# Patient Record
Sex: Female | Born: 1950 | Race: White | Hispanic: No | Marital: Married | Smoking: Former smoker
Health system: Southern US, Community
[De-identification: ages and names within clinical notes are randomized; demographics above are authoritative.]

## PROBLEM LIST (undated history)

## (undated) DIAGNOSIS — M545 Low back pain, unspecified: Secondary | ICD-10-CM

## (undated) DIAGNOSIS — F419 Anxiety disorder, unspecified: Secondary | ICD-10-CM

## (undated) DIAGNOSIS — K219 Gastro-esophageal reflux disease without esophagitis: Secondary | ICD-10-CM

## (undated) DIAGNOSIS — J449 Chronic obstructive pulmonary disease, unspecified: Secondary | ICD-10-CM

## (undated) DIAGNOSIS — I10 Essential (primary) hypertension: Secondary | ICD-10-CM

## (undated) DIAGNOSIS — F329 Major depressive disorder, single episode, unspecified: Secondary | ICD-10-CM

## (undated) DIAGNOSIS — F32A Depression, unspecified: Secondary | ICD-10-CM

## (undated) HISTORY — DX: Low back pain, unspecified: M54.50

## (undated) HISTORY — DX: Essential (primary) hypertension: I10

## (undated) HISTORY — DX: Chronic obstructive pulmonary disease, unspecified: J44.9

## (undated) HISTORY — DX: Low back pain: M54.5

---

## 2008-11-24 ENCOUNTER — Inpatient Hospital Stay (HOSPITAL_COMMUNITY): Admission: EM | Admit: 2008-11-24 | Discharge: 2008-11-30 | Payer: Self-pay | Admitting: Emergency Medicine

## 2009-12-02 DIAGNOSIS — J4489 Other specified chronic obstructive pulmonary disease: Secondary | ICD-10-CM | POA: Insufficient documentation

## 2009-12-02 DIAGNOSIS — R0989 Other specified symptoms and signs involving the circulatory and respiratory systems: Secondary | ICD-10-CM

## 2009-12-02 DIAGNOSIS — J441 Chronic obstructive pulmonary disease with (acute) exacerbation: Secondary | ICD-10-CM

## 2009-12-02 DIAGNOSIS — J449 Chronic obstructive pulmonary disease, unspecified: Secondary | ICD-10-CM | POA: Insufficient documentation

## 2009-12-02 DIAGNOSIS — R0609 Other forms of dyspnea: Secondary | ICD-10-CM | POA: Insufficient documentation

## 2009-12-02 DIAGNOSIS — M545 Low back pain: Secondary | ICD-10-CM | POA: Insufficient documentation

## 2009-12-03 ENCOUNTER — Ambulatory Visit: Payer: Self-pay | Admitting: Internal Medicine

## 2009-12-03 ENCOUNTER — Encounter: Payer: Self-pay | Admitting: Pulmonary Disease

## 2009-12-03 DIAGNOSIS — I10 Essential (primary) hypertension: Secondary | ICD-10-CM

## 2009-12-13 ENCOUNTER — Telehealth (INDEPENDENT_AMBULATORY_CARE_PROVIDER_SITE_OTHER): Payer: Self-pay | Admitting: *Deleted

## 2009-12-17 ENCOUNTER — Encounter: Payer: Self-pay | Admitting: Adult Health

## 2009-12-17 ENCOUNTER — Ambulatory Visit: Payer: Self-pay | Admitting: Internal Medicine

## 2010-01-03 ENCOUNTER — Telehealth (INDEPENDENT_AMBULATORY_CARE_PROVIDER_SITE_OTHER): Payer: Self-pay | Admitting: *Deleted

## 2010-01-06 ENCOUNTER — Ambulatory Visit: Payer: Self-pay | Admitting: Internal Medicine

## 2010-01-20 ENCOUNTER — Ambulatory Visit: Payer: Self-pay | Admitting: Internal Medicine

## 2010-01-25 ENCOUNTER — Telehealth (INDEPENDENT_AMBULATORY_CARE_PROVIDER_SITE_OTHER): Payer: Self-pay | Admitting: *Deleted

## 2010-02-17 ENCOUNTER — Ambulatory Visit: Payer: Self-pay | Admitting: Internal Medicine

## 2010-02-17 ENCOUNTER — Encounter: Payer: Self-pay | Admitting: Pulmonary Disease

## 2010-03-16 ENCOUNTER — Telehealth (INDEPENDENT_AMBULATORY_CARE_PROVIDER_SITE_OTHER): Payer: Self-pay | Admitting: *Deleted

## 2010-03-17 ENCOUNTER — Ambulatory Visit: Payer: Self-pay | Admitting: Internal Medicine

## 2010-04-04 ENCOUNTER — Ambulatory Visit: Payer: Self-pay | Admitting: Internal Medicine

## 2010-04-04 ENCOUNTER — Encounter: Payer: Self-pay | Admitting: Adult Health

## 2010-04-07 ENCOUNTER — Telehealth (INDEPENDENT_AMBULATORY_CARE_PROVIDER_SITE_OTHER): Payer: Self-pay | Admitting: *Deleted

## 2010-04-27 ENCOUNTER — Encounter (HOSPITAL_COMMUNITY)
Admission: RE | Admit: 2010-04-27 | Discharge: 2010-07-26 | Payer: Self-pay | Source: Home / Self Care | Attending: Internal Medicine | Admitting: Internal Medicine

## 2010-05-11 ENCOUNTER — Ambulatory Visit: Payer: Self-pay | Admitting: Internal Medicine

## 2010-05-11 DIAGNOSIS — J9610 Chronic respiratory failure, unspecified whether with hypoxia or hypercapnia: Secondary | ICD-10-CM

## 2010-06-22 ENCOUNTER — Ambulatory Visit: Payer: Self-pay | Admitting: Internal Medicine

## 2010-08-23 NOTE — Assessment & Plan Note (Signed)
Summary: Pulmonary/ ext f/u ov with mdi teaching   Copy to:  Dr. Warrick Parisian Primary Provider/Referring Provider:  Dr. Warrick Parisian  CC:  60 wk followup to review PFT's.  Pt states that her breathing is the same- no better or worse.  No new complaints today.Beth Meyers  History of Present Illness: 60 yowf quit smoking 1994 sob at time quit but much worse since.   Dec 03, 2009 cc doe x much worse x 60 years on 02 since 10/2009 assoc with cough and wheeze.  Better laying down. sometimes better with nebulizer, sometimes sob at rest assoc with lots of daytime coughing and heart burn despite PPI rx.  no purulent sputum. presently sob x room to room does not use 02 daytime.   Dec 17, 2009--Presents for follow up and med reivew. She is doing better. But cough is not gone yet.  Last visit CXR w/ no acute changes . Lisinopril and Spiriva stopped. Started on Benicar.  Started on prednisone 20mg  3 weeks ago for breathing problems. Has been out of work On average drink 12-18 beers , stopped last week now drinking non alcohol beer for last week 10-18 daily. We reviewed meds and organized into a med calendar.   January 20, 2010 cc t her breathing is the same- no better or worse.  No new complaints today. has trouble walking  back from out building to house due to sob. Pt denies any significant sore throat, dysphagia, itching, sneezing,  nasal congestion or excess secretions,  fever, chills, sweats, unintended wt loss, pleuritic or exertional cp, hempoptysis, change in activity tolerance  orthopnea pnd or leg swelling. Pt also denies any obvious fluctuation in symptoms with weather or environmental change or other alleviating or aggravating factors.       Current Medications (verified): 1)  Trazodone Hcl 150 Mg Tabs (Trazodone Hcl) .Beth Meyers.. 1 At Bedtime 2)  Calcium-Vitamin D 600-200 Mg-Unit Tabs (Calcium-Vitamin D) .Beth Meyers.. 1 Two Times A Day 3)  Vesicare 5 Mg Tabs (Solifenacin Succinate) .Beth Meyers.. 1 Once Daily 4)  Omeprazole  20 Mg Cpdr (Omeprazole) .... Take One 30-60 Min Before First Meal of The Day 5)  Oxygen 2 Lpm .... At Bedtime 6)  Ipratropium-Albuterol 0.5-2.5 (3) Mg/55ml Soln (Ipratropium-Albuterol) .... Inhale 1 Vial Via [facility]hn Every 6 Hours As Needed 7)  Mucinex Dm 30-600 Mg Xr12h-Tab (Dextromethorphan-Guaifenesin) .... Take 1-2 Tablets Every 12 Hours As Needed  Allergies (verified): 1)  ! Pcn  Past History:  Past Medical History: LUMBAGO (ICD-724.2) CHRONIC OBSTRUCTIVE PULMONARY DISEASE (ICD-496)     - PFT's ? date FEV1 .55 (20%) ratio 54 and 29% improvement after B2     - PFT's January 20, 2010 FEV1 0.92 (45%) ratio 53 DLC0 41 > 82% HYPERTENSION     - ACE d/c  12/03/09 > no better January 20, 2010   Family History: Reviewed history from 12/03/2009 and no changes required. Emphysema- Brothers Asthma- Brothers Heart dz- Father and Brothers  Social History: Reviewed history from 12/03/2009 and no changes required. Patient states former smoker. Quit in 1994.  Smoked approx 30 yrs up to 3 ppd. ETOH- daily Married Location manager  Vital Signs:  Patient profile:   60 year old female Weight:      157 pounds O2 Sat:      94 % on Room air Temp:     98.1 degrees F oral Pulse rate:   67 / minute BP sitting:   134 / 82  (left arm)  Vitals Entered By:  Vernie Murders (January 20, 2010 8:44 AM)  O2 Flow:  Room air  Physical Exam  Additional Exam:   January 20, 2010 wt 150 Dec 03, 2009>>156 12/17/09 >157 January 20, 2010  amb depressed wf with classic voice fatigue and pseudowheeze improves with purse lip maneuver  HEENT mild turbinate edema.  Oropharynx no thrush or excess pnd or cobblestoning.  No JVD or cervical adenopathy. Mild accessory muscle hypertrophy. Trachea midline, nl thryroid. Chest was hyperinflated by percussion with diminished breath sounds and moderate increased exp time without wheeze. Hoover sign positive at mid inspiration. Regular rate and rhythm without murmur gallop or rub or increase P2 or  edema.  Abd: no hsm, nl excursion. Ext warm without cyanosis or clubbing.     Impression & Recommendations:  Problem # 1:  CHRONIC OBSTRUCTIVE PULMONARY DISEASE (ICD-496) GOLD III with symptoms disporportionate to objective findings    DDX of  difficult airways managment all start with A and  include Adherence, Ace Inhibitors, Acid Reflux, Active Sinus Disease, Alpha 1 Antitripsin deficiency, Anxiety masquerading as Airways dz,  ABPA,  allergy(esp in young), Aspiration (esp in elderly), Adverse effects of DPI,  Active smokers, plus one B  = Beta blocker use..    Adherence better though not using the med calendar provided.   Each maintenance medication was reviewed in detail including most importantly the difference between maintenance and as needed and under what circumstances the prns are to be used. This was done in the context of a medication calendar review which provided the patient with a user-friendly unambiguous mechanism for medication administration and reconciliation and provides an action plan for all active problems. It is critical that this be shown to every doctor  for modification during the office visit if necessary so the patient can use it as a working document.  Next step is add spiriva:  I spent extra time with the patient today explaining optimal mdi/dpi  technique.  This improved from  50-90%  Medications Added to Medication List This Visit: 1)  Spiriva Handihaler 18 Mcg Caps (Tiotropium bromide monohydrate) .... Two puffs in handihaler daily  Other Orders: Est. Patient Level IV (43329)  Patient Instructions: 1)  I think of spiriva in this setting like purchasing high octane fuel for an older car with lots of miles on it. It may help the perfomance enough to warrant the purchase, but it won't change the longevity of the car or make it any easier parking it. It should improve peak performance  2)  Spiriva 1 capsule each am on a trial basis 3)  Please schedule a  follow-up appointment in 4 weeks, sooner if needed  Prescriptions: SPIRIVA HANDIHALER 18 MCG  CAPS (TIOTROPIUM BROMIDE MONOHYDRATE) Two puffs in handihaler daily  #30 x 11   Entered and Authorized by:   Nyoka Cowden MD   Signed by:   Nyoka Cowden MD on 01/20/2010   Method used:   Electronically to        [facility] [phone]* (retail)       [address]       [place], [place]  [address]       Ph: [phone] or [phone]       Fax: [phone]   RxID:   [id number]

## 2010-08-23 NOTE — Assessment & Plan Note (Signed)
Summary: Pulmonary/ ext ov with hfa 75% and walking sats =  ok on 2lpm   Copy to:  Dr. Warrick Parisian Primary Provider/Referring Provider:  Dr. Warrick Parisian  CC:  DOE- the same.  History of Present Illness: 16 yowf quit smoking 1994 sob at time quit but much worse since.   Dec 03, 2009 cc doe x much worse x 2 years on 02 since 10/2009 assoc with cough and wheeze.  Better laying down. sometimes better with nebulizer, sometimes sob at rest assoc with lots of daytime coughing and heart burn despite PPI rx.  no purulent sputum. presently sob x room to room does not use 02 daytime.   Dec 17, 2009--Presents for follow up and med reivew. She is doing better. But cough is not gone yet.  Last visit CXR w/ no acute changes . Lisinopril and Spiriva stopped. Started on Benicar.  Started on prednisone 20mg  3 weeks ago for breathing problems. Has been out of work On average drink 12-18 beers , stopped last week now drinking non alcohol beer for last week 10-18 daily. We reviewed meds and organized into a med calendar.   January 20, 2010 cc t her breathing is the same- no better or worse.  No new complaints today. has trouble walking  back from out building to house due to sob.    FEV1 .92 >  added  spiriva    March 17, 2010 4 wk followup.  Pt states that her breathing is the same since last seen- no better or worse.   She states that her cough is worse over the past 1 wk- prod with white/clear clear sputum.  States that she wakes up sometimes in the night with coughing spells- sometimes to the point where she gags. Confused with instructions on maint vs prns>>Rx Symbicort, and stopped duoneb>changed to xopenex as needed    April 04, 2010--Presents for follow up and med review. Unfortunately only brought Med calendar. We reviewd this and updated a new list. She is feelimg better w/ decreaesd cough and wheeizng. Last visit symbicort added. She does continue to wear easily with minimal activity. Waiting  for pulmonary rehab >  started it 04/2010 but desats with ex  May 11, 2010 cc doe same, says sats are dropping with ex  and requesting amb 02 . overall ex tol improving. Pt denies any significant sore throat, dysphagia, itching, sneezing,  nasal congestion or excess secretions,  fever, chills, sweats, unintended wt loss, pleuritic or exertional cp, hempoptysis, change in activity tolerance  orthopnea pnd or leg swelling. Pt also denies any obvious fluctuation in symptoms with weather or environmental change or other alleviating or aggravating factors.     using xopenex maybe once per day, never at night with variable benefit on breathing and cough  Current Medications (verified): 1)  Citalopram Hydrobromide 40 Mg Tabs (Citalopram Hydrobromide) .... Take 1 Tablet By Mouth Once A Day 2)  Trazodone Hcl 150 Mg Tabs (Trazodone Hcl) .Marland Kitchen.. 1 At Bedtime 3)  Vitamin B-12 1000 Mcg Tabs (Cyanocobalamin) .... Take 1 Tablet By Mouth Once A Day 4)  Calcium-Vitamin D 600-200 Mg-Unit Tabs (Calcium-Vitamin D) .Marland Kitchen.. 1 Two Times A Day 5)  Vesicare 5 Mg Tabs (Solifenacin Succinate) .Marland Kitchen.. 1 Once Daily 6)  Benicar 20 Mg Tabs (Olmesartan Medoxomil) .Marland Kitchen.. 1 By Mouth Once Daily 7)  Omeprazole 20 Mg Cpdr (Omeprazole) .... Take One 30-60 Min Before First Meal of The Day 8)  Spiriva Handihaler 18 Mcg  Caps (Tiotropium Bromide  Monohydrate) .... Two Puffs in Handihaler Daily 9)  Oxygen 2 Lpm .... At Bedtime 10)  Symbicort 160-4.5 Mcg/act  Aero (Budesonide-Formoterol Fumarate) .... 2 Puffs First Thing  in Am and 2 Puffs Again in Pm About 12 Hours Later 11)  Tricor 145 Mg Tabs (Fenofibrate) .... Take 1 Tablet By Mouth Once A Day 12)  Xopenex 1.25 Mg/53ml Nebu (Levalbuterol Hcl) .Marland Kitchen.. 1 Vial in [employer] Every 4 Hours As Needed Wheezing/shortness of Breath 13)  Mucinex Dm 30-600 Mg Xr12h-Tab (Dextromethorphan-Guaifenesin) .... Take 1-2 Tablets Every 12 Hours As Needed 14)  Klonopin 0.5 Mg Tabs (Clonazepam) .... 1/2 - 1 Every 6  Hours As Needed For Nerves  Allergies (verified): 1)  ! Pcn  Past History:  Past Medical History: LUMBAGO (ICD-724.2) CHRONIC OBSTRUCTIVE PULMONARY DISEASE (ICD-496)     - PFT's ? date FEV1 .55 (20%) ratio 54 and 29% improvement after B2     - PFT's January 20, 2010 FEV1 0.92 (45%) ratio 53 DLC0 41 > 82%     -02/17/10  refer to pulm rehab     - HFA 50% March 18, 2010 > 75% May 11, 2010  HYPERTENSION     - ACE d/c  12/03/09 > no better January 20, 2010  COMPLEX MED REGIMEN -----Med  review/ pt education and computerized med calendar April 04, 2010 but did not bring meds     Vital Signs:  Patient profile:   60 year old female Weight:      157.13 pounds O2 Sat:      92 % on Room air Temp:     98.2 degrees F oral Pulse rate:   72 / minute BP sitting:   142 / 90  (left arm)  Vitals Entered By: Vernie Murders (May 11, 2010 9:20 AM)  O2 Flow:  Room air  Serial Vital Signs/Assessments:  Comments: Ambulatory Pulse Oximetry  Resting; HR__83___    02 Sat_94%RA____  Lap1 (185 feet)   HR__89___   02 Sat__87%RA___ Lap2 (185 feet)   HR__87___   02 Sat__96%2 liters___    Lap3 (185 feet)   HR__87___   02 Sat__95% 2 liters___  _x__Test Completed without Difficulty Zackery Barefoot CMA  May 11, 2010 10:11 AM  ___Test Stopped due to:   By: Zackery Barefoot CMA    Physical Exam  Additional Exam:  wt 150 Dec 03, 2009>160 02/17/10 > 165 March 18, 2010 >>162 April 04, 2010> 157 May 11, 2010  depressed appearing amb wf nad  HEENT mild turbinate edema.  Oropharynx no thrush or excess pnd or cobblestoning.  No JVD or cervical adenopathy. Mild accessory muscle hypertrophy. Trachea midline, nl thryroid. Chest was hyperinflated by percussion with diminished breath sounds and moderate increased exp time without wheeze.  Regular rate and rhythm without murmur gallop or rub or increase P2 or edema.  Abd: no hsm, nl excursion. Ext warm without cyanosis or clubbing.      Impression & Recommendations:  Problem # 1:  CHRONIC OBSTRUCTIVE PULMONARY DISEASE (ICD-496) GOLD III    DDX of  difficult airways managment all start with A and  include Adherence, Ace Inhibitors, Acid Reflux, Active Sinus Disease, Alpha 1 Antitripsin deficiency, Anxiety masquerading as Airways dz,  ABPA,  allergy(esp in young), Aspiration (esp in elderly), Adverse effects of DPI,  Active smokers, plus one B  = Beta blocker use..    Adherence biggest obstacle apparent here.  - I spent extra time with the patient today explaining optimal mdi  technique.  This improved from  50-75% -  Each maintenance medication was reviewed in detail including most importantly the difference between maintenance and as needed and under what circumstances the prns are to be used. This was done in the context of a medication calendar review which provided the patient with a user-friendly unambiguous mechanism for medication administration and reconciliation and provides an action plan for all active problems. It is critical that this be shown to every doctor  for modification during the office visit if necessary so the patient can use it as a working document.   Problem # 2:  RESPIRATORY FAILURE, CHRONIC (ICD-518.83) see 02 sats, needs 02  2lpm with planned activity > slow adls or room to room walking  Medications Added to Medication List This Visit: 1)  Oxygen 2 Lpm  .... At bedtime and with any more than room to room walking 2)  Xopenex Hfa 45 Mcg/act Aero (Levalbuterol tartrate) .... 2 puffs every 4 hours if needed  Other Orders: Est. Patient Level IV (99214) Pulse Oximetry, Ambulatory (10272) Misc. Referral (Misc. Ref)  Patient Instructions: 1)  Work on inhaler technique:  relax and blow all the way out then take a nice smooth deep breath back in, triggering the inhaler at same time you start breathing in  2)  See Tammy NP w/in 6 weeks with all your medications, even over the counter meds, separated  in two separate bags, the ones you take no matter what vs the ones you stop once you feel better and take only as needed.  She will generate for you a new user friendly medication calendar that will put [place] all on the same page re: your medication use.  3)  New order for 02:  use it with any activty greater than walking w/in the house and also wear at bedtime Prescriptions: XOPENEX HFA 45 MCG/ACT AERO (LEVALBUTEROL TARTRATE) 2 puffs every 4 hours if needed  #1 x 11   Entered and Authorized by:   Nyoka Cowden MD   Signed by:   Nyoka Cowden MD on 05/11/2010   Method used:   Print then Give to Patient   RxID:   [id number]

## 2010-08-23 NOTE — Letter (Signed)
Summary: Out of Work  [employer] [employer]  [address]   [place], [place] [address]   Phone: [phone]  Fax: [phone]    February 17, 2010   Employee:  Neka Bigbee    To Whom It May Concern:   For Medical reasons, please excuse the above named employee from work for the following dates:  Start:   02-17-2010  End:   03-17-2010  If you need additional information, please feel free to contact our office.         Sincerely,      Rubye Oaks, NP

## 2010-08-23 NOTE — Miscellaneous (Signed)
Summary: Orders Update pft charges  Clinical Lists Changes  Orders: Added new Service order of Carbon Monoxide diffusing w/capacity (94720) - Signed Added new Service order of Lung Volumes (94240) - Signed Added new Service order of Spirometry (Pre & Post) (94060) - Signed 

## 2010-08-23 NOTE — Letter (Signed)
Summary: Out of Work  [facility]  [address]   [place], [place] [address]   Phone: [phone]  Fax: [phone]    March 17, 2010   Employee:  Beth Meyers    To Whom It May Concern:   For Medical reasons, please excuse the above named employee from work for the following dates:  Start:   11/01/09  End:   03/31/10  If you need additional information, please feel free to contact our office.         Sincerely,    Sandrea Hughs, MD

## 2010-08-23 NOTE — Letter (Signed)
Summary: Out of Work  [facility]  [address]   [place], [place] [address]   Phone: [phone]  Fax: [phone]    Dec 03, 2009   Employee:  Beth Meyers    To Whom It May Concern:   For Medical reasons, please excuse the above named employee from work for the following dates:  Start:   12-03-2009  End:   12-17-2009  If you need additional information, please feel free to contact our office.         Sincerely,      Sandrea Hughs, M . D.

## 2010-08-23 NOTE — Progress Notes (Signed)
Summary: Medication Calendar/[facility]  Medication Calendar/[facility]   Imported By: Sherian Rein 06/27/2010 13:46:38  _____________________________________________________________________  External Attachment:    Type:   Image     Comment:   External Document

## 2010-08-23 NOTE — Assessment & Plan Note (Signed)
Summary: NP follow up - COPD   Copy to:  Dr. Warrick Parisian Primary Provider/Referring Provider:  Dr. Warrick Parisian  CC:  follow-up visit - increased SOB, prod cough with clear-to-green mucus, wheezing, and states no change since last OV.  History of Present Illness: 84 yowf quit smoking 1994 sob at time quit but much worse since.   Dec 03, 2009 cc doe x much worse x 2 years on 02 since 10/2009 assoc with cough and wheeze.  Better laying down. sometimes better with nebulizer, sometimes sob at rest assoc with lots of daytime coughing and heart burn despite PPI rx.  no purulent sputum. presently sob x room to room does not use 02 daytime.   Dec 17, 2009--Presents for follow up and med reivew. She is doing better. But cough is not gone yet.  Last visit CXR w/ no acute changes . Lisinopril and Spiriva stopped. Started on Benicar.  Started on prednisone 20mg  3 weeks ago for breathing problems. Has been out of work On average drink 12-18 beers , stopped last week now drinking non alcohol beer for last week 10-18 daily. We reviewed meds and organized into a med calendar.   January 20, 2010 cc t her breathing is the same- no better or worse.  No new complaints today. has trouble walking  back from out building to house due to sob.    February 17, 2010 --Presents for work in visit. Complains of  increased SOB, prod cough with clear-to-green mucus, wheezing,  Not feeling well for last couple of days. Denies chest pain,  orthopnea, hemoptysis, fever, n/v/d, edema, headache/. OTC not helping. She continues to be weak, wears out easily. Walks short distance and has to stop and rest. We discussed pulmonary rehab.   Medications Prior to Update: 1)  Trazodone Hcl 150 Mg Tabs (Trazodone Hcl) .Marland Kitchen.. 1 At Bedtime 2)  Calcium-Vitamin D 600-200 Mg-Unit Tabs (Calcium-Vitamin D) .Marland Kitchen.. 1 Two Times A Day 3)  Vesicare 5 Mg Tabs (Solifenacin Succinate) .Marland Kitchen.. 1 Once Daily 4)  Omeprazole 20 Mg Cpdr (Omeprazole) .... Take One  30-60 Min Before First Meal of The Day 5)  Oxygen 2 Lpm .... At Bedtime 6)  Spiriva Handihaler 18 Mcg  Caps (Tiotropium Bromide Monohydrate) .... Two Puffs in Handihaler Daily 7)  Ipratropium-Albuterol 0.5-2.5 (3) Mg/53ml Soln (Ipratropium-Albuterol) .... Inhale 1 Vial Via Hhn Every 6 Hours As Needed 8)  Mucinex Dm 30-600 Mg Xr12h-Tab (Dextromethorphan-Guaifenesin) .... Take 1-2 Tablets Every 12 Hours As Needed 9)  Benicar 20 Mg Tabs (Olmesartan Medoxomil) .Marland Kitchen.. 1 By Mouth Once Daily  Current Medications (verified): 1)  Trazodone Hcl 150 Mg Tabs (Trazodone Hcl) .Marland Kitchen.. 1 At Bedtime 2)  Calcium-Vitamin D 600-200 Mg-Unit Tabs (Calcium-Vitamin D) .Marland Kitchen.. 1 Two Times A Day 3)  Vesicare 5 Mg Tabs (Solifenacin Succinate) .Marland Kitchen.. 1 Once Daily 4)  Omeprazole 20 Mg Cpdr (Omeprazole) .... Take One 30-60 Min Before First Meal of The Day 5)  Oxygen 2 Lpm .... At Bedtime 6)  Spiriva Handihaler 18 Mcg  Caps (Tiotropium Bromide Monohydrate) .... Two Puffs in Handihaler Daily 7)  Ipratropium-Albuterol 0.5-2.5 (3) Mg/76ml Soln (Ipratropium-Albuterol) .... Inhale 1 Vial Via Hhn Every 6 Hours As Needed 8)  Mucinex Dm 30-600 Mg Xr12h-Tab (Dextromethorphan-Guaifenesin) .... Take 1-2 Tablets Every 12 Hours As Needed 9)  Benicar 20 Mg Tabs (Olmesartan Medoxomil) .Marland Kitchen.. 1 By Mouth Once Daily 10)  Citalopram Hydrobromide 40 Mg Tabs (Citalopram Hydrobromide) .... Take 1 Tablet By Mouth Once A Day 11)  Tricor 145  Mg Tabs (Fenofibrate) .... Take 1 Tablet By Mouth Once A Day  Allergies (verified): 1)  ! Pcn  Past History:  Family History: Last updated: 12/03/2009 Emphysema- Brothers Asthma- Brothers Heart dz- Father and Brothers  Social History: Last updated: 12/03/2009 Patient states former smoker. Quit in 1994.  Smoked approx 30 yrs up to 3 ppd. ETOH- daily Married Location manager  Risk Factors: Smoking Status: quit (12/02/2009)  Past Medical History: LUMBAGO (ICD-724.2) CHRONIC OBSTRUCTIVE PULMONARY DISEASE  (ICD-496)     - PFT's ? date FEV1 .55 (20%) ratio 54 and 29% improvement after B2     - PFT's January 20, 2010 FEV1 0.92 (45%) ratio 53 DLC0 41 > 82% 7/28 refer to pulm rehab HYPERTENSION     - ACE d/c  12/03/09 > no better January 20, 2010   Review of Systems      See HPI  Vital Signs:  Patient profile:   60 year old female Height:      62 inches Weight:      160.38 pounds BMI:     29.44 O2 Sat:      91 % on Room air Temp:     100 degrees F oral Pulse rate:   94 / minute BP sitting:   128 / 88  (left arm) Cuff size:   regular  Vitals Entered By: Boone Master CNA/MA (February 17, 2010 10:41 AM)  O2 Flow:  Room air CC: follow-up visit - increased SOB, prod cough with clear-to-green mucus, wheezing, states no change since last OV Is Patient Diabetic? No Comments Medications reviewed with patient Daytime contact number verified with patient. Boone Master CNA/MA  February 17, 2010 10:39 AM    Physical Exam  Additional Exam:   January 20, 2010 wt 150 Dec 03, 2009>>156 12/17/09 >157 January 20, 2010 >160 02/17/10    HEENT mild turbinate edema.  Oropharynx no thrush or excess pnd or cobblestoning.  No JVD or cervical adenopathy. Mild accessory muscle hypertrophy. Trachea midline, nl thryroid. Chest was hyperinflated by percussion with diminished breath sounds and moderate increased exp time without wheeze. Hoover sign positive at mid inspiration. Regular rate and rhythm without murmur gallop or rub or increase P2 or edema.  Abd: no hsm, nl excursion. Ext warm without cyanosis or clubbing.     Impression & Recommendations:  Problem # 1:  BRONCHITIS, CHRONIC, ACUTE EXACERBATION (ICD-491.21)  REC:  Doxycline 100mg  two times a day for 7 days w/ food.  Mucinex DM  two times a day for cough/congestion Continue on Spiriva  daily.  follow up Dr. Sherene Sires in 4 weeks.   Orders: Est. Patient Level III (38182)  Problem # 2:  CHRONIC OBSTRUCTIVE PULMONARY DISEASE (ICD-496) refer to pulmonary rehab    Medications Added to Medication List This Visit: 1)  Citalopram Hydrobromide 40 Mg Tabs (Citalopram hydrobromide) .... Take 1 tablet by mouth once a day 2)  Tricor 145 Mg Tabs (Fenofibrate) .... Take 1 tablet by mouth once a day 3)  Doxycycline Hyclate 100 Mg Caps (Doxycycline hyclate) .Marland Kitchen.. 1 by mouth two times a day  Complete Medication List: 1)  Trazodone Hcl 150 Mg Tabs (Trazodone hcl) .Marland Kitchen.. 1 at bedtime 2)  Calcium-vitamin D 600-200 Mg-unit Tabs (Calcium-vitamin d) .Marland Kitchen.. 1 two times a day 3)  Vesicare 5 Mg Tabs (Solifenacin succinate) .Marland Kitchen.. 1 once daily 4)  Omeprazole 20 Mg Cpdr (Omeprazole) .... Take one 30-60 min before first meal of the day 5)  Oxygen 2 Lpm  .... At bedtime  6)  Spiriva Handihaler 18 Mcg Caps (Tiotropium bromide monohydrate) .... Two puffs in handihaler daily 7)  Ipratropium-albuterol 0.5-2.5 (3) Mg/15ml Soln (Ipratropium-albuterol) .... Inhale 1 vial via hhn every 6 hours as needed 8)  Mucinex Dm 30-600 Mg Xr12h-tab (Dextromethorphan-guaifenesin) .... Take 1-2 tablets every 12 hours as needed 9)  Benicar 20 Mg Tabs (Olmesartan medoxomil) .Marland Kitchen.. 1 by mouth once daily 10)  Citalopram Hydrobromide 40 Mg Tabs (Citalopram hydrobromide) .... Take 1 tablet by mouth once a day 11)  Tricor 145 Mg Tabs (Fenofibrate) .... Take 1 tablet by mouth once a day 12)  Doxycycline Hyclate 100 Mg Caps (Doxycycline hyclate) .Marland Kitchen.. 1 by mouth two times a day  Other Orders: Pulmonary Referral (Pulmonary)  Patient Instructions: 1)  Doxycline 100mg  two times a day for 7 days w/ food.  2)  Mucinex DM  two times a day for cough/congestion 3)  Continue on Spiriva  daily.  4)  follow up Dr. Sherene Sires in 4 weeks.  Prescriptions: DOXYCYCLINE HYCLATE 100 MG CAPS (DOXYCYCLINE HYCLATE) 1 by mouth two times a day  #14 x 0   Entered and Authorized by:   Rubye Oaks NP   Signed by:   Rubye Oaks NP on 02/17/2010   Method used:   Electronically to        [facility] [phone][facility] (retail)       [address]       [place], [place]  [address]       Ph: [phone] or [phone]       Fax: [phone]   RxID:   [id number] BENICAR 20 MG TABS (OLMESARTAN MEDOXOMIL) 1 by mouth once daily  #90 x 3   Entered and Authorized by:   Rubye Oaks NP   Signed by:   Bennye Nix NP on 02/17/2010   Method used:   Faxed to ...       [facility])             , [place]         Ph:        Fax: [phone]   RxID:   [id number] SPIRIVA HANDIHALER 18 MCG  CAPS (TIOTROPIUM BROMIDE MONOHYDRATE) Two puffs in handihaler daily  #90 x 3   Entered and Authorized by:   Rubye Oaks NP   Signed by:   Nazir Hacker NP on 02/17/2010   Method used:   Faxed to ...       [facility])             , [place]         Ph:        Fax: [phone]   RxID:   [id number]    Immunization History:  Influenza Immunization History:    Influenza:  historical (05/24/2009)

## 2010-08-23 NOTE — Progress Notes (Signed)
  Phone Note Other Incoming   Request: Send information Summary of Call: Request for records received from [employer]. Request forwarded to [facility].     

## 2010-08-23 NOTE — Letter (Signed)
Summary: [employer]  [address]   [place], [place] [address]   Phone: [phone]  Fax: [phone]    Today's Date: Dec 17, 2009  Name of Patient: Beth Meyers  The above named patient had a medical visit today at:  3:00 am / pm.  Please take this into consideration when reviewing the time away from work/school.    Special Instructions:  [  ] None  [  ] To be off the remainder of today, returning to the normal work / school schedule tomorrow.  [ X ] To be off until the next scheduled appointment on ______________________.  [  ] Other ________________________________________________________________ ________________________________________________________________________   Sincerely yours,      Toryn Dewalt, N.P.

## 2010-08-23 NOTE — Letter (Signed)
Summary: Out of Work  [facility]  [address]   [place], [place] [address]   Phone: [phone]  Fax: [phone]    March 17, 2010   Employee:  Faigy Saintil    To Whom It May Concern:   For Medical reasons, please excuse the above named employee from work for the following dates:  Start:   11/01/09  End:   04/04/10  If you need additional information, please feel free to contact our office.         Sincerely,    Dorcas Carrow, MD

## 2010-08-23 NOTE — Progress Notes (Signed)
  Phone Note Other Incoming   Request: Send information Summary of Call: Request for records received from [employer]. Request forwarded to [facility].     

## 2010-08-23 NOTE — Assessment & Plan Note (Signed)
Summary: NP follow up - med calendar   Copy to:  Dr. Warrick Parisian Primary Provider/Referring Provider:  Dr. Warrick Parisian  CC:  med review .  History of Present Illness: 54 yowf quit smoking 1994 sob at time quit but much worse since.   Dec 03, 2009 cc doe x much worse x 2 years on 02 since 10/2009 assoc with cough and wheeze.  Better laying down. sometimes better with nebulizer, sometimes sob at rest assoc with lots of daytime coughing and heart burn despite PPI rx.  no purulent sputum. presently sob x room to room does not use 02 daytime.   Dec 17, 2009--Presents for follow up and med reivew. She is doing better. But cough is not gone yet.  Last visit CXR w/ no acute changes . Lisinopril and Spiriva stopped. Started on Benicar.  Started on prednisone 20mg  3 weeks ago for breathing problems. Has been out of work On average drink 12-18 beers , stopped last week now drinking non alcohol beer for last week 10-18 daily. We reviewed meds and organized into a med calendar. Denies chest pain,  orthopnea, hemoptysis, fever, n/v/d, edema, headache.   Medications Prior to Update: 1)  Citalopram Hydrobromide 40 Mg Tabs (Citalopram Hydrobromide) .Marland Kitchen.. 1 Once Daily 2)  Trazodone Hcl 150 Mg Tabs (Trazodone Hcl) .Marland Kitchen.. 1 At Bedtime 3)  Vitamin B-12 1000 Mcg Tabs (Cyanocobalamin) .Marland Kitchen.. 1 Once Daily 4)  Calcium-Vitamin D 600-200 Mg-Unit Tabs (Calcium-Vitamin D) .Marland Kitchen.. 1 Two Times A Day 5)  Vesicare 5 Mg Tabs (Solifenacin Succinate) .Marland Kitchen.. 1 Once Daily 6)  Benicar 20 Mg  Tabs (Olmesartan Medoxomil) .... One Tablet By Mouth Daily 7)  Omeprazole 20 Mg Cpdr (Omeprazole) .... Take One 30-60 Min Before First and Last Meals of The Day 8)  Prednisone 20 Mg Tabs (Prednisone) .... 1/2 Two Times A Day 9)  Oxygen 2 Lpm .... At Bedtime 10)  Clonazepam 0.5 Mg Tabs (Clonazepam) .... 1/2 To 1 Two Times A Day As Needed 11)  Tricor 145 Mg Tabs (Fenofibrate) .Marland Kitchen.. 1 Once Daily 12)  Vitamin D3 2000 Unit Caps  (Cholecalciferol) .Marland Kitchen.. 1 Once Daily  Current Medications (verified): 1)  Citalopram Hydrobromide 40 Mg Tabs (Citalopram Hydrobromide) .Marland Kitchen.. 1 Once Daily 2)  Trazodone Hcl 150 Mg Tabs (Trazodone Hcl) .Marland Kitchen.. 1 At Bedtime 3)  Vitamin B-12 1000 Mcg Tabs (Cyanocobalamin) .Marland Kitchen.. 1 Once Daily 4)  Calcium-Vitamin D 600-200 Mg-Unit Tabs (Calcium-Vitamin D) .Marland Kitchen.. 1 Two Times A Day 5)  Vesicare 5 Mg Tabs (Solifenacin Succinate) .Marland Kitchen.. 1 Once Daily 6)  Benicar 20 Mg  Tabs (Olmesartan Medoxomil) .... One Tablet By Mouth Daily 7)  Omeprazole 20 Mg Cpdr (Omeprazole) .... Take One 30-60 Min Before First Meal of The Day 8)  Prednisone 20 Mg Tabs (Prednisone) .... 1/2 Tab By Mouth Once Daily 9)  Oxygen 2 Lpm .... At Bedtime 10)  Ipratropium-Albuterol 0.5-2.5 (3) Mg/26ml Soln (Ipratropium-Albuterol) .... Inhale 1 Vial Via Hhn Every 6 Hours As Needed 11)  Mucinex Dm 30-600 Mg Xr12h-Tab (Dextromethorphan-Guaifenesin) .... Take 1-2 Tablets Every 12 Hours As Needed 12)  Clonazepam 0.5 Mg Tabs (Clonazepam) .... 1/2 Tab By Mouth Every 12 Hours As Needed  Allergies (verified): 1)  ! Pcn  Past History:  Past Medical History: Last updated: 12/03/2009 LUMBAGO (ICD-724.2) CHRONIC OBSTRUCTIVE PULMONARY DISEASE (ICD-496)     - PFT's ? date FEV1 .55 (20%) ratio 54 and 29% improvement after B2 HYPERTENSION     - ACE d/c  12/03/09   Family History: Last  updated: 12/03/2009 Emphysema- Brothers Asthma- Brothers Heart dz- Father and Brothers  Social History: Last updated: 12/03/2009 Patient states former smoker. Quit in 1994.  Smoked approx 30 yrs up to 3 ppd. 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 No Comments Medications reviewed with patient Daytime contact number verified with patient. Boone Master CNA/MA  Dec 17, 2009 2:48 PM    Physical Exam  Additional Exam:  wt 150 Dec 03, 2009>>156 12/17/09 amb depressed wf with classic voice fatigue and pseudowheeze improves with purse lip maneuver  HEENT mild turbinate edema.  Oropharynx no thrush or excess pnd or cobblestoning.  No JVD or cervical adenopathy. Mild accessory muscle hypertrophy. Trachea midline, nl thryroid. Chest was hyperinflated by percussion with diminished breath sounds and moderate increased exp time without wheeze. Hoover sign positive at mid inspiration. Regular rate and rhythm without murmur gallop or rub or increase P2 or edema.  Abd: no hsm, nl excursion. Ext warm without cyanosis or clubbing.     Impression & Recommendations:  Problem # 1:  CHRONIC OBSTRUCTIVE PULMONARY DISEASE (ICD-496) Improved on present regimen.  Meds reviewed with pt education and computerized med calendar complete REC:  Decrease Prednisone 20mg  1/2 once daily  Stop BC powders Follow med calendar closely and bring to each visit.  Out of work until seen back in office w/ Dr. Sherene Sires in 3 weeks w/  PFTs  Please contact office for sooner follow up if symptoms do not improve or worsen  .  Medications Added to Medication List This Visit: 1)  Omeprazole 20 Mg Cpdr (Omeprazole) .... Take one 30-60 min before first meal of the day 2)  Prednisone 20 Mg Tabs (Prednisone) .... 1/2 tab by mouth once daily 3)  Ipratropium-albuterol 0.5-2.5 (3) Mg/72ml Soln (Ipratropium-albuterol) .... Inhale 1 vial via hhn every 6 hours as needed 4)  Mucinex Dm 30-600 Mg Xr12h-tab (Dextromethorphan-guaifenesin) .... Take 1-2 tablets every 12 hours as needed 5)  Clonazepam 0.5 Mg Tabs (Clonazepam) .... 1/2 tab by mouth  every 12 hours as needed  Complete Medication List: 1)  Citalopram Hydrobromide 40 Mg Tabs (Citalopram hydrobromide) .Marland Kitchen.. 1 once daily 2)  Trazodone Hcl 150 Mg Tabs (Trazodone hcl) .Marland Kitchen.. 1 at bedtime 3)  Vitamin B-12 1000 Mcg Tabs (Cyanocobalamin) .Marland Kitchen.. 1 once daily 4)  Calcium-vitamin D 600-200 Mg-unit Tabs (Calcium-vitamin d) .Marland Kitchen.. 1 two times a day 5)  Vesicare 5 Mg Tabs (Solifenacin succinate) .Marland Kitchen.. 1 once daily 6)  Benicar 20 Mg Tabs (Olmesartan medoxomil) .... One tablet by mouth daily 7)  Omeprazole 20 Mg Cpdr (Omeprazole) .... Take one 30-60 min before first meal of the day 8)  Prednisone 20 Mg Tabs (Prednisone) .... 1/2 tab by mouth once daily 9)  Oxygen 2 Lpm  .... At bedtime 10)  Ipratropium-albuterol 0.5-2.5 (3) Mg/14ml Soln (Ipratropium-albuterol) .Marland KitchenMarland KitchenMarland Kitchen  Inhale 1 vial via hhn every 6 hours as needed 11)  Mucinex Dm 30-600 Mg Xr12h-tab (Dextromethorphan-guaifenesin) .... Take 1-2 tablets every 12 hours as needed 12)  Clonazepam 0.5 Mg Tabs (Clonazepam) .... 1/2 tab by mouth every 12 hours as needed  Other Orders: Est. Patient Level IV (08657)  Patient Instructions: 1)  Decrease Prednisone 20mg  1/2 once daily  2)  Stop BC powders 3)  Follow med calendar closely and bring to each visit.  4)  Out of work until seen back in office w/ Dr. Sherene Sires in 3 weeks w/  PFTs  5)  Please contact office for sooner follow up if symptoms do not improve or worsen  6)  .

## 2010-08-23 NOTE — Assessment & Plan Note (Signed)
Summary: NP follow up - med calendar   Copy to:  Dr. Warrick Parisian Primary Provider/Referring Provider:  Dr. Warrick Parisian  CC:  est med calendar - pt did not bring any meds with her today.  History of Present Illness: 78 yowf quit smoking 1994 sob at time quit but much worse since.   Dec 03, 2009 cc doe x much worse x 2 years on 02 since 10/2009 assoc with cough and wheeze.  Better laying down. sometimes better with nebulizer, sometimes sob at rest assoc with lots of daytime coughing and heart burn despite PPI rx.  no purulent sputum. presently sob x room to room does not use 02 daytime.   Dec 17, 2009--Presents for follow up and med reivew. She is doing better. But cough is not gone yet.  Last visit CXR w/ no acute changes . Lisinopril and Spiriva stopped. Started on Benicar.  Started on prednisone 20mg  3 weeks ago for breathing problems. Has been out of work On average drink 12-18 beers , stopped last week now drinking non alcohol beer for last week 10-18 daily. We reviewed meds and organized into a med calendar.   January 20, 2010 cc t her breathing is the same- no better or worse.  No new complaints today. has trouble walking  back from out building to house due to sob.    FEV1 .92 >  add spiriva  February 17, 2010 --Presents for work in visit. Complains of  increased SOB, prod cough with clear-to-green mucus, wheezing,  Not feeling well for last couple of days.  rx doxy  March 17, 2010 4 wk followup.  Pt states that her breathing is the same since last seen- no better or worse.   She states that her cough is worse over the past 1 wk- prod with white/clear clear sputum.  States that she wakes up sometimes in the night with coughing spells- sometimes to the point where she gags. Confused with instructions on maint vs prns>>Rx Symbicort, and stopped duoneb>changed to xopenex as needed     April 04, 2010--Presents for follow up and med review. Unfortunately only brought Med calendar. We  reviewd this and updated a new list. She is feelimg better w/ decreaesd cough and wheeizng. Last visit symbicort added. She does continue to wear easily with minimal activity. Waiting for pulmonary rehab referral appointment. Denies chest pain, orthopnea, hemoptysis, fever, n/v/d, edema, headache.    Medications Prior to Update: 1)  Citalopram Hydrobromide 40 Mg Tabs (Citalopram Hydrobromide) .... Take 1 Tablet By Mouth Once A Day 2)  Trazodone Hcl 150 Mg Tabs (Trazodone Hcl) .Marland Kitchen.. 1 At Bedtime 3)  Calcium-Vitamin D 600-200 Mg-Unit Tabs (Calcium-Vitamin D) .Marland Kitchen.. 1 Two Times A Day 4)  Vesicare 5 Mg Tabs (Solifenacin Succinate) .Marland Kitchen.. 1 Once Daily 5)  Benicar 20 Mg Tabs (Olmesartan Medoxomil) .Marland Kitchen.. 1 By Mouth Once Daily 6)  Omeprazole 20 Mg Cpdr (Omeprazole) .... Take One 30-60 Min Before First Meal of The Day 7)  Oxygen 2 Lpm .... At Bedtime 8)  Spiriva Handihaler 18 Mcg  Caps (Tiotropium Bromide Monohydrate) .... Two Puffs in Handihaler Daily 9)  Tricor 145 Mg Tabs (Fenofibrate) .... Take 1 Tablet By Mouth Once A Day 10)  Mucinex Dm 30-600 Mg Xr12h-Tab (Dextromethorphan-Guaifenesin) .... Take 1-2 Tablets Every 12 Hours As Needed 11)  Symbicort 160-4.5 Mcg/act  Aero (Budesonide-Formoterol Fumarate) .... 2 Puffs First Thing  in Am and 2 Puffs Again in Pm About 12 Hours Later 12)  Xopenex 1.25  Mg/20ml Nebu (Levalbuterol Hcl) .... One Every 6 Hours If Needed 13)  Klonopin 0.5 Mg Tabs (Clonazepam) .... One Every 6 Hours  Allergies (verified): 1)  ! Pcn  Past History:  Family History: Last updated: 12/03/2009 Emphysema- Brothers Asthma- Brothers Heart dz- Father and Brothers  Social History: Last updated: 12/03/2009 Patient states former smoker. Quit in 1994.  Smoked approx 30 yrs up to 3 ppd. ETOH- daily Married Location manager  Risk Factors: Smoking Status: quit (12/02/2009)  Past Medical History: LUMBAGO (ICD-724.2) CHRONIC OBSTRUCTIVE PULMONARY DISEASE (ICD-496)     - PFT's ? date  FEV1 .55 (20%) ratio 54 and 29% improvement after B2     - PFT's January 20, 2010 FEV1 0.92 (45%) ratio 53 DLC0 41 > 82%     -02/17/10  refer to pulm rehab     - HFA 50% March 18, 2010  HYPERTENSION     - ACE d/c  12/03/09 > no better January 20, 2010  COMPLEX MED REGIMEN -----Meds reviewed with pt education and computerized med calendar April 04, 2010   flu shot>> April 04, 2010   Review of Systems      See HPI  Vital Signs:  Patient profile:   60 year old female Height:      62 inches Weight:      162.25 pounds BMI:     29.78 O2 Sat:      92 % on Room air Temp:     97.1 degrees F oral Pulse rate:   69 / minute BP sitting:   132 / 84  (left arm) Cuff size:   regular  Vitals Entered By: Boone Master CNA/MA (April 04, 2010 9:59 AM)  O2 Flow:  Room air CC: est med calendar - pt did not bring any meds with her today Is Patient Diabetic? No Comments Medications reviewed with patient Daytime contact number verified with patient. Boone Master CNA/MA  April 04, 2010 9:59 AM    Physical Exam  Additional Exam:   January 20, 2010 wt 150 Dec 03, 2009>>156 12/17/09 >157 January 20, 2010 >160 02/17/10 > 165 March 18, 2010 >>162 April 04, 2010    HEENT mild turbinate edema.  Oropharynx no thrush or excess pnd or cobblestoning.  No JVD or cervical adenopathy. Mild accessory muscle hypertrophy. Trachea midline, nl thryroid. Chest was hyperinflated by percussion with diminished breath sounds and moderate increased exp time without wheeze.  Regular rate and rhythm without murmur gallop or rub or increase P2 or edema.  Abd: no hsm, nl excursion. Ext warm without cyanosis or clubbing.     Impression & Recommendations:  Problem # 1:  CHRONIC OBSTRUCTIVE PULMONARY DISEASE (ICD-496) Seems improved on present regimen. We reviewed her med list and MAR and updated her calendar.  will check into pulmonary rehab referral.  flu shot today.  follow up 4 weeks.  She continue to be unable  to work, work note until seen back in office.   Complete Medication List: 1)  Citalopram Hydrobromide 40 Mg Tabs (Citalopram hydrobromide) .... Take 1 tablet by mouth once a day 2)  Trazodone Hcl 150 Mg Tabs (Trazodone hcl) .Marland Kitchen.. 1 at bedtime 3)  Calcium-vitamin D 600-200 Mg-unit Tabs (Calcium-vitamin d) .Marland Kitchen.. 1 two times a day 4)  Vesicare 5 Mg Tabs (Solifenacin succinate) .Marland Kitchen.. 1 once daily 5)  Benicar 20 Mg Tabs (Olmesartan medoxomil) .Marland Kitchen.. 1 by mouth once daily 6)  Omeprazole 20 Mg Cpdr (Omeprazole) .... Take one 30-60 min before first meal of  the day 7)  Oxygen 2 Lpm  .... At bedtime 8)  Spiriva Handihaler 18 Mcg Caps (Tiotropium bromide monohydrate) .... Two puffs in handihaler daily 9)  Tricor 145 Mg Tabs (Fenofibrate) .... Take 1 tablet by mouth once a day 10)  Mucinex Dm 30-600 Mg Xr12h-tab (Dextromethorphan-guaifenesin) .... Take 1-2 tablets every 12 hours as needed 11)  Symbicort 160-4.5 Mcg/act Aero (Budesonide-formoterol fumarate) .... 2 puffs first thing  in am and 2 puffs again in pm about 12 hours later 12)  Xopenex 1.25 Mg/56ml Nebu (Levalbuterol hcl) .... One every 6 hours if needed 13)  Klonopin 0.5 Mg Tabs (Clonazepam) .... One every 6 hours  Other Orders: Est. Patient Level III (16109)  Patient Instructions: 1)  follow up in 4 weeks Dr. Sherene Sires  2)  Follow med calendar closely and birng to each visit.  3)  We will contact pulmonary rehab for you , if you do not hear from them in 2 weeks give [place] a call back.  4)  Please contact office for sooner follow up if symptoms do not improve or worsen  5)  Flu shot today  Prescriptions: XOPENEX 1.25 MG/3ML NEBU (LEVALBUTEROL HCL) one every 6 hours if needed  #90 x 3   Entered and Authorized by:   Rubye Oaks NP   Signed by:   Tammy Parrett NP on 04/04/2010   Method used:   Faxed to ...       [facility]* (retail)             ,          Ph: [phone]       Fax: [phone]   RxID:   [id number] SYMBICORT 160-4.5  MCG/ACT  AERO (BUDESONIDE-FORMOTEROL FUMARATE) 2 puffs first thing  in am and 2 puffs again in pm about 12 hours later  #3 x 3   Entered and Authorized by:   Rubye Oaks NP   Signed by:   Tammy Parrett NP on 04/04/2010   Method used:   Faxed to ...       [facility]* (retail)             ,          Ph: [phone]       Fax: [phone]   RxID:   [id number]   Appended Document: Flu Vax documentation Flu Vaccine Consent Questions     Do you have a history of severe allergic reactions to this vaccine? no    Any prior history of allergic reactions to egg and/or gelatin? no    Do you have a sensitivity to the preservative Thimersol? no    Do you have a past history of Guillan-Barre Syndrome? no    Do you currently have an acute febrile illness? no    Have you ever had a severe reaction to latex? no    Vaccine information given and explained to patient? yes    Are you currently pregnant? no    Lot Number:[id number]   Exp Date:01/21/2011   Site Given  Left Deltoid IM  Boone Master CNA/MA  April 04, 2010 10:53 AM    Clinical Lists Changes  Orders: Added new Service order of Admin 1st Vaccine (32951) - Signed Added new Service order of Flu Vaccine 36yrs + [phone]) - Signed Observations: Added new observation of FLU VAX VIS: 02/15/10 version (04/04/2010 10:52) Added new observation of FLU VAXLOT: [id number] (04/04/2010 10:52) Added new observation of FLU VAXMFR: [employer] (04/04/2010 10:52) Added  new observation of FLU VAX EXP: 01/21/2011 (04/04/2010 10:52) Added new observation of FLU VAX DSE: 0.71ml (04/04/2010 10:52) Added new observation of FLU VAX: Fluvax 3+ (04/04/2010 10:52)      Appended Document: med list update    Clinical Lists Changes  Medications: Added new medication of VITAMIN B-12 1000 MCG TABS (CYANOCOBALAMIN) Take 1 tablet by mouth once a day Changed medication from XOPENEX 1.25 MG/3ML NEBU (LEVALBUTEROL HCL) one every 6 hours if needed to  XOPENEX 1.25 MG/3ML NEBU (LEVALBUTEROL HCL) 1 vial in neb machine every 4 hours as needed wheezing/shortness of breath Changed medication from KLONOPIN 0.5 MG TABS (CLONAZEPAM) one every 6 hours to KLONOPIN 0.5 MG TABS (CLONAZEPAM) 1/2 - 1 every 6 hours as needed for nerves

## 2010-08-23 NOTE — Progress Notes (Signed)
Summary: pulmonary rehab?   Phone Note Outgoing Call Call back at [phone]   Call placed by: Boone Master CNA/MA,  April 07, 2010 11:53 AM Call placed to: [facility] rehab Summary of Call: ---- Converted from flag ---- ---- 04/07/2010 11:53 AM, Boone Master CNA/MA wrote: called spoke with diane @ [facility].  she states that she was away from her desk but does "remember the name."  she is to call me back at the main office visit.  will await her call and convert this flag to a phone message.  ---- 04/05/2010 9:01 AM, Boone Master CNA/MA wrote: patient lives in [place].  called [facility] for [facility] rehab.  EMR chart is unclear which rehab notes etc were sent to.  ---- 04/04/2010 10:26 AM, Boone Master CNA/MA wrote:  TP was looking in pt's chart and sees where an order was sent for [facility].  Pt states they have called her once, but hasn't heard from them since then. ------------------------------  Follow-up for Phone Call        diane returned call.  she states that patient is not in her system. EMR does not specify which campus the order was sent to.  will forward to [facility]. Boone Master CNA/MA  April 07, 2010 3:53 PM     Additional Follow-up for Phone Call Additional follow up Details #1::        pt is scheduled to go for an [email or web address] rehab per husband Additional Follow-up by: Oneita Jolly,  April 07, 2010 4:25 PM

## 2010-08-23 NOTE — Letter (Signed)
Summary: Out of Work  [employer]  [address]   [place], [place] [address]   Phone: [phone]  Fax: [phone]    January 20, 2010   Employee:  Alette Falzone    To Whom It May Concern:   For Medical reasons, please excuse the above named employee from work for the following dates:  Start:   11/01/09  End:   02/17/10  If you need additional information, please feel free to contact our office.         Sincerely,    Sandrea Hughs, MD

## 2010-08-23 NOTE — Assessment & Plan Note (Signed)
Summary: Pulmonary/ new pt eval / copd plus ace > try off   Visit Type:  Initial Consult Copy to:  Dr. Warrick Parisian Primary Provider/Referring Provider:  Dr. Warrick Parisian  CC:  Dyspnea.  History of Present Illness: 60 yowf quit smoking 1994 sob at time quit but much worse since.   Dec 03, 2009 cc doe x much worse x 2 years on 02 since 10/2009 assoc with cough and wheeze.  Better laying down. sometimes better with nebulizer, sometimes sob at rest assoc with lots of daytime coughing and heart burn despite PPI rx.  no purulent sputum. presently sob x room to room does not use 02 daytime.   Pt denies any significant sore throat, dysphagia, itching, sneezing,  nasal congestion or excess secretions,  fever, chills, sweats, unintended wt loss, pleuritic or exertional cp, hempoptysis, change in activity tolerance  orthopnea pnd or leg swelling Pt also denies any obvious fluctuation in symptoms with weather or environmental change or other alleviating or aggravating factors.  no better on spiriva     Current Medications (verified): 1)  Lisinopril 20 Mg Tabs (Lisinopril) .Marland Kitchen.. 1 Once Daily 2)  Prednisone 20 Mg Tabs (Prednisone) .... 1/2 Two Times A Day 3)  Clonazepam 0.5 Mg Tabs (Clonazepam) .... 1/2 To 1 Two Times A Day As Needed 4)  Trazodone Hcl 150 Mg Tabs (Trazodone Hcl) .Marland Kitchen.. 1 At Bedtime 5)  Tricor 145 Mg Tabs (Fenofibrate) .Marland Kitchen.. 1 Once Daily 6)  Citalopram Hydrobromide 40 Mg Tabs (Citalopram Hydrobromide) .Marland Kitchen.. 1 Once Daily 7)  Omeprazole 20 Mg Cpdr (Omeprazole) .Marland Kitchen.. 1 Once Daily 8)  Vesicare 5 Mg Tabs (Solifenacin Succinate) .Marland Kitchen.. 1 Once Daily 9)  Spiriva Handihaler 18 Mcg Caps (Tiotropium Bromide Monohydrate) .... Inhale Contents of 1 Capsule Daily 10)  Calcium-Vitamin D 600-200 Mg-Unit Tabs (Calcium-Vitamin D) .Marland Kitchen.. 1 Two Times A Day 11)  Vitamin B-12 1000 Mcg Tabs (Cyanocobalamin) .Marland Kitchen.. 1 Once Daily 12)  Vitamin D3 2000 Unit Caps (Cholecalciferol) .Marland Kitchen.. 1 Once Daily 13)  Oxygen 2 Lpm  .... At Bedtime  Allergies (verified): 1)  ! Pcn  Past History:  Past Medical History: LUMBAGO (ICD-724.2) CHRONIC OBSTRUCTIVE PULMONARY DISEASE (ICD-496)     - PFT's ? date FEV1 .55 (20%) ratio 54 and 29% improvement after B2 HYPERTENSION     - ACE d/c  12/03/09   Family History: Emphysema- Brothers Asthma- Brothers Heart dz- Father and Brothers  Social History: Patient states former smoker. Quit in 1994.  Smoked approx 30 yrs up to 3 ppd. ETOH- daily Married Location manager  Review of Systems       The patient complains of shortness of breath with activity, shortness of breath at rest, productive cough, chest pain, acid heartburn, and anxiety.  The patient denies non-productive cough, coughing up blood, irregular heartbeats, indigestion, loss of appetite, weight change, abdominal pain, difficulty swallowing, sore throat, tooth/dental problems, headaches, nasal congestion/difficulty breathing through nose, sneezing, itching, ear ache, depression, hand/feet swelling, joint stiffness or pain, rash, change in color of mucus, and fever.    Vital Signs:  Patient profile:   60 year old female Height:      62 inches Weight:      150 pounds BMI:     27.53 O2 Sat:      90 % on Room air Temp:     97.3 degrees F oral Pulse rate:   88 / minute BP sitting:   118 / 82  (left arm)  Vitals Entered By: Vernie Murders (Dec 03, 2009 2:15 PM)  O2 Flow:  Room air  Physical Exam  Additional Exam:  wt 150 Dec 03, 2009 amb depressed wf with classic voice fatigue and pseudowheeze improves with purse lip maneuver  HEENT mild turbinate edema.  Oropharynx no thrush or excess pnd or cobblestoning.  No JVD or cervical adenopathy. Mild accessory muscle hypertrophy. Trachea midline, nl thryroid. Chest was hyperinflated by percussion with diminished breath sounds and moderate increased exp time without wheeze. Hoover sign positive at mid inspiration. Regular rate and rhythm without murmur gallop or  rub or increase P2 or edema.  Abd: no hsm, nl excursion. Ext warm without cyanosis or clubbing.     CXR  Procedure date:  12/03/2009  Findings:        Comparison: 11/28/2008   Findings: Multiple healed right upper rib fractures noted.  Normal heart size and vascularity.  No focal pneumonia, edema, effusion or pneumothorax.  Midline trachea.   IMPRESSION: Healed right upper rib fractures. No acute chest finding  Impression & Recommendations:  Problem # 1:  CHRONIC OBSTRUCTIVE PULMONARY DISEASE (ICD-496) Moderate clinically but will need repeat  pft's once we have addressed poor control of her symptoms which are markedly atypical in that they don't bother her at all at night and no better on spiriva   DDX of  difficult airways managment all start with A and  include Adherence, Ace Inhibitors, Acid Reflux, Active Sinus Disease, Alpha 1 Antitripsin deficiency, Anxiety masquerading as Airways dz,  ABPA,  allergy(esp in young), Aspiration (esp in elderly), Adverse effects of DPI,  Active smokers, plus one B  = Beta blocker use..   Adherence:  does not know names of meds or inhalers or neb solutions. will need to return in 2 weeks for full med reconciliation  Ace Inhibitors see #2  Acid reflux:  double PPI plus diet  Adverse effects of DPI - try off spiriva since she doesn't think it helps and just use the neb for now  Anxiety? underlying depression already on rx    Problem # 2:  HYPERTENSION, BENIGN (ICD-401.1)  The following medications were removed from the medication list:    Lisinopril 20 Mg Tabs (Lisinopril) .Marland Kitchen... 1 once daily Her updated medication list for this problem includes:    Benicar 20 Mg Tabs (Olmesartan medoxomil) ..... One tablet by mouth daily  ACE inhibitors are problematic in  pts with airway complaints because  even experienced pulmonologists can't always distinguish ace effects from copd/asthma.  By themselves they don't actually cause a problem, much like  oxygen can't by itself start a fire, but they certainly serve as a powerful catalyst or enhancer for any "fire"  or inflammatory process in the upper airway, be it caused by an ET  tube or more commonly reflux (especially in the obese or pts with known GERD as is her case  or who are on biphoshonates).  In the era of ARB near equivalency until we have a better handle on the reversibility of the airway problem, it just makes sense to avoid ace entirely in the short run and then decide later, having established a level of airway control using a reasonable limited regimen, whether to add back ace but even then being very careful to observe the pt for worsening airway control and number of meds used/ needed to control symptoms.  I would probably avoid this class entirely given the complexity of her care.  Orders: Consultation Level V (04540)  Medications Added to Medication List  This Visit: 1)  Lisinopril 20 Mg Tabs (Lisinopril) .Marland Kitchen.. 1 once daily 2)  Prednisone 20 Mg Tabs (Prednisone) .... 1/2 two times a day 3)  Clonazepam 0.5 Mg Tabs (Clonazepam) .... 1/2 to 1 two times a day as needed 4)  Trazodone Hcl 150 Mg Tabs (Trazodone hcl) .Marland Kitchen.. 1 at bedtime 5)  Tricor 145 Mg Tabs (Fenofibrate) .Marland Kitchen.. 1 once daily 6)  Citalopram Hydrobromide 40 Mg Tabs (Citalopram hydrobromide) .Marland Kitchen.. 1 once daily 7)  Omeprazole 20 Mg Cpdr (Omeprazole) .Marland Kitchen.. 1 once daily 8)  Omeprazole 20 Mg Cpdr (Omeprazole) .... Take one 30-60 min before first and last meals of the day 9)  Vesicare 5 Mg Tabs (Solifenacin succinate) .Marland Kitchen.. 1 once daily 10)  Spiriva Handihaler 18 Mcg Caps (Tiotropium bromide monohydrate) .... Inhale contents of 1 capsule daily 11)  Calcium-vitamin D 600-200 Mg-unit Tabs (Calcium-vitamin d) .Marland Kitchen.. 1 two times a day 12)  Vitamin B-12 1000 Mcg Tabs (Cyanocobalamin) .Marland Kitchen.. 1 once daily 13)  Vitamin D3 2000 Unit Caps (Cholecalciferol) .Marland Kitchen.. 1 once daily 14)  Oxygen 2 Lpm  .... At bedtime 15)  Benicar 20 Mg Tabs (Olmesartan  medoxomil) .... One tablet by mouth daily  Other Orders: T-2 View CXR (71020TC)  Patient Instructions: 1)  stop lisinopril and spiriva  2)  Benicar 20 mg one daily  3)  Increase omeprazole Take one 30-60 min before first and last meals of the day  4)  GERD (REFLUX)  is a common cause of respiratory symptoms. It commonly presents without heartburn and can be treated with medication, but also with lifestyle changes including avoidance of late meals, excessive alcohol, smoking cessation, and avoid fatty foods, chocolate, peppermint, colas, red wine, and acidic juices such as orange juice. NO MINT OR MENTHOL PRODUCTS SO NO COUGH DROPS  5)  USE SUGARLESS CANDY INSTEAD (jolley ranchers)  6)  NO OIL BASED VITAMINS  7)  Use nebulizer up to 4 hours if needed 8)  See Tammy NP w/in 2 weeks with all your medications, even over the counter meds, separated in two separate bags, the ones you take no matter what vs the ones you stop once you feel better and take only as needed.  She will generate for you a new user friendly medication calendar that will put [place] all on the same page re: your medication use.    CXR  Procedure date:  12/03/2009  Findings:        Comparison: 11/28/2008   Findings: Multiple healed right upper rib fractures noted.  Normal heart size and vascularity.  No focal pneumonia, edema, effusion or pneumothorax.  Midline trachea.   IMPRESSION: Healed right upper rib fractures. No acute chest finding

## 2010-08-23 NOTE — Progress Notes (Signed)
  Phone Note Other Incoming   Request: Send information Summary of Call: Request for records received from [employer] along with a short form. Request forwarded to [employer].

## 2010-08-23 NOTE — Assessment & Plan Note (Signed)
Summary: Pulmonary/ ext f/u ov with hfa 50% p coaching   Copy to:  Dr. Warrick Parisian Primary Provider/Referring Provider:  Dr. Warrick Parisian  CC:  4 wk followup.  Pt states that her breathing is the same since last seen- no better or worse.   She states that her cough is worse over the past 1 wk- prod with white/clear clear sputum.  States that she wakes up sometimes in the night with coughing spells- sometimes to the point where she gags. .  History of Present Illness: 72 yowf quit smoking 1994 sob at time quit but much worse since.   Dec 03, 2009 cc doe x much worse x 2 years on 02 since 10/2009 assoc with cough and wheeze.  Better laying down. sometimes better with nebulizer, sometimes sob at rest assoc with lots of daytime coughing and heart burn despite PPI rx.  no purulent sputum. presently sob x room to room does not use 02 daytime.   Dec 17, 2009--Presents for follow up and med reivew. She is doing better. But cough is not gone yet.  Last visit CXR w/ no acute changes . Lisinopril and Spiriva stopped. Started on Benicar.  Started on prednisone 20mg  3 weeks ago for breathing problems. Has been out of work On average drink 12-18 beers , stopped last week now drinking non alcohol beer for last week 10-18 daily. We reviewed meds and organized into a med calendar.   January 20, 2010 cc t her breathing is the same- no better or worse.  No new complaints today. has trouble walking  back from out building to house due to sob.    FEV1 .92 >  add spiriva  February 17, 2010 --Presents for work in visit. Complains of  increased SOB, prod cough with clear-to-green mucus, wheezing,  Not feeling well for last couple of days.  rx doxy  March 17, 2010 4 wk followup.  Pt states that her breathing is the same since last seen- no better or worse.   She states that her cough is worse over the past 1 wk- prod with white/clear clear sputum.  States that she wakes up sometimes in the night with coughing spells-  sometimes to the point where she gags. Pt denies any significant sore throat, dysphagia, itching, sneezing,  nasal congestion or excess secretions,  fever, chills, sweats, unintended wt loss, pleuritic or exertional cp, hempoptysis, change in activity tolerance  orthopnea pnd or leg swelling. Pt also denies any obvious fluctuation in symptoms with weather or environmental change or other alleviating or aggravating factors.    Confused with instructions on maint vs prns     Current Medications (verified): 1)  Citalopram Hydrobromide 40 Mg Tabs (Citalopram Hydrobromide) .... Take 1 Tablet By Mouth Once A Day 2)  Trazodone Hcl 150 Mg Tabs (Trazodone Hcl) .Marland Kitchen.. 1 At Bedtime 3)  Calcium-Vitamin D 600-200 Mg-Unit Tabs (Calcium-Vitamin D) .Marland Kitchen.. 1 Two Times A Day 4)  Vesicare 5 Mg Tabs (Solifenacin Succinate) .Marland Kitchen.. 1 Once Daily 5)  Benicar 20 Mg Tabs (Olmesartan Medoxomil) .Marland Kitchen.. 1 By Mouth Once Daily 6)  Omeprazole 20 Mg Cpdr (Omeprazole) .... Take One 30-60 Min Before First Meal of The Day 7)  Oxygen 2 Lpm .... At Bedtime 8)  Spiriva Handihaler 18 Mcg  Caps (Tiotropium Bromide Monohydrate) .... Two Puffs in Handihaler Daily 9)  Tricor 145 Mg Tabs (Fenofibrate) .... Take 1 Tablet By Mouth Once A Day 10)  Ipratropium-Albuterol 0.5-2.5 (3) Mg/8ml Soln (Ipratropium-Albuterol) .... Inhale 1  Vial Via Hhn Every 6 Hours As Needed 11)  Mucinex Dm 30-600 Mg Xr12h-Tab (Dextromethorphan-Guaifenesin) .... Take 1-2 Tablets Every 12 Hours As Needed  Allergies (verified): 1)  ! Pcn  Past History:  Past Medical History: LUMBAGO (ICD-724.2) CHRONIC OBSTRUCTIVE PULMONARY DISEASE (ICD-496)     - PFT's ? date FEV1 .55 (20%) ratio 54 and 29% improvement after B2     - PFT's January 20, 2010 FEV1 0.92 (45%) ratio 53 DLC0 41 > 82%     -02/17/10  refer to pulm rehab     - HFA 50% March 18, 2010  HYPERTENSION     - ACE d/c  12/03/09 > no better January 20, 2010   Vital Signs:  Patient profile:   60 year old female Weight:       165 pounds O2 Sat:      91 % on Room air Temp:     98.4 degrees F oral Pulse rate:   91 / minute BP sitting:   148 / 88  (left arm)  Vitals Entered By: Vernie Murders (March 17, 2010 9:58 AM)  O2 Flow:  Room air  Physical Exam  Additional Exam:   January 20, 2010 wt 150 Dec 03, 2009>>156 12/17/09 >157 January 20, 2010 >160 02/17/10 > 165 March 18, 2010     HEENT mild turbinate edema.  Oropharynx no thrush or excess pnd or cobblestoning.  No JVD or cervical adenopathy. Mild accessory muscle hypertrophy. Trachea midline, nl thryroid. Chest was hyperinflated by percussion with diminished breath sounds and moderate increased exp time without wheeze. Hoover sign positive at mid inspiration. Regular rate and rhythm without murmur gallop or rub or increase P2 or edema.  Abd: no hsm, nl excursion. Ext warm without cyanosis or clubbing.     Impression & Recommendations:  Problem # 1:  CHRONIC OBSTRUCTIVE PULMONARY DISEASE (ICD-496)   DDX of  difficult airways managment all start with A and  include Adherence, Ace Inhibitors, Acid Reflux, Active Sinus Disease, Alpha 1 Antitripsin deficiency, Anxiety masquerading as Airways dz,  ABPA,  allergy(esp in young), Aspiration (esp in elderly), Adverse effects of DPI,  Active smokers, plus one B  = Beta blocker use..   Adherence   - Poor understanding / insight into meds   - I spent extra time with the patient today explaining optimal mdi  technique.  This improved from  25-50% but no better  ? reflux diet reviewed  ? Anxiety with wt gain > clonazepam, pulmonary rehab  Problem # 2:  HYPERTENSION, BENIGN (ICD-401.1)  Her updated medication list for this problem includes:    Benicar 20 Mg Tabs (Olmesartan medoxomil) .Marland Kitchen... 1 by mouth once daily  Ok off ace.   ACE inhibitors are problematic in  pts with airway complaints because  even experienced pulmonologists can't always distinguish ace effects from copd/asthma.  By themselves they don't actually cause  a problem, much like oxygen can't by itself start a fire, but they certainly serve as a powerful catalyst or enhancer for any "fire"  or inflammatory process in the upper airway, be it caused by an ET  tube or more commonly reflux (especially in the obese or pts with known GERD or who are on biphoshonates).  In the era of ARB near equivalency until we have a better handle on the reversibility of the airway problem, it just makes sense to avoid ace entirely in the short run and then decide later, having established a level of airway control using  a reasonable limited regimen, whether to add back ace but even then being very careful to observe the pt for worsening airway control and number of meds used/ needed to control symptoms.    Orders: Est. Patient Level IV (41324)  Medications Added to Medication List This Visit: 1)  Symbicort 160-4.5 Mcg/act Aero (Budesonide-formoterol fumarate) .... 2 puffs first thing  in am and 2 puffs again in pm about 12 hours later 2)  Xopenex 1.25 Mg/17ml Nebu (Levalbuterol hcl) .... One every 6 hours if needed 3)  Klonopin 0.5 Mg Tabs (Clonazepam) .... One every 6 hours  Patient Instructions: 1)  stop duoneb 2)  if needed for breathing use xopenex every in 6hours 3)  start symbicort 160 2 puffs first thing  in am and 2 puffs again in pm about 12 hours later  4)  Work on inhaler technique:  relax and blow all the way out then take a nice smooth deep breath back in, triggering the inhaler at same time you start breathing in  5)  See Tammy NP w/in 2 weeks with all your medications, even over the counter meds, separated in two separate bags, the ones you take no matter what vs the ones you stop once you feel better and take only as needed.  She will generate for you a new user friendly medication calendar that will put [place] all on the same page re: your medication use. Prescriptions: KLONOPIN 0.5 MG TABS (CLONAZEPAM) one every 6 hours  #90 x 0   Entered and Authorized by:    Nyoka Cowden MD   Signed by:   Nyoka Cowden MD on 03/17/2010   Method used:   Print then Give to Patient   RxID:   [id number]

## 2010-08-23 NOTE — Assessment & Plan Note (Signed)
Summary: NP follow up - med calendar   Copy to:  Dr. Warrick Parisian Primary Provider/Referring Provider:  Dr. Warrick Parisian  CC:  est med calendar - pt brought all meds with her today.  no new complaints..  History of Present Illness: 60 yowf quit smoking 1994 sob at time quit but much worse since.   Dec 03, 2009 cc doe x much worse x 2 years on 02 since 10/2009 assoc with cough and wheeze.  Better laying down. sometimes better with nebulizer, sometimes sob at rest assoc with lots of daytime coughing and heart burn despite PPI rx.  no purulent sputum. presently sob x room to room does not use 02 daytime.   Dec 17, 2009--Presents for follow up and med reivew. She is doing better. But cough is not gone yet.  Last visit CXR w/ no acute changes . Lisinopril and Spiriva stopped. Started on Benicar.  Started on prednisone 20mg  3 weeks ago for breathing problems. Has been out of work On average drink 12-18 beers , stopped last week now drinking non alcohol beer for last week 10-18 daily. We reviewed meds and organized into a med calendar.   January 20, 2010 cc t her breathing is the same- no better or worse.  No new complaints today. has trouble walking  back from out building to house due to sob.    FEV1 .92 >  added  spiriva    March 17, 2010 4 wk followup.  Pt states that her breathing is the same since last seen- no better or worse.   She states that her cough is worse over the past 1 wk- prod with white/clear clear sputum.  States that she wakes up sometimes in the night with coughing spells- sometimes to the point where she gags. Confused with instructions on maint vs prns>>Rx Symbicort, and stopped duoneb>changed to xopenex as needed    April 04, 2010--Presents for follow up and med review. Unfortunately only brought Med calendar. We reviewd this and updated a new list. She is feelimg better w/ decreaesd cough and wheeizng. Last visit symbicort added. She does continue to wear easily  with minimal activity. Waiting for pulmonary rehab >  started it 04/2010 but desats with ex  May 11, 2010 cc doe same, says sats are dropping with ex  and requesting amb 02 . overall ex tol improving. Pt denies any significant sore throat, dysphagia, itching, sneezing,  nasal congestion or excess secretions,  fever, chills, sweats, unintended wt loss, pleuritic or exertional cp, hempoptysis, change in activity tolerance  orthopnea pnd or leg swelling. Pt also denies any obvious fluctuation in symptoms with weather or environmental change or other alleviating or aggravating factors.    June 22, 2010 --Presents for follow up and med review. We reviewed her meds along with her husband. She does not appear to know her meds very well. Not sure if she is taking benicar or not. Still using BC powder despite recommendations to stop in past. Husband says recently changed from prilosec to zantac however also taking pepcid. We organized her meds and updated her med calendar. She signed this and verbalized understanding to bring to each visit. She feels her breathing is doing well, enjoying pulm. rehab. Going to pulmonary rehab two times a day. Denies chest pain, orthopnea, hemoptysis, fever, n/v/d, edema, headache.    Medications Prior to Update: 1)  Citalopram Hydrobromide 40 Mg Tabs (Citalopram Hydrobromide) .... Take 1 Tablet By Mouth Once A Day 2)  Trazodone  Hcl 150 Mg Tabs (Trazodone Hcl) .Marland Kitchen.. 1 At Bedtime 3)  Vitamin B-12 1000 Mcg Tabs (Cyanocobalamin) .... Take 1 Tablet By Mouth Once A Day 4)  Calcium-Vitamin D 600-200 Mg-Unit Tabs (Calcium-Vitamin D) .Marland Kitchen.. 1 Two Times A Day 5)  Vesicare 5 Mg Tabs (Solifenacin Succinate) .Marland Kitchen.. 1 Once Daily 6)  Benicar 20 Mg Tabs (Olmesartan Medoxomil) .Marland Kitchen.. 1 By Mouth Once Daily 7)  Omeprazole 20 Mg Cpdr (Omeprazole) .... Take One 30-60 Min Before First Meal of The Day 8)  Spiriva Handihaler 18 Mcg  Caps (Tiotropium Bromide Monohydrate) .... Two Puffs in Handihaler  Daily 9)  Oxygen 2 Lpm .... At Bedtime and With Any More Than Room To Room Walking 10)  Symbicort 160-4.5 Mcg/act  Aero (Budesonide-Formoterol Fumarate) .... 2 Puffs First Thing  in Am and 2 Puffs Again in Pm About 12 Hours Later 11)  Tricor 145 Mg Tabs (Fenofibrate) .... Take 1 Tablet By Mouth Once A Day 12)  Xopenex 1.25 Mg/11ml Nebu (Levalbuterol Hcl) .Marland Kitchen.. 1 Vial in [employer] Every 4 Hours As Needed Wheezing/shortness of Breath 13)  Mucinex Dm 30-600 Mg Xr12h-Tab (Dextromethorphan-Guaifenesin) .... Take 1-2 Tablets Every 12 Hours As Needed 14)  Klonopin 0.5 Mg Tabs (Clonazepam) .... 1/2 - 1 Every 6 Hours As Needed For Nerves 15)  Xopenex Hfa 45 Mcg/act Aero (Levalbuterol Tartrate) .... 2 Puffs Every 4 Hours If Needed  Allergies (verified): 1)  ! Pcn  Past History:  Family History: Last updated: 12/03/2009 Emphysema- Brothers Asthma- Brothers Heart dz- Father and Brothers  Social History: Last updated: 12/03/2009 Patient states former smoker. Quit in 1994.  Smoked approx 30 yrs up to 3 ppd. ETOH- daily Married Location manager  Risk Factors: Smoking Status: quit (12/02/2009)  Past Medical History: LUMBAGO (ICD-724.2) CHRONIC OBSTRUCTIVE PULMONARY DISEASE (ICD-496)     - PFT's ? date FEV1 .55 (20%) ratio 54 and 29% improvement after B2     - PFT's January 20, 2010 FEV1 0.92 (45%) ratio 53 DLC0 41 > 82%     -02/17/10  refer to pulm rehab     - HFA 50% March 18, 2010 > 75% May 11, 2010  HYPERTENSION     - ACE d/c  12/03/09 > no better January 20, 2010  COMPLEX MED REGIMEN -----Med  review/ pt education and computerized med calendar April 04, 2010 but did not bring meds, June 22, 2010 , signed copy in [place],     Review of Systems      See HPI  Vital Signs:  Patient profile:   60 year old female Height:      62 inches Weight:      160 pounds BMI:     29.37 O2 Sat:      91 % on Room air Temp:     97.2 degrees F oral Pulse rate:   79 / minute BP sitting:   138 /  90  (left arm) Cuff size:   regular  Vitals Entered By: Boone Master CNA/MA (June 22, 2010 8:58 AM)  O2 Flow:  Room air CC: est med calendar - pt brought all meds with her today.  no new complaints. Is Patient Diabetic? No Comments Medications reviewed with patient Daytime contact number verified with patient. Boone Master CNA/MA  June 22, 2010 9:00 AM    Physical Exam  Additional Exam:  wt 150 Dec 03, 2009>160 02/17/10 > 165 March 18, 2010 >>162 April 04, 2010> 157 May 11, 2010 >>160 June 22, 2010 depressed appearing amb  wf nad  HEENT mild turbinate edema.  Oropharynx no thrush or excess pnd or cobblestoning.  No JVD or cervical adenopathy. Mild accessory muscle hypertrophy. Trachea midline, nl thryroid. Chest was hyperinflated by percussion with diminished breath sounds and moderate increased exp time without wheeze.  Regular rate and rhythm without murmur gallop or rub or increase P2 or edema.  Abd: no hsm, nl excursion. Ext warm without cyanosis or clubbing.      Impression & Recommendations:  Problem # 1:  CHRONIC OBSTRUCTIVE PULMONARY DISEASE (ICD-496) Compensated on present regimen. cont on same meds.  cont pulm rehab.  Meds reviewed with pt education and computerized med calendar adjusted.   Other Orders: Est. Patient Level III (57846)  Patient Instructions: 1)  Please stop using BC powder.  2)  Stop Pepcid (Acid reducer complete).  3)  Look for Benicar when you get home, call back if you can not find it.  4)  Follow med calendar closely and bring to each visit.  5)  follow up  Dr. Sherene Sires in 3 months     Appended Document: med calendar update meds updated in EMR to match med calendar.  signed copy of med calendar by patient and TP sent to be scanned in EMR. Boone Master CNA/MA  June 22, 2010 9:58 AM    Clinical Lists Changes  Medications: Added new medication of RANITIDINE HCL 150 MG CAPS (RANITIDINE HCL) 1 capsule by mouth  every  morning and at bedtime Changed medication from * OXYGEN 2 LPM at bedtime and with any more than room to room walking to * OXYGEN 2 LPM wear at bedtime Removed medication of VITAMIN B-12 1000 MCG TABS (CYANOCOBALAMIN) Take 1 tablet by mouth once a day Removed medication of OMEPRAZOLE 20 MG CPDR (OMEPRAZOLE) Take one 30-60 min before first meal of the day Removed medication of KLONOPIN 0.5 MG TABS (CLONAZEPAM) 1/2 - 1 every 6 hours as needed for nerves Added new medication of ADVIL 200 MG TABS (IBUPROFEN) per bottle Added new medication of * OXYGEN 2L/MIN wear as needed for shortness of breath

## 2010-08-23 NOTE — Letter (Signed)
Summary: [employer]  [address]   [place], [place] [address]   Phone: [phone]  Fax: [phone]    Today's Date: April 04, 2010  Name of Patient: Beth Meyers  The above named patient had a medical visit today at:  9:45 am.  Please take this into consideration when reviewing the time away from work.    Special Instructions:  [  ] None  [  ] To be off the remainder of today, returning to the normal work / school schedule tomorrow.  [ X ] To be off until the next scheduled appointment on ______________________.  [  ] Other ________________________________________________________________ ________________________________________________________________________   Sincerely yours,       Tammy Parrett, N.P.

## 2010-08-23 NOTE — Letter (Signed)
Summary: Out of Work  [employer]  [address]   [place], [place] [address]   Phone: [phone]  Fax: [phone]    May 11, 2010   Employee:  Memori Fredericks    To Whom It May Concern:   For Medical reasons, please excuse the above named employee from work for the following dates:  Start:   October 19th, 2011  End:   April 11th, 2012  If you need additional information, please feel free to contact our office.         Sincerely,      Sandrea Hughs, MD

## 2010-08-26 ENCOUNTER — Ambulatory Visit: Admit: 2010-08-26 | Payer: Self-pay | Admitting: Internal Medicine

## 2010-08-26 ENCOUNTER — Encounter: Payer: Self-pay | Admitting: Internal Medicine

## 2010-08-26 ENCOUNTER — Ambulatory Visit (INDEPENDENT_AMBULATORY_CARE_PROVIDER_SITE_OTHER): Payer: BC Managed Care – PPO | Admitting: Internal Medicine

## 2010-08-26 DIAGNOSIS — J449 Chronic obstructive pulmonary disease, unspecified: Secondary | ICD-10-CM
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
## 2010-09-06 ENCOUNTER — Telehealth (INDEPENDENT_AMBULATORY_CARE_PROVIDER_SITE_OTHER): Payer: Self-pay | Admitting: *Deleted

## 2010-09-07 ENCOUNTER — Encounter: Payer: Self-pay | Admitting: Internal Medicine

## 2010-09-20 NOTE — Medication Information (Signed)
Summary: Micardis/[facility]  Micardis/[facility]   Imported By: Sherian Rein 09/12/2010 08:48:58  _____________________________________________________________________  External Attachment:    Type:   Image     Comment:   External Document

## 2010-09-20 NOTE — Progress Notes (Signed)
Summary: Change benicar to micardis 80 due to ins<<<done  Phone Note From Pharmacy   Caller: fax from [facility] Call For: MW  Summary of Call: received fax from [facility] stating that benicar is not covered by pt's insurance.  covered alternatives include losartan tabs and micardis tabs.  fax placed in MW's look at for recs. Initial call taken by: Boone Master CNA/MA,  September 06, 2010 5:06 PM  Follow-up for Phone Call        Coralie Common with [facility] phoned regarding a fax that she sent in on 2/10 regarding the patients Benecor 20 mg. this needs plan review there are three other meds that do not need plan review. She needs to determine if the doctor will agree that a preferred will be appropiate her call back is [phone] reference [phone] if they dont receive a response by tommorw at 5 they will cancel the benecor prescription.Vedia Coffer  September 07, 2010 9:50 AM  Additional Follow-up for Phone Call Additional follow up Details #1::        MW pls advise on alternative.Michel Bickers [facility]  September 07, 2010 10:02 AM completed form for micardis 80 mg one daily Alanda Slim with [facility] phoned stated she still had not received the fax response regarding Ms. Southerns Benicar and covered alternatives.Vedia Coffer  September 08, 2010 2:48 PM Additional Follow-up by: Nyoka Cowden MD,  September 07, 2010 10:16 AM    Additional Follow-up for Phone Call Additional follow up Details #2::    Benicar will change to Micardis 80mg  per Dr. Sherene Sires.  Will send electronic RX to [facility] to ensure that they did receive.it.  Pt is aware of the change.  Michel Bickers CMA  September 13, 2010 8:53 AM  New/Updated Medications: MICARDIS 80 MG  TABS (TELMISARTAN) One tablet daily Prescriptions: MICARDIS 80 MG  TABS (TELMISARTAN) One tablet daily  #90 x 3   Entered by:   Michel Bickers CMA   Authorized by:   Nyoka Cowden MD   Signed by:   Michel Bickers CMA on 09/13/2010   Method used:   Faxed to ...       [facility]  [place] ([place])             , [place]         Ph: [phone]       Fax: [phone]   RxID:   [id number]

## 2010-10-18 ENCOUNTER — Telehealth: Payer: Self-pay | Admitting: Internal Medicine

## 2010-10-18 MED ORDER — TELMISARTAN 80 MG PO TABS: 80.0000 mg | ORAL_TABLET | Freq: Every day | ORAL | Status: DC

## 2010-10-18 NOTE — Telephone Encounter (Signed)
Spoke with pt's daughter, Toniann Fail.  In Feb, benicar was changed to micardis by Dr. Sherene Sires d/t insurance reasons.  Per Toniann Fail, pt "got confused" about her medications. She was taking a "nerve pill" and thought this was the new BP medication she was supposed to be taking.  Toniann Fail caught this today.  Requesting the name of the med benicar was changed to so they can call [facility] and have it shipped.  Also, requesting a mo supply be sent to [facility] in [place] bc it will take a little while to get medication from [facility].  Advised benicar was changed to micardis qd and rx will be sent.  Toniann Fail verbalized understanding.

## 2010-10-18 NOTE — Telephone Encounter (Signed)
[id number]

## 2010-10-18 NOTE — Telephone Encounter (Signed)
Pt's daughter returning call.

## 2010-11-01 LAB — CBC
HCT: 28.1 % — ABNORMAL LOW (ref 36.0–46.0)
HCT: 30.1 % — ABNORMAL LOW (ref 36.0–46.0)
HCT: 30.8 % — ABNORMAL LOW (ref 36.0–46.0)
HCT: 31.7 % — ABNORMAL LOW (ref 36.0–46.0)
HCT: 32.7 % — ABNORMAL LOW (ref 36.0–46.0)
HCT: 32.8 % — ABNORMAL LOW (ref 36.0–46.0)
HCT: 33.1 % — ABNORMAL LOW (ref 36.0–46.0)
HCT: 34.3 % — ABNORMAL LOW (ref 36.0–46.0)
Hemoglobin: 10.2 g/dL — ABNORMAL LOW (ref 12.0–15.0)
Hemoglobin: 10.8 g/dL — ABNORMAL LOW (ref 12.0–15.0)
Hemoglobin: 11.1 g/dL — ABNORMAL LOW (ref 12.0–15.0)
Hemoglobin: 11.2 g/dL — ABNORMAL LOW (ref 12.0–15.0)
Hemoglobin: 11.3 g/dL — ABNORMAL LOW (ref 12.0–15.0)
Hemoglobin: 11.4 g/dL — ABNORMAL LOW (ref 12.0–15.0)
Hemoglobin: 9.2 g/dL — ABNORMAL LOW (ref 12.0–15.0)
Hemoglobin: 9.5 g/dL — ABNORMAL LOW (ref 12.0–15.0)
Hemoglobin: 9.7 g/dL — ABNORMAL LOW (ref 12.0–15.0)
MCHC: 34 g/dL (ref 30.0–36.0)
MCHC: 34.1 g/dL (ref 30.0–36.0)
MCHC: 34.3 g/dL (ref 30.0–36.0)
MCHC: 34.3 g/dL (ref 30.0–36.0)
MCHC: 34.4 g/dL (ref 30.0–36.0)
MCHC: 34.6 g/dL (ref 30.0–36.0)
MCV: 92.3 fL (ref 78.0–100.0)
MCV: 92.5 fL (ref 78.0–100.0)
MCV: 93.1 fL (ref 78.0–100.0)
MCV: 93.1 fL (ref 78.0–100.0)
MCV: 93.5 fL (ref 78.0–100.0)
MCV: 94.2 fL (ref 78.0–100.0)
Platelets: 247 10*3/uL (ref 150–400)
Platelets: 250 10*3/uL (ref 150–400)
Platelets: 268 10*3/uL (ref 150–400)
Platelets: 291 10*3/uL (ref 150–400)
Platelets: 299 10*3/uL (ref 150–400)
Platelets: 303 10*3/uL (ref 150–400)
RBC: 2.86 MIL/uL — ABNORMAL LOW (ref 3.87–5.11)
RBC: 2.97 MIL/uL — ABNORMAL LOW (ref 3.87–5.11)
RBC: 3.37 MIL/uL — ABNORMAL LOW (ref 3.87–5.11)
RBC: 3.51 MIL/uL — ABNORMAL LOW (ref 3.87–5.11)
RDW: 14.4 % (ref 11.5–15.5)
RDW: 14.4 % (ref 11.5–15.5)
RDW: 14.6 % (ref 11.5–15.5)
RDW: 14.8 % (ref 11.5–15.5)
RDW: 14.9 % (ref 11.5–15.5)
RDW: 14.9 % (ref 11.5–15.5)
RDW: 14.9 % (ref 11.5–15.5)
RDW: 15.1 % (ref 11.5–15.5)
RDW: 15.2 % (ref 11.5–15.5)
WBC: 4.8 10*3/uL (ref 4.0–10.5)
WBC: 5.6 10*3/uL (ref 4.0–10.5)
WBC: 5.7 10*3/uL (ref 4.0–10.5)
WBC: 6.2 10*3/uL (ref 4.0–10.5)
WBC: 6.2 10*3/uL (ref 4.0–10.5)

## 2010-11-01 LAB — POCT I-STAT, CHEM 8
Calcium, Ion: 0.98 mmol/L — ABNORMAL LOW (ref 1.12–1.32)
Creatinine, Ser: 0.8 mg/dL (ref 0.4–1.2)
Glucose, Bld: 97 mg/dL (ref 70–99)
Hemoglobin: 12.9 g/dL (ref 12.0–15.0)
Sodium: 112 mEq/L — CL (ref 135–145)
TCO2: 18 mmol/L (ref 0–100)

## 2010-11-01 LAB — URINALYSIS, ROUTINE W REFLEX MICROSCOPIC
Bilirubin Urine: NEGATIVE
Glucose, UA: NEGATIVE mg/dL
Hgb urine dipstick: NEGATIVE
Ketones, ur: NEGATIVE mg/dL
Nitrite: NEGATIVE
Protein, ur: NEGATIVE mg/dL
Specific Gravity, Urine: 1.011 (ref 1.005–1.030)
Urobilinogen, UA: 0.2 mg/dL (ref 0.0–1.0)
pH: 5.5 (ref 5.0–8.0)

## 2010-11-01 LAB — COMPREHENSIVE METABOLIC PANEL
ALT: 33 U/L (ref 0–35)
Alkaline Phosphatase: 55 U/L (ref 39–117)
CO2: 21 mEq/L (ref 19–32)
Glucose, Bld: 91 mg/dL (ref 70–99)
Potassium: 3.6 mEq/L (ref 3.5–5.1)
Sodium: 114 mEq/L — CL (ref 135–145)
Total Protein: 5.5 g/dL — ABNORMAL LOW (ref 6.0–8.3)

## 2010-11-01 LAB — ETHANOL: Alcohol, Ethyl (B): 57 mg/dL — ABNORMAL HIGH (ref 0–10)

## 2010-11-01 LAB — BASIC METABOLIC PANEL
BUN: 3 mg/dL — ABNORMAL LOW (ref 6–23)
BUN: 3 mg/dL — ABNORMAL LOW (ref 6–23)
BUN: 4 mg/dL — ABNORMAL LOW (ref 6–23)
BUN: 5 mg/dL — ABNORMAL LOW (ref 6–23)
CO2: 23 mEq/L (ref 19–32)
CO2: 24 mEq/L (ref 19–32)
CO2: 25 mEq/L (ref 19–32)
CO2: 31 mEq/L (ref 19–32)
Calcium: 9 mg/dL (ref 8.4–10.5)
Calcium: 9.5 mg/dL (ref 8.4–10.5)
Chloride: 87 mEq/L — ABNORMAL LOW (ref 96–112)
Chloride: 95 mEq/L — ABNORMAL LOW (ref 96–112)
Chloride: 97 mEq/L (ref 96–112)
Creatinine, Ser: 0.54 mg/dL (ref 0.4–1.2)
Creatinine, Ser: 0.55 mg/dL (ref 0.4–1.2)
GFR calc Af Amer: >60 mL/min (ref >60)
GFR calc non Af Amer: >60 mL/min (ref >60)
GFR calc non Af Amer: >60 mL/min (ref >60)
GFR calc non Af Amer: >60 mL/min (ref >60)
GFR calc non Af Amer: >60 mL/min (ref >60)
Glucose, Bld: 104 mg/dL — ABNORMAL HIGH (ref 70–99)
Glucose, Bld: 121 mg/dL — ABNORMAL HIGH (ref 70–99)
Glucose, Bld: 139 mg/dL — ABNORMAL HIGH (ref 70–99)
Glucose, Bld: 141 mg/dL — ABNORMAL HIGH (ref 70–99)
Potassium: 4 mEq/L (ref 3.5–5.1)
Potassium: 4.1 mEq/L (ref 3.5–5.1)
Potassium: 4.3 mEq/L (ref 3.5–5.1)
Potassium: 4.4 mEq/L (ref 3.5–5.1)
Sodium: 115 mEq/L — CL (ref 135–145)
Sodium: 126 mEq/L — ABNORMAL LOW (ref 135–145)
Sodium: 130 mEq/L — ABNORMAL LOW (ref 135–145)
Sodium: 131 mEq/L — ABNORMAL LOW (ref 135–145)
Sodium: 136 mEq/L (ref 135–145)

## 2010-11-01 LAB — PROTIME-INR: Prothrombin Time: 13.1 seconds (ref 11.6–15.2)

## 2010-11-01 LAB — URIC ACID: Uric Acid, Serum: 2.1 mg/dL — ABNORMAL LOW (ref 2.4–7.0)

## 2010-11-01 LAB — DIFFERENTIAL
Basophils Relative: 0 % (ref 0–1)
Eosinophils Absolute: 0.1 10*3/uL (ref 0.0–0.7)
Monocytes Relative: 10 % (ref 3–12)
Neutrophils Relative %: 79 % — ABNORMAL HIGH (ref 43–77)
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
## 2010-12-07 ENCOUNTER — Encounter: Payer: Self-pay | Admitting: Internal Medicine

## 2010-12-13 ENCOUNTER — Encounter: Payer: Self-pay | Admitting: Internal Medicine

## 2010-12-13 ENCOUNTER — Ambulatory Visit (INDEPENDENT_AMBULATORY_CARE_PROVIDER_SITE_OTHER): Payer: BC Managed Care – PPO | Admitting: Internal Medicine

## 2010-12-13 DIAGNOSIS — J9610 Chronic respiratory failure, unspecified whether with hypoxia or hypercapnia: Secondary | ICD-10-CM

## 2010-12-13 DIAGNOSIS — J4489 Other specified chronic obstructive pulmonary disease: Secondary | ICD-10-CM

## 2010-12-13 DIAGNOSIS — J449 Chronic obstructive pulmonary disease, unspecified: Secondary | ICD-10-CM

## 2010-12-13 NOTE — Patient Instructions (Signed)
1) You do not need to wear 02 with activities inside the house but definitely wear it when you go out @ 2lpm   2) Continue to wear the 02 at bedtime automatically @ 2lpm - if you feel you no longer need it then we need to repeat your 02 level on room air overnight to be sure about this before stopping the night time 02 - call us and we can arrange it.  3) Weight control is simply a matter of calorie balance which needs to be tilted in your favor by eating less and exercising more. To get the most out of exercise, you need to be continuously aware that you are short of breath, but never out of breath, for 30 minutes daily. As you improve, it will actually be easier for you to do the same amount in 30 minutes so always push to the level where you are short of breath. If this does not result in gradual weight reduction, I recommend a nutritionist for a food diary   4) See calendar for specific medication instructions and bring it back for each and every office visit for every healthcare provider you see. Without it, you may not receive the best quality medical care that we feel you deserve.   5) Return one month to see See Tammy NP w/in 3 months with all your medications, even over the counter meds, separated in two separate bags, the ones you take no matter what vs the ones you stop once you feel better and take only as needed when you feel you need them.   Tammy  will generate for you a new user friendly medication calendar that will put [place] all on the same page re: your medication use.     Without this process, it simply isn't possible to assure that we are providing  your outpatient care  with  the attention to detail we feel you deserve.   If we cannot assure that you're getting that kind of care,  then we cannot manage your problem effectively from this clinic.  Once you have seen Tammy and we are sure that we're all on the same page with your medication use she will arrange follow up with me in 3  months

## 2010-12-13 NOTE — Assessment & Plan Note (Signed)
DDX of  difficult airways managment all start with A and  include Adherence, Ace Inhibitors, Acid Reflux, Active Sinus Disease, Alpha 1 Antitripsin deficiency, Anxiety masquerading as Airways dz,  ABPA,  allergy(esp in young), Aspiration (esp in elderly), Adverse effects of DPI,  Active smokers, plus two Bs  = Bronchiectasis and Beta blocker use..and one C= CHF   Adherence is always the initial "prime suspect" and is a multilayered concern that requires a "trust but verify" approach in every patient - starting with knowing how to use medications, especially inhalers, correctly, keeping up with refills and understanding the fundamental difference between maintenance and prns vs those medications only taken for a very short course and then stopped and not refilled.   This was done in the context of a medication calendar review which provided the patient with a user-friendly unambiguous mechanism for medication administration and reconciliation and provides an action plan for all active problems. It is critical that this be shown to every doctor  for modification during the office visit if necessary so the patient can use it as a working document.  Pt still not really buying into this concept and taking advantage of the prn list of meds.  I emphasized to the patient that just like Dorothy in [employer],  She is already wearing "ruby slippers" she can click anytime she wants:  the answer to all her recurrent symptoms  is already  literally at her fingertips:   all she has to do is refer to the medicine calendar we provided her  and her problems  are each addressed in a user-friendly format, reading from left to right,  for each symptoms she's likely to develop between office visits.

## 2010-12-13 NOTE — Assessment & Plan Note (Signed)
Ok on present rx 

## 2010-12-13 NOTE — Progress Notes (Signed)
Subjective:     Patient ID: Beth Meyers, female   DOB: Jan 03, 1951, 60 y.o.   MRN: [id number]  HPI   History of Present Illness:  18 yowf quit smoking 1994 sob at time quit but much worse since.   Dec 03, 2009 cc doe x much worse x 2 years on 02 since 10/2009 assoc with cough and wheeze. Better laying down. sometimes better with nebulizer, sometimes sob at rest assoc with lots of daytime coughing and heart burn despite PPI rx. no purulent sputum. presently sob x room to room does not use 02 daytime.   Dec 17, 2009--Presents for follow up and med reivew. She is doing better. But cough is not gone yet.  Last visit CXR w/ no acute changes . Lisinopril and Spiriva stopped. Started on Benicar.  Started on prednisone 20mg  3 weeks ago for breathing problems. Has been out of work  On average drink 12-18 beers , stopped last week now drinking non alcohol beer for last week 10-18 daily. We reviewed meds and organized into a med calendar.   January 20, 2010 cc t her breathing is the same- no better or worse. No new complaints today. has trouble walking back from out building to house due to sob. FEV1 .92 > added spiriva   March 17, 2010 4 wk followup. Pt states that her breathing is the same since last seen- no better or worse. She states that her cough is worse over the past 1 wk- prod with white/clear clear sputum. States that she wakes up sometimes in the night with coughing spells- sometimes to the point where she gags. Confused with instructions on maint vs prns>>Rx Symbicort, and stopped duoneb>changed to xopenex as needed  April 04, 2010--Presents for follow up and med review. Unfortunately only brought Med calendar. We reviewd this and updated a new list. She is feelimg better w/ decreaesd cough and wheeizng. Last visit symbicort added. She does continue to wear easily with minimal activity. Waiting for pulmonary rehab > started it 04/2010 but desats with ex   May 11, 2010 cc doe same, says  sats are dropping with ex and requesting amb 02 . overall ex tol improving   Meyers 30, 2011 --Presents for follow up and med review. We reviewed her meds along with her husband. She does not appear to know her meds very well. Not sure if she is taking benicar or not. Still using BC powder despite recommendations to stop in past. Husband says recently changed from prilosec to zantac however also taking pepcid. We organized her meds and updated her med calendar. She signed this and verbalized understanding to bring to each visit. She feels her breathing is doing well, enjoying pulm. rehab > finished.   August 26, 2010 ov cc doe x 150 ft but not using 02 as instructed, husband concerned becoming too sedentary and wants a handicap sticker. no rest or noct symptoms or cough. rec follow the calendar reviewed line by line.  12/13/2010 ov/Raye Wiens cc no change doe, not using qhs 02 (don't feel I need it anymore)  sometimes cough but not following the contingencies for prn xopenex/ mucinex dm ("you told me to stop those didn't you? )  When showed the calendar she signed she could not read the contingencies back to me nor name any of the meds she takes prn in answer to specific questions suggesting she either can't read or see the instructions clearly - to sort these out suggested she bring in her glasses  so we could read it together and her daughter, who had not been present in any of the above efforts to do med reconciliation, became irrate "know one talks to my mother like that"   Pt  " pay no attention to her/ she doesn't deal with my medications, my husband does....  Husband brought back and again attempted med reconciliation with head administrator present expressing our concern we get get her meds right.  Pt denies any significant sore throat, dysphagia, itching, sneezing,  nasal congestion or excess/ purulent secretions,  fever, chills, sweats, unintended wt loss, pleuritic or exertional cp, hempoptysis,  orthopnea pnd or leg swelling.    Also denies any obvious fluctuation of symptoms with weather or environmental changes or other aggravating or alleviating factors.     Allergies 1) ! Pcn   Past Medical History:  LUMBAGO (ICD-724.2)  CHRONIC OBSTRUCTIVE PULMONARY DISEASE (ICD-496)  - PFT's January 20, 2010 FEV1 0.92 (45%) ratio 53 DLC0 41 > 82%  -02/17/10 refer to pulm rehab  - HFA 50% March 18, 2010 > 75% May 11, 2010  - Walking sats 88 RA p one lap August 26, 2010 > 2lpm ok x 2 laps 12/13/2010  HYPERTENSION  - ACE d/c 12/03/09 > no better January 20, 2010  COMPLEX MED REGIMEN  -----Med review/ pt education and computerized med calendar April 04, 2010 but did not bring meds, Meyers 30, 2011 , signed copy in [place],     Review of Systems     Objective:   Physical Exam wt 150 Dec 03, 2009>160 02/17/10 > 160 Meyers 30, 2011> 161 August 26, 2010 > 173 12/13/2010  depressed appearing amb wf nad  HEENT mild turbinate edema. Oropharynx no thrush or excess pnd or cobblestoning. No JVD or cervical adenopathy. Mild accessory muscle hypertrophy. Trachea midline, nl thryroid. Chest was hyperinflated by percussion with diminished breath sounds and moderate increased exp time without wheeze. Regular rate and rhythm without murmur gallop or rub or increase P2 or edema. Abd: no hsm, nl excursion. Ext warm without cyanosis or clubbing    Assessment:         Plan:

## 2011-02-05 ENCOUNTER — Other Ambulatory Visit: Payer: Self-pay | Admitting: Adult Health

## 2011-05-05 ENCOUNTER — Other Ambulatory Visit: Payer: Self-pay | Admitting: Adult Health

## 2012-01-07 ENCOUNTER — Other Ambulatory Visit: Payer: Self-pay | Admitting: Adult Health

## 2012-01-16 ENCOUNTER — Other Ambulatory Visit: Payer: Self-pay | Admitting: Adult Health

## 2012-01-16 MED ORDER — BUDESONIDE-FORMOTEROL FUMARATE 160-4.5 MCG/ACT IN AERO: 2.0000 | INHALATION_SPRAY | Freq: Two times a day (BID) | RESPIRATORY_TRACT | Status: DC

## 2012-01-16 NOTE — Telephone Encounter (Signed)
[facility] requesting 90 day supply of  symbicort  160/4.5 <> inhale 2 puffs  Am and 2 puffs 12 hours later.  #3

## 2012-08-19 ENCOUNTER — Other Ambulatory Visit: Payer: Self-pay | Admitting: Adult Health

## 2012-08-19 NOTE — Telephone Encounter (Signed)
Received electronic refill request for symbicort 160-4.35mcg.  Pt last seen by MW 11/2010, was rec'd to follow up in 2 months.  Has yet to be seen.

## 2015-03-28 ENCOUNTER — Inpatient Hospital Stay (HOSPITAL_COMMUNITY)
Admission: EM | Admit: 2015-03-28 | Discharge: 2015-04-09 | DRG: 896 | Disposition: A | Discharge status: Skilled Nursing Facility | Payer: Medicare Other | Attending: Internal Medicine | Admitting: Internal Medicine

## 2015-03-28 ENCOUNTER — Encounter (HOSPITAL_COMMUNITY): Payer: Self-pay | Admitting: Emergency Medicine

## 2015-03-28 ENCOUNTER — Emergency Department (HOSPITAL_COMMUNITY): Payer: Medicare Other

## 2015-03-28 DIAGNOSIS — Z79899 Other long term (current) drug therapy: Secondary | ICD-10-CM

## 2015-03-28 DIAGNOSIS — K7290 Hepatic failure, unspecified without coma: Secondary | ICD-10-CM | POA: Diagnosis present

## 2015-03-28 DIAGNOSIS — J189 Pneumonia, unspecified organism: Secondary | ICD-10-CM

## 2015-03-28 DIAGNOSIS — T380X5A Adverse effect of glucocorticoids and synthetic analogues, initial encounter: Secondary | ICD-10-CM | POA: Diagnosis present

## 2015-03-28 DIAGNOSIS — F419 Anxiety disorder, unspecified: Secondary | ICD-10-CM | POA: Diagnosis present

## 2015-03-28 DIAGNOSIS — I10 Essential (primary) hypertension: Secondary | ICD-10-CM | POA: Diagnosis present

## 2015-03-28 DIAGNOSIS — K219 Gastro-esophageal reflux disease without esophagitis: Secondary | ICD-10-CM | POA: Diagnosis present

## 2015-03-28 DIAGNOSIS — R6521 Severe sepsis with septic shock: Secondary | ICD-10-CM | POA: Diagnosis not present

## 2015-03-28 DIAGNOSIS — R0602 Shortness of breath: Secondary | ICD-10-CM | POA: Diagnosis present

## 2015-03-28 DIAGNOSIS — J441 Chronic obstructive pulmonary disease with (acute) exacerbation: Secondary | ICD-10-CM | POA: Diagnosis present

## 2015-03-28 DIAGNOSIS — N390 Urinary tract infection, site not specified: Secondary | ICD-10-CM | POA: Diagnosis present

## 2015-03-28 DIAGNOSIS — Z791 Long term (current) use of non-steroidal anti-inflammatories (NSAID): Secondary | ICD-10-CM

## 2015-03-28 DIAGNOSIS — G4700 Insomnia, unspecified: Secondary | ICD-10-CM | POA: Diagnosis present

## 2015-03-28 DIAGNOSIS — Z8249 Family history of ischemic heart disease and other diseases of the circulatory system: Secondary | ICD-10-CM | POA: Diagnosis not present

## 2015-03-28 DIAGNOSIS — F10231 Alcohol dependence with withdrawal delirium: Principal | ICD-10-CM | POA: Diagnosis present

## 2015-03-28 DIAGNOSIS — F10230 Alcohol dependence with withdrawal, uncomplicated: Secondary | ICD-10-CM | POA: Diagnosis not present

## 2015-03-28 DIAGNOSIS — E872 Acidosis: Secondary | ICD-10-CM | POA: Diagnosis not present

## 2015-03-28 DIAGNOSIS — E512 Wernicke's encephalopathy: Secondary | ICD-10-CM | POA: Diagnosis present

## 2015-03-28 DIAGNOSIS — G9340 Encephalopathy, unspecified: Secondary | ICD-10-CM | POA: Diagnosis not present

## 2015-03-28 DIAGNOSIS — J690 Pneumonitis due to inhalation of food and vomit: Secondary | ICD-10-CM | POA: Diagnosis not present

## 2015-03-28 DIAGNOSIS — B9620 Unspecified Escherichia coli [E. coli] as the cause of diseases classified elsewhere: Secondary | ICD-10-CM | POA: Diagnosis present

## 2015-03-28 DIAGNOSIS — Z9981 Dependence on supplemental oxygen: Secondary | ICD-10-CM

## 2015-03-28 DIAGNOSIS — Z880 Allergy status to penicillin: Secondary | ICD-10-CM

## 2015-03-28 DIAGNOSIS — J9601 Acute respiratory failure with hypoxia: Secondary | ICD-10-CM | POA: Diagnosis not present

## 2015-03-28 DIAGNOSIS — E876 Hypokalemia: Secondary | ICD-10-CM | POA: Diagnosis present

## 2015-03-28 DIAGNOSIS — E46 Unspecified protein-calorie malnutrition: Secondary | ICD-10-CM | POA: Diagnosis present

## 2015-03-28 DIAGNOSIS — Z7982 Long term (current) use of aspirin: Secondary | ICD-10-CM | POA: Diagnosis not present

## 2015-03-28 DIAGNOSIS — R400 Somnolence: Secondary | ICD-10-CM | POA: Diagnosis not present

## 2015-03-28 DIAGNOSIS — T17908A Unspecified foreign body in respiratory tract, part unspecified causing other injury, initial encounter: Secondary | ICD-10-CM

## 2015-03-28 DIAGNOSIS — D6489 Other specified anemias: Secondary | ICD-10-CM | POA: Diagnosis present

## 2015-03-28 DIAGNOSIS — D649 Anemia, unspecified: Secondary | ICD-10-CM | POA: Diagnosis present

## 2015-03-28 DIAGNOSIS — M545 Low back pain: Secondary | ICD-10-CM | POA: Diagnosis present

## 2015-03-28 DIAGNOSIS — F329 Major depressive disorder, single episode, unspecified: Secondary | ICD-10-CM | POA: Diagnosis present

## 2015-03-28 DIAGNOSIS — Z825 Family history of asthma and other chronic lower respiratory diseases: Secondary | ICD-10-CM | POA: Diagnosis not present

## 2015-03-28 DIAGNOSIS — Z6835 Body mass index (BMI) 35.0-35.9, adult: Secondary | ICD-10-CM

## 2015-03-28 DIAGNOSIS — F10239 Alcohol dependence with withdrawal, unspecified: Secondary | ICD-10-CM | POA: Diagnosis present

## 2015-03-28 DIAGNOSIS — R4182 Altered mental status, unspecified: Secondary | ICD-10-CM

## 2015-03-28 DIAGNOSIS — Z87891 Personal history of nicotine dependence: Secondary | ICD-10-CM

## 2015-03-28 DIAGNOSIS — Z9119 Patient's noncompliance with other medical treatment and regimen: Secondary | ICD-10-CM | POA: Diagnosis present

## 2015-03-28 DIAGNOSIS — T501X5A Adverse effect of loop [high-ceiling] diuretics, initial encounter: Secondary | ICD-10-CM | POA: Diagnosis present

## 2015-03-28 DIAGNOSIS — K56609 Unspecified intestinal obstruction, unspecified as to partial versus complete obstruction: Secondary | ICD-10-CM

## 2015-03-28 DIAGNOSIS — J9690 Respiratory failure, unspecified, unspecified whether with hypoxia or hypercapnia: Secondary | ICD-10-CM

## 2015-03-28 DIAGNOSIS — A419 Sepsis, unspecified organism: Secondary | ICD-10-CM | POA: Diagnosis not present

## 2015-03-28 DIAGNOSIS — R7989 Other specified abnormal findings of blood chemistry: Secondary | ICD-10-CM

## 2015-03-28 DIAGNOSIS — R410 Disorientation, unspecified: Secondary | ICD-10-CM | POA: Diagnosis not present

## 2015-03-28 DIAGNOSIS — Z978 Presence of other specified devices: Secondary | ICD-10-CM

## 2015-03-28 DIAGNOSIS — R739 Hyperglycemia, unspecified: Secondary | ICD-10-CM | POA: Diagnosis present

## 2015-03-28 DIAGNOSIS — K567 Ileus, unspecified: Secondary | ICD-10-CM | POA: Diagnosis present

## 2015-03-28 DIAGNOSIS — R062 Wheezing: Secondary | ICD-10-CM

## 2015-03-28 DIAGNOSIS — E785 Hyperlipidemia, unspecified: Secondary | ICD-10-CM | POA: Diagnosis present

## 2015-03-28 DIAGNOSIS — R0603 Acute respiratory distress: Secondary | ICD-10-CM

## 2015-03-28 DIAGNOSIS — Z4659 Encounter for fitting and adjustment of other gastrointestinal appliance and device: Secondary | ICD-10-CM

## 2015-03-28 DIAGNOSIS — F10939 Alcohol use, unspecified with withdrawal, unspecified: Secondary | ICD-10-CM | POA: Diagnosis present

## 2015-03-28 HISTORY — DX: Depression, unspecified: F32.A

## 2015-03-28 HISTORY — DX: Major depressive disorder, single episode, unspecified: F32.9

## 2015-03-28 HISTORY — DX: Gastro-esophageal reflux disease without esophagitis: K21.9

## 2015-03-28 HISTORY — DX: Anxiety disorder, unspecified: F41.9

## 2015-03-28 LAB — CBC WITH DIFFERENTIAL/PLATELET
BASOS ABS: 0 10*3/uL (ref 0.0–0.1)
BASOS PCT: 0 % (ref 0–1)
Basophils Absolute: 0 10*3/uL (ref 0.0–0.1)
Basophils Relative: 0 % (ref 0–1)
EOS ABS: 0 10*3/uL (ref 0.0–0.7)
EOS ABS: 0 10*3/uL (ref 0.0–0.7)
EOS PCT: 0 % (ref 0–5)
Eosinophils Relative: 0 % (ref 0–5)
HCT: 36.9 % (ref 36.0–46.0)
HCT: 31.8 % — ABNORMAL LOW (ref 36.0–46.0)
HEMOGLOBIN: 11.7 g/dL — AB (ref 12.0–15.0)
Hemoglobin: 10.4 g/dL — ABNORMAL LOW (ref 12.0–15.0)
LYMPHS ABS: 0.4 10*3/uL — AB (ref 0.7–4.0)
LYMPHS PCT: 7 % — AB (ref 12–46)
Lymphocytes Relative: 10 % — ABNORMAL LOW (ref 12–46)
Lymphs Abs: 0.7 10*3/uL (ref 0.7–4.0)
MCH: 28.7 pg (ref 26.0–34.0)
MCH: 29.4 pg (ref 26.0–34.0)
MCHC: 31.7 g/dL (ref 30.0–36.0)
MCHC: 32.7 g/dL (ref 30.0–36.0)
MCV: 90.4 fL (ref 78.0–100.0)
MCV: 89.8 fL (ref 78.0–100.0)
MONO ABS: 0 10*3/uL — AB (ref 0.1–1.0)
MONOS PCT: 4 % (ref 3–12)
Monocytes Absolute: 0.2 10*3/uL (ref 0.1–1.0)
Monocytes Relative: 0 % — ABNORMAL LOW (ref 3–12)
NEUTROS PCT: 86 % — AB (ref 43–77)
Neutro Abs: 5.8 10*3/uL (ref 1.7–7.7)
Neutro Abs: 5.5 10*3/uL (ref 1.7–7.7)
Neutrophils Relative %: 93 % — ABNORMAL HIGH (ref 43–77)
PLATELETS: 280 10*3/uL (ref 150–400)
Platelets: 357 10*3/uL (ref 150–400)
RBC: 4.08 MIL/uL (ref 3.87–5.11)
RBC: 3.54 MIL/uL — AB (ref 3.87–5.11)
RDW: 14.6 % (ref 11.5–15.5)
RDW: 14.8 % (ref 11.5–15.5)
WBC: 6.8 10*3/uL (ref 4.0–10.5)
WBC: 5.9 10*3/uL (ref 4.0–10.5)

## 2015-03-28 LAB — COMPREHENSIVE METABOLIC PANEL
ALBUMIN: 3.4 g/dL — AB (ref 3.5–5.0)
ALK PHOS: 70 U/L (ref 38–126)
ALT: 16 U/L (ref 14–54)
ALT: 18 U/L (ref 14–54)
ANION GAP: 18 — AB (ref 5–15)
ANION GAP: 14 (ref 5–15)
AST: 36 U/L (ref 15–41)
AST: 44 U/L — AB (ref 15–41)
Albumin: 2.9 g/dL — ABNORMAL LOW (ref 3.5–5.0)
Alkaline Phosphatase: 60 U/L (ref 38–126)
BILIRUBIN TOTAL: 0.8 mg/dL (ref 0.3–1.2)
BUN: 13 mg/dL (ref 6–20)
BUN: 15 mg/dL (ref 6–20)
CHLORIDE: 97 mmol/L — AB (ref 101–111)
CO2: 24 mmol/L (ref 22–32)
CO2: 25 mmol/L (ref 22–32)
Calcium: 8.3 mg/dL — ABNORMAL LOW (ref 8.9–10.3)
Calcium: 7.7 mg/dL — ABNORMAL LOW (ref 8.9–10.3)
Chloride: 93 mmol/L — ABNORMAL LOW (ref 101–111)
Creatinine, Ser: 0.83 mg/dL (ref 0.44–1.00)
Creatinine, Ser: 0.84 mg/dL (ref 0.44–1.00)
GFR calc Af Amer: >60 mL/min (ref >60)
GFR calc non Af Amer: >60 mL/min (ref >60)
GLUCOSE: 144 mg/dL — AB (ref 65–99)
Glucose, Bld: 153 mg/dL — ABNORMAL HIGH (ref 65–99)
POTASSIUM: 3.3 mmol/L — AB (ref 3.5–5.1)
POTASSIUM: 2.9 mmol/L — AB (ref 3.5–5.1)
SODIUM: 135 mmol/L (ref 135–145)
Sodium: 136 mmol/L (ref 135–145)
TOTAL PROTEIN: 6.1 g/dL — AB (ref 6.5–8.1)
Total Bilirubin: 0.5 mg/dL (ref 0.3–1.2)
Total Protein: 7.1 g/dL (ref 6.5–8.1)

## 2015-03-28 LAB — BLOOD GAS, ARTERIAL
ACID-BASE DEFICIT: 1.9 mmol/L (ref 0.0–2.0)
Acid-base deficit: 1.9 mmol/L (ref 0.0–2.0)
BICARBONATE: 20.3 meq/L (ref 20.0–24.0)
BICARBONATE: 21.3 meq/L (ref 20.0–24.0)
DRAWN BY: 331471
Drawn by: 308601
O2 CONTENT: 3 L/min
O2 CONTENT: 3 L/min
O2 SAT: 96.8 %
O2 Saturation: 95.7 %
PATIENT TEMPERATURE: 98.6
PCO2 ART: 32.5 mmHg — AB (ref 35.0–45.0)
PH ART: 7.476 — AB (ref 7.350–7.450)
PO2 ART: 103 mmHg — AB (ref 80.0–100.0)
Patient temperature: 98.6
TCO2: 18.2 mmol/L (ref 0–100)
TCO2: 19.5 mmol/L (ref 0–100)
pCO2 arterial: 27.8 mmHg — ABNORMAL LOW (ref 35.0–45.0)
pH, Arterial: 7.432 (ref 7.350–7.450)
pO2, Arterial: 87.9 mmHg (ref 80.0–100.0)

## 2015-03-28 LAB — URINALYSIS, ROUTINE W REFLEX MICROSCOPIC
Bilirubin Urine: NEGATIVE
GLUCOSE, UA: NEGATIVE mg/dL
HGB URINE DIPSTICK: NEGATIVE
Ketones, ur: 40 mg/dL — AB
Leukocytes, UA: NEGATIVE
Nitrite: NEGATIVE
Protein, ur: NEGATIVE mg/dL
SPECIFIC GRAVITY, URINE: 1.012 (ref 1.005–1.030)
Urobilinogen, UA: 0.2 mg/dL (ref 0.0–1.0)
pH: 5 (ref 5.0–8.0)

## 2015-03-28 LAB — MAGNESIUM: Magnesium: 2.1 mg/dL (ref 1.7–2.4)

## 2015-03-28 LAB — RAPID URINE DRUG SCREEN, HOSP PERFORMED
AMPHETAMINES: NOT DETECTED
BARBITURATES: NOT DETECTED
BENZODIAZEPINES: NOT DETECTED
Cocaine: NOT DETECTED
Opiates: NOT DETECTED
TETRAHYDROCANNABINOL: NOT DETECTED

## 2015-03-28 LAB — AMMONIA: AMMONIA: 12 umol/L (ref 9–35)

## 2015-03-28 LAB — I-STAT CG4 LACTIC ACID, ED: Lactic Acid, Venous: 2.12 mmol/L (ref 0.5–2.0)

## 2015-03-28 LAB — I-STAT TROPONIN, ED: TROPONIN I, POC: 0 ng/mL (ref 0.00–0.08)

## 2015-03-28 LAB — TSH: TSH: 0.335 u[IU]/mL — AB (ref 0.350–4.500)

## 2015-03-28 LAB — MRSA PCR SCREENING: MRSA BY PCR: NEGATIVE

## 2015-03-28 LAB — BRAIN NATRIURETIC PEPTIDE: B Natriuretic Peptide: 70.7 pg/mL (ref 0.0–100.0)

## 2015-03-28 LAB — ETHANOL

## 2015-03-28 LAB — PHOSPHORUS: PHOSPHORUS: 3.5 mg/dL (ref 2.5–4.6)

## 2015-03-28 MED ORDER — IPRATROPIUM BROMIDE 0.02 % IN SOLN
0.5000 mg | Freq: Once | RESPIRATORY_TRACT | Status: AC
  Administered 2015-03-28: 0.5 mg via RESPIRATORY_TRACT
  Filled 2015-03-28: qty 2.5

## 2015-03-28 MED ORDER — FENOFIBRATE 160 MG PO TABS
160.0000 mg | ORAL_TABLET | Freq: Every day | ORAL | Status: DC
  Filled 2015-03-28 (×2): qty 1

## 2015-03-28 MED ORDER — IPRATROPIUM-ALBUTEROL 0.5-2.5 (3) MG/3ML IN SOLN
3.0000 mL | Freq: Four times a day (QID) | RESPIRATORY_TRACT | Status: DC
  Administered 2015-03-28 – 2015-03-29 (×3): 3 mL via RESPIRATORY_TRACT
  Filled 2015-03-28 (×4): qty 3

## 2015-03-28 MED ORDER — SODIUM CHLORIDE 0.9 % IJ SOLN
3.0000 mL | Freq: Two times a day (BID) | INTRAMUSCULAR | Status: DC
  Administered 2015-03-28 – 2015-04-06 (×17): 3 mL via INTRAVENOUS

## 2015-03-28 MED ORDER — LORAZEPAM 2 MG/ML IJ SOLN
0.5000 mg | Freq: Once | INTRAMUSCULAR | Status: AC
  Administered 2015-03-28: 0.5 mg via INTRAVENOUS
  Filled 2015-03-28: qty 1

## 2015-03-28 MED ORDER — ONDANSETRON HCL 4 MG/2ML IJ SOLN
4.0000 mg | Freq: Once | INTRAMUSCULAR | Status: AC
  Administered 2015-03-28: 4 mg via INTRAVENOUS
  Filled 2015-03-28: qty 2

## 2015-03-28 MED ORDER — IPRATROPIUM-ALBUTEROL 0.5-2.5 (3) MG/3ML IN SOLN
3.0000 mL | RESPIRATORY_TRACT | Status: DC | PRN
  Administered 2015-03-28 – 2015-04-03 (×4): 3 mL via RESPIRATORY_TRACT
  Filled 2015-03-28 (×3): qty 3

## 2015-03-28 MED ORDER — CITALOPRAM HYDROBROMIDE 20 MG PO TABS: 40.0000 mg | ORAL_TABLET | Freq: Every day | ORAL | Status: DC

## 2015-03-28 MED ORDER — ACETAMINOPHEN 325 MG PO TABS: 650.0000 mg | ORAL_TABLET | Freq: Four times a day (QID) | ORAL | Status: DC | PRN

## 2015-03-28 MED ORDER — HALOPERIDOL LACTATE 5 MG/ML IJ SOLN
2.5000 mg | Freq: Once | INTRAMUSCULAR | Status: AC
  Administered 2015-03-28: 2.5 mg via INTRAVENOUS
  Filled 2015-03-28: qty 1

## 2015-03-28 MED ORDER — HYDRALAZINE HCL 20 MG/ML IJ SOLN: 10.0000 mg | Freq: Four times a day (QID) | INTRAMUSCULAR | Status: DC | PRN

## 2015-03-28 MED ORDER — ONDANSETRON HCL 4 MG/2ML IJ SOLN
4.0000 mg | Freq: Four times a day (QID) | INTRAMUSCULAR | Status: DC | PRN
  Administered 2015-04-03 – 2015-04-06 (×2): 4 mg via INTRAVENOUS
  Filled 2015-03-28 (×3): qty 2

## 2015-03-28 MED ORDER — SODIUM CHLORIDE 0.9 % IV SOLN
INTRAVENOUS | Status: DC
  Administered 2015-03-28 – 2015-04-02 (×3): via INTRAVENOUS

## 2015-03-28 MED ORDER — METHYLPREDNISOLONE SODIUM SUCC 125 MG IJ SOLR
60.0000 mg | Freq: Two times a day (BID) | INTRAMUSCULAR | Status: DC
  Administered 2015-03-28 – 2015-03-29 (×2): 60 mg via INTRAVENOUS
  Filled 2015-03-28 (×2): qty 2

## 2015-03-28 MED ORDER — FUROSEMIDE 40 MG PO TABS
40.0000 mg | ORAL_TABLET | Freq: Two times a day (BID) | ORAL | Status: DC
  Administered 2015-03-29: 40 mg via ORAL
  Filled 2015-03-28: qty 1

## 2015-03-28 MED ORDER — SODIUM CHLORIDE 0.9 % IV SOLN
Freq: Once | INTRAVENOUS | Status: AC
  Administered 2015-03-28: 13:00:00 via INTRAVENOUS

## 2015-03-28 MED ORDER — LORAZEPAM 1 MG PO TABS: 1.0000 mg | ORAL_TABLET | Freq: Four times a day (QID) | ORAL | Status: DC | PRN

## 2015-03-28 MED ORDER — ONDANSETRON HCL 4 MG PO TABS: 4.0000 mg | ORAL_TABLET | Freq: Four times a day (QID) | ORAL | Status: DC | PRN

## 2015-03-28 MED ORDER — THIAMINE HCL 100 MG/ML IJ SOLN
100.0000 mg | Freq: Every day | INTRAMUSCULAR | Status: DC
  Administered 2015-03-29 – 2015-03-30 (×2): 100 mg via INTRAVENOUS
  Filled 2015-03-28 (×2): qty 2

## 2015-03-28 MED ORDER — LORAZEPAM 2 MG/ML IJ SOLN
1.0000 mg | Freq: Four times a day (QID) | INTRAMUSCULAR | Status: DC | PRN
  Administered 2015-03-28: 1 mg via INTRAVENOUS
  Filled 2015-03-28: qty 1

## 2015-03-28 MED ORDER — ACETAMINOPHEN 650 MG RE SUPP
650.0000 mg | Freq: Four times a day (QID) | RECTAL | Status: DC | PRN
  Administered 2015-04-03: 650 mg via RECTAL
  Filled 2015-03-28 (×2): qty 1

## 2015-03-28 MED ORDER — POTASSIUM CHLORIDE 10 MEQ/100ML IV SOLN
10.0000 meq | INTRAVENOUS | Status: AC
  Administered 2015-03-28 – 2015-03-29 (×2): 10 meq via INTRAVENOUS
  Filled 2015-03-28 (×2): qty 100

## 2015-03-28 MED ORDER — LEVALBUTEROL HCL 0.63 MG/3ML IN NEBU
0.6300 mg | INHALATION_SOLUTION | Freq: Once | RESPIRATORY_TRACT | Status: AC
  Administered 2015-03-28: 0.63 mg via RESPIRATORY_TRACT
  Filled 2015-03-28: qty 3

## 2015-03-28 MED ORDER — IOHEXOL 350 MG/ML SOLN
100.0000 mL | Freq: Once | INTRAVENOUS | Status: AC | PRN
  Administered 2015-03-28: 100 mL via INTRAVENOUS

## 2015-03-28 MED ORDER — LOSARTAN POTASSIUM 50 MG PO TABS
100.0000 mg | ORAL_TABLET | Freq: Every day | ORAL | Status: DC
  Filled 2015-03-28: qty 2

## 2015-03-28 MED ORDER — LORAZEPAM 2 MG/ML IJ SOLN
2.0000 mg | INTRAMUSCULAR | Status: DC | PRN
  Administered 2015-03-28: 3 mg via INTRAVENOUS
  Administered 2015-03-29 – 2015-03-30 (×5): 2 mg via INTRAVENOUS
  Filled 2015-03-28 (×2): qty 1
  Filled 2015-03-28: qty 2
  Filled 2015-03-28 (×4): qty 1

## 2015-03-28 MED ORDER — ADULT MULTIVITAMIN W/MINERALS CH
1.0000 | ORAL_TABLET | Freq: Every day | ORAL | Status: DC
  Filled 2015-03-28: qty 1

## 2015-03-28 MED ORDER — PANTOPRAZOLE SODIUM 40 MG PO TBEC: 40.0000 mg | DELAYED_RELEASE_TABLET | Freq: Every day | ORAL | Status: DC

## 2015-03-28 MED ORDER — POTASSIUM CHLORIDE CRYS ER 20 MEQ PO TBCR: 40.0000 meq | EXTENDED_RELEASE_TABLET | Freq: Once | ORAL | Status: DC

## 2015-03-28 MED ORDER — POTASSIUM CHLORIDE CRYS ER 20 MEQ PO TBCR
40.0000 meq | EXTENDED_RELEASE_TABLET | Freq: Once | ORAL | Status: DC
Start: 2015-03-29 — End: 2015-03-28

## 2015-03-28 MED ORDER — FOLIC ACID 1 MG PO TABS: 1.0000 mg | ORAL_TABLET | Freq: Every day | ORAL | Status: DC

## 2015-03-28 MED ORDER — VITAMIN B-1 100 MG PO TABS: 100.0000 mg | ORAL_TABLET | Freq: Every day | ORAL | Status: DC

## 2015-03-28 NOTE — ED Notes (Signed)
Dr.Wentz at bedside. Pt continues to be agitated. Pt will not allow staff to  Monitors VSS and heart rhythm.  Family at bedside.

## 2015-03-28 NOTE — H&P (Signed)
[facility] History and Physical  Beth Meyers ZOX:[id number] DOB: [birth year] DOA: 03/28/2015  Referring physician: ER physician: Dr. Christ Kick PCP: Lynne Logan, MD  Chief Complaint: agitation, respiratory distress   HPI:  64 year old female with past medical history of COPD, anxiety and depression, hypertension who presented to [facility] ED with worsening shortness of breath over past few days prior to this admission. She tried home inhaler and nebulizer treatment but without significant symptomatic relief. She had associated anxiety but no chest pain, no palpitations, No cough or fevers, No abdominal pain, nausea or vomiting. She was given solumedrol and nebulizer treatment by EMS and this seemed to help out a little.  In ED< her BP was 91/48 but has improved to 133/66 with IV fluids. Oxygen saturation was 93% on [place] oxygen support. Blood work was notable for hemoglobin of 11.7, potassium 3.3 otherwise unremarkable. UDS and alcohol level were WNL. CXR showed bronchitic changes. CT angio chest ruled out pulmonary embolism. She did have remote rib fractures identified on CT scan. She continued to feel restless and at moments agitated in ED. She was placed on CIWA protocol for possible alcohol withdrawal.   Assessment & Plan    Principal Problem:   Acute encephalopathy / Alcohol withdrawal - Unclear etiology, suspect alcohol withdrawal - Placed on CIWA protocol - UDS and alcohol were WNL - Monitor in SDU - Continue supportive care with IV fluids  Active Problems:   COPD exacerbation - Placed on duoneb every 4 hours as needed for shortness of breath or wheezing and every 6 hours scheduled - Placed on solumedrol 60 mg IV Q 12 hours; she was given 125 mg solumedrol in ED - Continue oxygen support via [place] to keep O2 saturation above 90%    Essential hypertension - Resumed losartan and lasix    Hypokalemia - Due to lasix - Supplemented - Follow up BMP tomorrow am      Dyslipidemia - Continue fenofibrate     Anxiety and depression - Ativan given as part of CIWA protocol - Continue celexa    DVT prophylaxis:  - SCD's bilaterally    Radiological Exams on Admission: Dg Chest 2 View 03/28/2015 1. Bronchitic changes.  Emphysematous changes. 2.  No focal acute pulmonary abnormality. 3. Osteoporotic type fractures of the thoracic spine. Consider evaluation and treatment for osteoporosis as appropriate.   Electronically Signed   By: Nolon Nations M.D.   On: 03/28/2015 10:50   Ct Angio Chest Pe W/cm &/or Wo Cm 03/28/2015  1. Negative for evidence of acute central pulmonary embolus, pneumonia or other acute cardiopulmonary process. 2. Moderate image degradation secondary to significant respiratory and patient related motion artifact. 3. Moderately advanced centrilobular emphysema. 4. Multiple remote right-sided rib fractures noted incidentally.   Electronically Signed   By: Jacqulynn Cadet M.D.   On: 03/28/2015 13:50    EKG: I have personally reviewed EKG. EKG shows sinus tachycardia.  Code Status: Full Family Communication: Family not at the bedside at this time  Disposition Plan: Admit for further evaluation  Leisa Lenz, MD  [facility] Pager [phone]  Time spent in minutes: 75 minutes  Review of Systems:  Constitutional: Negative for fever, chills and malaise/fatigue. Negative for diaphoresis.  HENT: Negative for hearing loss, ear pain, nosebleeds, congestion, sore throat, neck pain, tinnitus and ear discharge.   Eyes: Negative for blurred vision, double vision, photophobia, pain, discharge and redness.  Respiratory: Negative for cough, hemoptysis, sputum production, shortness of breath, wheezing and stridor.  Cardiovascular: Negative for chest pain, palpitations, orthopnea, claudication and leg swelling.  Gastrointestinal: Negative for nausea, vomiting and abdominal pain. Negative for heartburn, constipation, blood in stool and melena.   Genitourinary: Negative for dysuria, urgency, frequency, hematuria and flank pain.  Musculoskeletal: Negative for myalgias, back pain, joint pain and falls.  Skin: Negative for itching and rash.  Neurological: per HPI Endo/Heme/Allergies: Negative for environmental allergies and polydipsia. Does not bruise/bleed easily.  Psychiatric/Behavioral: Negative for suicidal ideas. The patient is not nervous/anxious.      Past Medical History  Diagnosis Date  . COPD (chronic obstructive pulmonary disease)   . Hypertension   . Lumbago   . Depression   . Anxiety   . GERD (gastroesophageal reflux disease)    History reviewed. No pertinent past surgical history. Social History:  reports that she quit smoking about 22 years ago. Her smoking use included Cigarettes. She has a 270 pack-year smoking history. She does not have any smokeless tobacco history on file. She reports that she drinks alcohol. She reports that she does not use illicit drugs.  Allergies  Allergen Reactions  . Penicillins     REACTION: hives    Family History:  Family History  Problem Relation Age of Onset  . Emphysema Brother   . Asthma Brother   . Heart disease Father   . Heart disease Brother      Prior to Admission medications   Medication Sig Start Date End Date Taking? Authorizing Provider  albuterol (PROVENTIL HFA;VENTOLIN HFA) 108 (90 BASE) MCG/ACT inhaler Inhale 2 puffs into the lungs every 6 (six) hours as needed for wheezing or shortness of breath.   Yes Historical Provider, MD  Aspirin-Salicylamide-Caffeine (BC HEADACHE POWDER PO) Take 1 Package by mouth 2 (two) times daily as needed (headache).   Yes Historical Provider, MD  Calcium Carbonate-Vitamin D (CALCIUM-VITAMIN D) 600-200 MG-UNIT CAPS Take 1 tablet by mouth daily.    Yes Historical Provider, MD  citalopram (CELEXA) 40 MG tablet Take 40 mg by mouth daily.     Yes Historical Provider, MD  famotidine-calcium carbonate-magnesium hydroxide (PEPCID  COMPLETE) 10-800-165 MG CHEW chewable tablet Chew 1 tablet by mouth daily as needed (heartburn).   Yes Historical Provider, MD  furosemide (LASIX) 40 MG tablet Take 40 mg by mouth 2 (two) times daily. 03/16/15  Yes Historical Provider, MD  ibuprofen (ADVIL) 200 MG tablet Take by mouth. Take as directed per bottle   Yes Historical Provider, MD  levalbuterol (XOPENEX) 1.25 MG/3ML nebulizer solution Take 1 ampule by nebulization every 4 (four) hours as needed for wheezing or shortness of breath.    Yes Historical Provider, MD  losartan (COZAAR) 100 MG tablet Take 100 mg by mouth daily.     Yes Historical Provider, MD  omeprazole (PRILOSEC) 20 MG capsule Take 20 mg by mouth daily.    Yes Historical Provider, MD  ondansetron (ZOFRAN-ODT) 8 MG disintegrating tablet Take 4-8 mg by mouth every 8 (eight) hours as needed for nausea.  03/11/15  Yes Historical Provider, MD  SYMBICORT 160-4.5 MCG/ACT inhaler INHALE 2 PUFFS INTO THE LUNGS TWICE A DAY 08/19/12  Yes Tammy S Parrett, NP  traZODone (DESYREL) 150 MG tablet Take 150 mg by mouth at bedtime.     Yes Historical Provider, MD  fenofibrate (TRICOR) 145 MG tablet Take 145 mg by mouth daily.      Historical Provider, MD  SPIRIVA HANDIHALER 18 MCG inhalation capsule INHALE THE CONTENTS OF 1 CAPSULE USING 2 INHALATIONS ONCE DAILY Patient  not taking: Reported on 03/28/2015 02/05/11   Melvenia Needles, NP   Physical Exam: Filed Vitals:   03/28/15 1100 03/28/15 1113 03/28/15 1230 03/28/15 1340  BP: 131/66  132/103 133/66  Pulse: 111   114  Temp:  99.5 F (37.5 C)    Resp:    24  SpO2: 93%   96%    Physical Exam  Constitutional: Appears well-developed and well-nourished. No distress.  HENT: Normocephalic. No tonsillar erythema or exudates Eyes: Conjunctivae and EOM are normal. PERRLA, no scleral icterus.  Neck: Normal ROM. Neck supple. No JVD. No tracheal deviation. No thyromegaly.  CVS: RRR, S1/S2 +, no murmurs, no gallops, no carotid bruit.  Pulmonary:  Effort and breath sounds normal, no stridor, rhonchi, wheezes, rales.  Abdominal: Soft. BS +,  no distension, tenderness, rebound or guarding.  Musculoskeletal: Normal range of motion. No edema and no tenderness.  Lymphadenopathy: No lymphadenopathy noted, cervical, inguinal. Neuro: Alert. Normal reflexes. No focal neurologic deficits. Skin: Skin is warm and dry. No rash noted.  No erythema. No pallor.  Psychiatric: Normal mood and affect. Behavior, judgment, thought content normal.   Labs on Admission:  Basic Metabolic Panel:  Recent Labs Lab 03/28/15 1055  NA 135  K 3.3*  CL 93*  CO2 24  GLUCOSE 144*  BUN 13  CREATININE 0.83  CALCIUM 8.3*   Liver Function Tests:  Recent Labs Lab 03/28/15 1055  AST 36  ALT 16  ALKPHOS 70  BILITOT 0.5  PROT 7.1  ALBUMIN 3.4*   No results for input(s): LIPASE, AMYLASE in the last 168 hours. No results for input(s): AMMONIA in the last 168 hours. CBC:  Recent Labs Lab 03/28/15 1055  WBC 6.8  NEUTROABS 5.8  HGB 11.7*  HCT 36.9  MCV 90.4  PLT 357   Cardiac Enzymes: No results for input(s): CKTOTAL, CKMB, CKMBINDEX, TROPONINI in the last 168 hours. BNP: Invalid input(s): POCBNP CBG: No results for input(s): GLUCAP in the last 168 hours.  If 7PM-7AM, please contact night-coverage [email or web address] Password [email or web address] 03/28/2015, 2:32 PM

## 2015-03-28 NOTE — Progress Notes (Signed)
Despite prn medications pt CIWA remains elevated (pt severely agitated. BP 189/88 and HR 130-140's sustained). On call MD notified and CIWA schedule changed. Pt is now resting comfortably. HR 95 and BP 126/75.

## 2015-03-28 NOTE — ED Notes (Signed)
Patient transported to X-ray 

## 2015-03-28 NOTE — ED Notes (Signed)
Bed: QT62 Expected date:  Expected time:  Means of arrival:  Comments: EMS-Shortness of Breath

## 2015-03-28 NOTE — ED Notes (Signed)
Pt returned to room from CT scan

## 2015-03-28 NOTE — ED Notes (Signed)
Attempted IV venipuncture x1 without success. Pt requiring 20g for CT scan. Will have another nurse to attempt.

## 2015-03-28 NOTE — ED Notes (Signed)
Awake. Verbally responsive. A/O x1. Resp even and sl labored. No audible adventitious breath sounds noted. ABC's intact. Pt continues to have agitation. IV's saline lock patent and intact. Family at bedside.

## 2015-03-28 NOTE — ED Notes (Signed)
I made PA aware of critical I Stat CG4

## 2015-03-28 NOTE — ED Provider Notes (Signed)
CSN: [id number]     Arrival date & time 03/28/15  8841 History   First MD Initiated Contact with Beth Meyers 03/28/15 1003     Chief Complaint  Beth Meyers presents with  . Shortness of Breath     (Consider location/radiation/quality/duration/timing/severity/associated sxs/prior Treatment) HPI Comments: Beth Meyers is a 64 y.o. female with a PMHx of COPD, HTN, anxiety/depression, GERD, and lumbago, who presents to the ED with complaints of shortness of breath x2-3 days. Pt's daughter accompanies pt and provides most of the history. Beth Meyers reports that her SOB improved somewhat with Solu-Medrol 125 mg and 2 duonebs given in route. Daughter reports she is on home oxygen, unsure if it is 2 or 3 L. Beth Meyers's daughter reports that the Beth Meyers is a heavy drinker, and that she is at her mental baseline, but she is unsure how much alcohol she may have consumed if any. The Beth Meyers reports she has a chronic cough which is unchanged, without sputum production or hemoptysis. Associated symptoms include wheezing. She denies any fevers, chills, rhinorrhea, sore throat, chest pain, leg swelling, recent travel/surgery/immobilization, OCP use, sick contacts, abdominal pain, nausea, vomiting, diarrhea, constipation, hematochezia, melena, dysuria, hematuria, numbness, tingling, or weakness. Pt reports she's compliant with meds, and does not smoke, but daughter is unsure if this is true.   Beth Meyers is a 64 y.o. female presenting with shortness of breath. The history is provided by the Beth Meyers and a relative. No language interpreter was used.  Shortness of Breath Severity:  Severe Onset quality:  Gradual Duration:  3 days Timing:  Constant Progression:  Worsening Chronicity:  Recurrent Relieved by: 2 duonebs and 125mg  solumedrol. Worsened by:  Nothing tried Ineffective treatments:  None tried Associated symptoms: cough (unchanged from chronic) and wheezing   Associated symptoms: no abdominal pain, no chest pain, no  fever, no hemoptysis, no sore throat, no sputum production and no vomiting   Risk factors: alcohol use   Risk factors: no hx of PE/DVT, no oral contraceptive use, no prolonged immobilization, no recent surgery and no tobacco use     Past Medical History  Diagnosis Date  . COPD (chronic obstructive pulmonary disease)   . Hypertension   . Lumbago    No past surgical history on file. Family History  Problem Relation Age of Onset  . Emphysema Brother   . Asthma Brother   . Heart disease Father   . Heart disease Brother    Social History  Substance Use Topics  . Smoking status: Former Smoker -- 3.00 packs/day for 90 years    Types: Cigarettes    Quit date: 07/24/1992  . Smokeless tobacco: Not on file  . Alcohol Use: Yes   OB History    No data available     Review of Systems  Constitutional: Negative for fever and chills.  HENT: Negative for rhinorrhea and sore throat.   Respiratory: Positive for cough (unchanged from chronic), shortness of breath and wheezing. Negative for hemoptysis and sputum production.   Cardiovascular: Negative for chest pain and leg swelling.  Gastrointestinal: Negative for nausea, vomiting, abdominal pain, diarrhea, constipation and blood in stool.  Genitourinary: Negative for dysuria and hematuria.  Musculoskeletal: Negative for myalgias and arthralgias.  Skin: Negative for color change.  Allergic/Immunologic: Negative for immunocompromised state.  Neurological: Negative for weakness and numbness.  Psychiatric/Behavioral: Negative for confusion (per daughter, she drinks heavily, but she is at her baseline).   10 Systems reviewed and are negative for acute change except as noted in the HPI.  Allergies  Penicillins  Home Medications   Prior to Admission medications   Medication Sig Start Date End Date Taking? Authorizing Provider  Calcium Carbonate-Vitamin D (CALCIUM-VITAMIN D) 600-200 MG-UNIT CAPS Take one by mouth twice daily     Historical  Provider, MD  citalopram (CELEXA) 40 MG tablet Take 40 mg by mouth daily.      Historical Provider, MD  clonazePAM (KLONOPIN) 0.5 MG tablet Take 1 tablet by mouth 2 (two) times daily as needed. 12/03/10   Historical Provider, MD  dextromethorphan-guaiFENesin (MUCINEX DM) 30-600 MG per 12 hr tablet Take 1-2 tablets every 12 hours as needed     Historical Provider, MD  fenofibrate (TRICOR) 145 MG tablet Take 145 mg by mouth daily.      Historical Provider, MD  ibuprofen (ADVIL) 200 MG tablet Take as directed per bottle     Historical Provider, MD  levalbuterol (XOPENEX HFA) 45 MCG/ACT inhaler Inhale 2 puffs into the lungs every 4 (four) hours as needed.      Historical Provider, MD  levalbuterol Penne Lash) 1.25 MG/3ML nebulizer solution Take 1 ampule by nebulization every 4 (four) hours as needed.      Historical Provider, MD  losartan (COZAAR) 100 MG tablet Take 100 mg by mouth daily.      Historical Provider, MD  ranitidine (ZANTAC) 150 MG capsule Take 150 mg by mouth 2 (two) times daily.      Historical Provider, MD  solifenacin (VESICARE) 5 MG tablet Take 10 mg by mouth daily.      Historical Provider, MD  SPIRIVA HANDIHALER 18 MCG inhalation capsule INHALE THE CONTENTS OF 1 CAPSULE USING 2 INHALATIONS ONCE DAILY 02/05/11   Tammy S Parrett, NP  SYMBICORT 160-4.5 MCG/ACT inhaler INHALE 2 PUFFS INTO THE LUNGS TWICE A DAY 08/19/12   Tammy S Parrett, NP  telmisartan (MICARDIS) 80 MG tablet Take 1 tablet (80 mg total) by mouth daily. 10/18/10 10/18/11  Tanda Rockers, MD  traZODone (DESYREL) 150 MG tablet Take 150 mg by mouth at bedtime.      Historical Provider, MD   BP 120/64 mmHg  Pulse 102  Temp(Src)   Resp 20  SpO2 96% Temp(Src) 99.5 F (37.5 C)  Physical Exam  Constitutional: She is oriented to person, place, and time. She appears well-developed and well-nourished.  Non-toxic appearance. She appears distressed.  No temperature obtained, nontoxic, working heavily to breath  HENT:  Head:  Normocephalic and atraumatic.  Mouth/Throat: Oropharynx is clear and moist and mucous membranes are normal.  Eyes: Conjunctivae and EOM are normal. Right eye exhibits no discharge. Left eye exhibits no discharge.  Neck: Normal range of motion. Neck supple.  Cardiovascular: Regular rhythm, normal heart sounds and intact distal pulses.  Tachycardia present.  Exam reveals no gallop and no friction rub.   No murmur heard. Mildly tachycardic post-albuterol treatments, reg rhythm, nl s1/s2, no m/r/g, distal pulses intact, no pedal edema  Pulmonary/Chest: She is in respiratory distress (mildly increased WOB). She has no decreased breath sounds. She has wheezes. She has no rhonchi. She has no rales.  Mildly increased WOB, diffuse expiratory wheezing bilaterally, no hypoxia, SpO2 97% on home 2L via [place]  Abdominal: Soft. Normal appearance and bowel sounds are normal. She exhibits no distension. There is no tenderness. There is no rigidity, no rebound, no guarding, no CVA tenderness, no tenderness at McBurney's point and negative Murphy's sign.  Musculoskeletal: Normal range of motion.  MAE x4 Strength and sensation grossly intact Distal pulses intact  No pedal edema, neg homan's bilaterally   Neurological: She is alert and oriented to person, place, and time. She has normal strength. No sensory deficit.  A&O x3, although unable to report time but daughter states this is baseline for pt   Skin: Skin is warm, dry and intact. No rash noted.  Psychiatric: She has a normal mood and affect.  Nursing note and vitals reviewed.   ED Course  Procedures (including critical care time)  CRITICAL CARE Performed by: Corine Shelter   Total critical care time: 30 minutes  Critical care time was exclusive of separately billable procedures and treating other patients.  Critical care was necessary to treat or prevent imminent or life-threatening deterioration.  Critical care was time spent  personally by me on the following activities: development of treatment plan with Beth Meyers and/or surrogate as well as nursing, discussions with consultants, evaluation of Beth Meyers's response to treatment, examination of Beth Meyers, obtaining history from Beth Meyers or surrogate, ordering and performing treatments and interventions, ordering and review of laboratory studies, ordering and review of radiographic studies, pulse oximetry and re-evaluation of Beth Meyers's condition.   Labs Review Labs Reviewed  CBC WITH DIFFERENTIAL/PLATELET - Abnormal; Notable for the following:    Hemoglobin 11.7 (*)    Neutrophils Relative % 86 (*)    Lymphocytes Relative 10 (*)    All other components within normal limits  COMPREHENSIVE METABOLIC PANEL - Abnormal; Notable for the following:    Potassium 3.3 (*)    Chloride 93 (*)    Glucose, Bld 144 (*)    Calcium 8.3 (*)    Albumin 3.4 (*)    Anion gap 18 (*)    All other components within normal limits  URINALYSIS, ROUTINE W REFLEX MICROSCOPIC (NOT AT [facility]) - Abnormal; Notable for the following:    Ketones, ur 40 (*)    All other components within normal limits  BLOOD GAS, ARTERIAL - Abnormal; Notable for the following:    pH, Arterial 7.476 (*)    pCO2 arterial 27.8 (*)    All other components within normal limits  I-STAT CG4 LACTIC ACID, ED - Abnormal; Notable for the following:    Lactic Acid, Venous 2.12 (*)    All other components within normal limits  URINE RAPID DRUG SCREEN, HOSP PERFORMED  ETHANOL  BRAIN NATRIURETIC PEPTIDE  AMMONIA  I-STAT TROPOININ, ED    Imaging Review Dg Chest 2 View  03/28/2015   CLINICAL DATA:  Shortness of breath and chest pain for 3 days. History of hypertension, asthma, COPD, emphysema.  EXAM: CHEST  2 VIEW  COMPARISON:  None.  FINDINGS: Heart size is normal. There is mild perihilar peribronchial thickening. Emphysematous changes are present with crowded markings at the bases. There are no focal consolidations or pleural  effusions. No pulmonary edema.  Numerous old right rib fractures are present. There are osteoporotic type fractures of T6, T8, and T9.  IMPRESSION: 1. Bronchitic changes.  Emphysematous changes. 2.  No focal acute pulmonary abnormality. 3. Osteoporotic type fractures of the thoracic spine. Consider evaluation and treatment for osteoporosis as appropriate.   Electronically Signed   By: Nolon Nations M.D.   On: 03/28/2015 10:50   Ct Angio Chest Pe W/cm &/or Wo Cm  03/28/2015   CLINICAL DATA:  64 year old female with shortness of breath, altered mental status and history of COPD. Beth Meyers is on home oxygen.  EXAM: CT ANGIOGRAPHY CHEST WITH CONTRAST  TECHNIQUE: Multidetector CT imaging of the chest was performed using the  standard protocol during bolus administration of intravenous contrast. Multiplanar CT image reconstructions and MIPs were obtained to evaluate the vascular anatomy.  CONTRAST:  187mL OMNIPAQUE IOHEXOL 350 MG/ML SOLN  COMPARISON:  Chest x-ray obtained earlier today  FINDINGS: Mediastinum: Unremarkable CT appearance of the thyroid gland. No suspicious mediastinal or hilar adenopathy. No soft tissue mediastinal mass. The thoracic esophagus is unremarkable.  Heart/Vascular: Adequate opacification of the pulmonary arteries to the proximal subsegmental level. Evaluation beyond the segmental arteries is limited secondary to significant respiratory motion. No evidence of central pulmonary embolus. The heart is within normal limits for size. There is no pericardial effusion. Evaluation for coronary artery calcium significantly limited secondary to Beth Meyers motion. No evidence of aortic aneurysm. Trace atherosclerotic calcifications present at the origins of the great vessels.  Lungs/Pleura: Mild biapical pleural parenchymal scarring. Moderately advanced predominantly centrilobular pulmonary emphysema. Trace dependent atelectasis. No focal airspace consolidation, evidence of pulmonary edema or suspicious  nodule or mass. Evaluation for small nodules is limited by respiratory motion.  Bones/Soft Tissues: Multiple remote healed right-sided rib fractures. No acute fracture or aggressive appearing lytic or blastic osseous lesion.  Upper Abdomen: Visualized upper abdominal organs are unremarkable.  Review of the MIP images confirms the above findings.  IMPRESSION: 1. Negative for evidence of acute central pulmonary embolus, pneumonia or other acute cardiopulmonary process. 2. Moderate image degradation secondary to significant respiratory and Beth Meyers related motion artifact. 3. Moderately advanced centrilobular emphysema. 4. Multiple remote right-sided rib fractures noted incidentally.   Electronically Signed   By: Jacqulynn Cadet M.D.   On: 03/28/2015 13:50   I have personally reviewed and evaluated these images and lab results as part of my medical decision-making.   EKG Interpretation   Date/Time:  Sunday March 28 2015 10:07:12 EDT Ventricular Rate:  102 PR Interval:  125 QRS Duration: 116 QT Interval:  364 QTC Calculation: 474 R Axis:   -45 Text Interpretation:  Sinus tachycardia LAD, consider left anterior  fascicular block Baseline wander in lead(s) II III aVR aVF V3 No old  tracing to compare Confirmed by Debby Freiberg [phone]) on 03/28/2015  10:11:17 AM      MDM   Final diagnoses:  SOB (shortness of breath)  Elevated lactic acid level  Wheezing  Respiratory distress    64 y.o. female here with SOB x several days. Definite increased WOB. Poorly compliant on inhalers, and daughter reports mother drinks heavily. Daughter states she's at her baseline for when she's drinking. On exam, mildly tachycardic but this is after 2 duonebs en route, some mild expiratory wheezing diffusely, no temp recorded but will get rectal temp. SpO2 96% on her home oxygen level of 2L via [place]. Will get labs, trop, BNP, ABG, EtOH, UDS, lactic, and ammonia level. Will get CXR and EKG. Will give another neb tx,  xopenex and atrovent. Pt already received solumedrol. Will reassess shortly. Will monitor closely for changes, given that she is working hard to breath.   12:37 PM Temp 99.64F oral. EKG with sinus tachy and LAFB, no old EKGs for comparison. Trop neg. Lactic acid mildly elevated at 2.12, getting fluids. Lung sounds improved after nebs, but pt still with some increased WOB, will give face mask with oxygen and get chest CT to eval for PE. U/A clear, UDS clear. ABG with mildly elevated pH at 7.476 with pCO2 27.8. EtOH neg. BNP WNL. CBC w/diff with baseline anemia. CMP with mildly low K likely from nebulizer treatments. Anion gap 18 likely from lactic acid  level. CXR with no acute findings, chronic bronchitic changes. Pt with some increased confusion per nursing, taking off leads. Will give some ativan as well as helping with face mask oxygen. Will reassess shortly.   2:02 PM CT neg for PE or acute pulm process, advanced emphysema. Pt still very agitated and still having increased WOB. Will proceed with admission.   2:13 PM Dr. Charlies Silvers would like for me to call pulmonology first, and then page her back.  2:26 PM Dr. Nelda Marseille of [facility] returning page, states they will see pt in consult if need be but feels triad can admit. Will re-page Dr. Charlies Silvers.  2:29 PM Dr. Charlies Silvers returning page, will admit. Please see her notes for further documentation of care.   BP 133/66 mmHg  Pulse 114  Temp(Src) 99.5 F (37.5 C)  Resp 24  SpO2 96%  Meds ordered this encounter  Medications  . levalbuterol (XOPENEX) nebulizer solution 0.63 mg    Sig:   . ipratropium (ATROVENT) nebulizer solution 0.5 mg    Sig:   . ondansetron (ZOFRAN) injection 4 mg    Sig:   . 0.9 %  sodium chloride infusion    Sig:   . LORazepam (ATIVAN) injection 0.5 mg    Sig:   . iohexol (OMNIPAQUE) 350 MG/ML injection 100 mL    Sig:      Yunis Voorheis Camprubi-Soms, PA-C 03/28/15 1430  Daleen Bo, MD 03/29/15 1645

## 2015-03-28 NOTE — ED Provider Notes (Signed)
Face-to-face evaluation   History: Here for evaluation of shortness of breath, not improving with home treatments of oxygen and nebulizers. She was treated by EMS, with 2 nebulizers and Solu-Medrol, family reports that she appears more comfortable at this time. She states that she does not smoke cigarettes.  Physical exam: Alert, calm, lying semi-Fowler's position and not in severe respiratory distress. She does reveal some increased work of breathing with pursed lips, and exaggerated respiratory muscle effort.  14:50- patient has repeatedly agitated, and actually worse now after receiving IV Ativan, and having had her CT scan. Family members not seen her, get like this previously. Her alcohol level is negative and she apparently drinks a lot. At this time, she is alert, is able to follow some commands, but begins to resist efforts to help, including keeping face mask on, and stay in bed. He has to be repeatedly asked to lie down, and not pull things off. Family members are in the room, and were updated on the process in the suspected problem. IV Haldol ordered for delirium.  Medications  ipratropium-albuterol (DUONEB) 0.5-2.5 (3) MG/3ML nebulizer solution 3 mL (3 mLs Nebulization Given 03/28/15 1445)  ipratropium-albuterol (DUONEB) 0.5-2.5 (3) MG/3ML nebulizer solution 3 mL (not administered)  methylPREDNISolone sodium succinate (SOLU-MEDROL) 125 mg/2 mL injection 60 mg (60 mg Intravenous Given 03/28/15 1444)  levalbuterol (XOPENEX) nebulizer solution 0.63 mg (0.63 mg Nebulization Given 03/28/15 1045)  ipratropium (ATROVENT) nebulizer solution 0.5 mg (0.5 mg Nebulization Given 03/28/15 1045)  ondansetron (ZOFRAN) injection 4 mg (4 mg Intravenous Given 03/28/15 1222)  0.9 %  sodium chloride infusion ( Intravenous New Bag/Given 03/28/15 1230)  LORazepam (ATIVAN) injection 0.5 mg (0.5 mg Intravenous Given 03/28/15 1248)  iohexol (OMNIPAQUE) 350 MG/ML injection 100 mL (100 mLs Intravenous Contrast Given 03/28/15  1324)  haloperidol lactate (HALDOL) injection 2.5 mg (2.5 mg Intravenous Given 03/28/15 1448)    Patient Vitals for the past 24 hrs:  BP Temp Pulse Resp SpO2  03/28/15 1458 - - 114 - -  03/28/15 1340 133/66 mmHg - 114 24 96 %  03/28/15 1230 (!) 132/103 mmHg - - - -  03/28/15 1113 - 99.5 F (37.5 C) - - -  03/28/15 1100 131/66 mmHg - 111 - 93 %  03/28/15 1025 - - - - 97 %  03/28/15 1007 120/64 mmHg - 102 20 96 %  03/28/15 1002 - - - - 97 %    3:12 PM Reevaluation with update and discussion. After initial assessment and treatment, an updated evaluation reveals she continues to be agitated, redirectable, but requires one-on-one care. Will repeat Haldol dosing. Ewell Benassi L   15:40- she continues to be moderately agitated, but has improved somewhat and is somewhat more sedated now, after the Haldol.   Diagnoses: 1. COPD exacerbation 2. Agitated delirium, likely related to alcohol withdrawal 3. Alcoholism  CRITICAL CARE Performed by: Richarda Blade Total critical care time: 40 minutes Critical care time was exclusive of separately billable procedures and treating other patients. Critical care was necessary to treat or prevent imminent or life-threatening deterioration. Critical care was time spent personally by me on the following activities: development of treatment plan with patient and/or surrogate as well as nursing, discussions with consultants, evaluation of patient's response to treatment, examination of patient, obtaining history from patient or surrogate, ordering and performing treatments and interventions, ordering and review of laboratory studies, ordering and review of radiographic studies, pulse oximetry and re-evaluation of patient's condition.  Assessment- COPD exacerbation, with alcoholism, and alcohol  withdrawal symptoms. She appears at least moderately in alcohol withdrawal at this time. Her admitting physician will be contacted to consider changing level of care to  stepdown versus ICU for observation and treatment as needed. If she improves with Haldol, she may not have more aggressive treatment. If not, she will likely need Precedex.  Medical screening examination/treatment/procedure(s) were conducted as a shared visit with non-physician practitioner(s) and myself.  I personally evaluated the patient during the encounter  Daleen Bo, MD 03/28/15 1550

## 2015-03-28 NOTE — ED Notes (Addendum)
Resting quietly with eye closed. Easily arousable. Verbally responsive. Resp even and slightly labored. Sats at 94% with O2 at 3lpm via [place]. ABC's intact. IV saline lock patent and intact. Family at bedside.

## 2015-03-28 NOTE — ED Notes (Signed)
Applied venturi mask at 40% or 8lpm. Pt given water to drink.

## 2015-03-28 NOTE — ED Notes (Signed)
Awake. Verbally responsive. A/O x4 with intermittent confusion. Resp even and labored. No audible adventitious breath sounds noted. ABC's intact. ST on monitor. Pt restless. Family at bedside. IV saline lock patent and intact.

## 2015-03-28 NOTE — ED Notes (Addendum)
Awake. Verbally responsive. A/O x4 with intermittent confusion. Resp even and labored. No audible adventitious breath sounds noted. Pt has increase labored resp at rest and with exertion. ABC's intact. ST on monitor at 122. Pt having increase restlessness. Pt has removed oxygen, EKG leads and other monitor equipment. Pt pleasant and reminded of purpose of equipment. Verbalized understanding. PA and MD made aware with new orders noted.

## 2015-03-28 NOTE — ED Notes (Addendum)
Pt from home via [facility]-Per EMS, pt c/o SOB x3 hours. Pt has hx of COPD and emphysema and is on home O2. EMS reports that pt has wheezing in all fields. Pt received 2 albuterol/ atrovent nebs en route as well as 125 Solumedrol. Pt is A&O x3 but family reports that pt is not at her mental baseline. Pt in NAD

## 2015-03-28 NOTE — ED Notes (Signed)
Patient transported to CT 

## 2015-03-29 ENCOUNTER — Inpatient Hospital Stay (HOSPITAL_COMMUNITY): Payer: Medicare Other

## 2015-03-29 LAB — COMPREHENSIVE METABOLIC PANEL
ALBUMIN: 3.1 g/dL — AB (ref 3.5–5.0)
ALK PHOS: 63 U/L (ref 38–126)
ALT: 21 U/L (ref 14–54)
ANION GAP: 11 (ref 5–15)
AST: 48 U/L — ABNORMAL HIGH (ref 15–41)
BILIRUBIN TOTAL: 0.5 mg/dL (ref 0.3–1.2)
BUN: 18 mg/dL (ref 6–20)
CALCIUM: 8.1 mg/dL — AB (ref 8.9–10.3)
CO2: 27 mmol/L (ref 22–32)
CREATININE: 0.76 mg/dL (ref 0.44–1.00)
Chloride: 98 mmol/L — ABNORMAL LOW (ref 101–111)
GFR calc Af Amer: >60 mL/min (ref >60)
GFR calc non Af Amer: >60 mL/min (ref >60)
GLUCOSE: 147 mg/dL — AB (ref 65–99)
Potassium: 3.4 mmol/L — ABNORMAL LOW (ref 3.5–5.1)
SODIUM: 136 mmol/L (ref 135–145)
TOTAL PROTEIN: 6.4 g/dL — AB (ref 6.5–8.1)

## 2015-03-29 LAB — CBC
HCT: 34.6 % — ABNORMAL LOW (ref 36.0–46.0)
Hemoglobin: 11.2 g/dL — ABNORMAL LOW (ref 12.0–15.0)
MCH: 29.2 pg (ref 26.0–34.0)
MCHC: 32.4 g/dL (ref 30.0–36.0)
MCV: 90.3 fL (ref 78.0–100.0)
PLATELETS: 281 10*3/uL (ref 150–400)
RBC: 3.83 MIL/uL — ABNORMAL LOW (ref 3.87–5.11)
RDW: 14.8 % (ref 11.5–15.5)
WBC: 8.2 10*3/uL (ref 4.0–10.5)

## 2015-03-29 LAB — GLUCOSE, CAPILLARY: GLUCOSE-CAPILLARY: 124 mg/dL — AB (ref 65–99)

## 2015-03-29 MED ORDER — IPRATROPIUM-ALBUTEROL 0.5-2.5 (3) MG/3ML IN SOLN
3.0000 mL | Freq: Three times a day (TID) | RESPIRATORY_TRACT | Status: DC
  Administered 2015-03-30 – 2015-04-03 (×13): 3 mL via RESPIRATORY_TRACT
  Filled 2015-03-29 (×13): qty 3

## 2015-03-29 MED ORDER — DEXMEDETOMIDINE HCL IN NACL 200 MCG/50ML IV SOLN
0.4000 ug/kg/h | INTRAVENOUS | Status: DC
  Administered 2015-03-29: 0.4 ug/kg/h via INTRAVENOUS
  Administered 2015-03-29: 0.5 ug/kg/h via INTRAVENOUS
  Administered 2015-03-29 (×2): 0.4 ug/kg/h via INTRAVENOUS
  Administered 2015-03-30: 0.5 ug/kg/h via INTRAVENOUS
  Administered 2015-03-30: 0.892 ug/kg/h via INTRAVENOUS
  Administered 2015-03-30: 0.8 ug/kg/h via INTRAVENOUS
  Administered 2015-03-30: 0.892 ug/kg/h via INTRAVENOUS
  Administered 2015-03-30: 0.5 ug/kg/h via INTRAVENOUS
  Administered 2015-03-30: 0.9 ug/kg/h via INTRAVENOUS
  Administered 2015-03-31: 0.892 ug/kg/h via INTRAVENOUS
  Administered 2015-03-31 (×4): 1.2 ug/kg/h via INTRAVENOUS
  Administered 2015-03-31: 0.892 ug/kg/h via INTRAVENOUS
  Administered 2015-04-01: 1.2 ug/kg/h via INTRAVENOUS
  Administered 2015-04-01: 0.9 ug/kg/h via INTRAVENOUS
  Administered 2015-04-01 (×5): 1.2 ug/kg/h via INTRAVENOUS
  Administered 2015-04-01: 0.8 ug/kg/h via INTRAVENOUS
  Administered 2015-04-01: 1 ug/kg/h via INTRAVENOUS
  Administered 2015-04-02 (×2): 1.2 ug/kg/h via INTRAVENOUS
  Administered 2015-04-02: 1 ug/kg/h via INTRAVENOUS
  Administered 2015-04-02: 0.8 ug/kg/h via INTRAVENOUS
  Administered 2015-04-02: 0.5 ug/kg/h via INTRAVENOUS
  Administered 2015-04-02: 1.2 ug/kg/h via INTRAVENOUS
  Filled 2015-03-29 (×2): qty 50
  Filled 2015-03-29: qty 100
  Filled 2015-03-29: qty 50
  Filled 2015-03-29: qty 100
  Filled 2015-03-29 (×11): qty 50
  Filled 2015-03-29: qty 100
  Filled 2015-03-29 (×15): qty 50

## 2015-03-29 MED ORDER — DEXMEDETOMIDINE HCL IN NACL 200 MCG/50ML IV SOLN: 0.2000 ug/kg/h | INTRAVENOUS | Status: DC

## 2015-03-29 MED ORDER — FOLIC ACID 5 MG/ML IJ SOLN
1.0000 mg | Freq: Every day | INTRAMUSCULAR | Status: DC
  Administered 2015-03-29 – 2015-04-06 (×9): 1 mg via INTRAVENOUS
  Filled 2015-03-29 (×10): qty 0.2

## 2015-03-29 MED ORDER — POTASSIUM CHLORIDE 10 MEQ/100ML IV SOLN
10.0000 meq | INTRAVENOUS | Status: AC
  Administered 2015-03-29 (×3): 10 meq via INTRAVENOUS
  Filled 2015-03-29 (×3): qty 100

## 2015-03-29 MED ORDER — IPRATROPIUM-ALBUTEROL 0.5-2.5 (3) MG/3ML IN SOLN
3.0000 mL | RESPIRATORY_TRACT | Status: DC
  Administered 2015-03-29 (×3): 3 mL via RESPIRATORY_TRACT
  Filled 2015-03-29 (×3): qty 3

## 2015-03-29 MED ORDER — CHLORHEXIDINE GLUCONATE 0.12 % MT SOLN
15.0000 mL | Freq: Two times a day (BID) | OROMUCOSAL | Status: DC
  Administered 2015-03-29 – 2015-04-03 (×9): 15 mL via OROMUCOSAL
  Filled 2015-03-29 (×6): qty 15

## 2015-03-29 MED ORDER — CETYLPYRIDINIUM CHLORIDE 0.05 % MT LIQD
7.0000 mL | Freq: Two times a day (BID) | OROMUCOSAL | Status: DC
  Administered 2015-03-29 – 2015-04-03 (×10): 7 mL via OROMUCOSAL

## 2015-03-29 MED ORDER — HYDRALAZINE HCL 20 MG/ML IJ SOLN
10.0000 mg | Freq: Three times a day (TID) | INTRAMUSCULAR | Status: DC
  Administered 2015-03-31 – 2015-04-03 (×9): 10 mg via INTRAVENOUS
  Filled 2015-03-29 (×11): qty 1

## 2015-03-29 MED ORDER — FUROSEMIDE 10 MG/ML IJ SOLN
20.0000 mg | Freq: Two times a day (BID) | INTRAMUSCULAR | Status: DC
  Administered 2015-03-29: 20 mg via INTRAVENOUS
  Filled 2015-03-29: qty 2

## 2015-03-29 MED ORDER — PNEUMOCOCCAL VAC POLYVALENT 25 MCG/0.5ML IJ INJ
0.5000 mL | INJECTION | INTRAMUSCULAR | Status: AC
  Administered 2015-03-30: 0.5 mL via INTRAMUSCULAR
  Filled 2015-03-29 (×2): qty 0.5

## 2015-03-29 MED ORDER — CETYLPYRIDINIUM CHLORIDE 0.05 % MT LIQD
7.0000 mL | Freq: Two times a day (BID) | OROMUCOSAL | Status: DC
  Administered 2015-03-29 – 2015-04-02 (×9): 7 mL via OROMUCOSAL

## 2015-03-29 MED ORDER — PANTOPRAZOLE SODIUM 40 MG IV SOLR
40.0000 mg | INTRAVENOUS | Status: DC
  Administered 2015-03-29 – 2015-04-06 (×9): 40 mg via INTRAVENOUS
  Filled 2015-03-29 (×9): qty 40

## 2015-03-29 NOTE — Progress Notes (Signed)
Both sets of diamond earrings taken out of patients ears and given to husband at bedside.

## 2015-03-29 NOTE — Progress Notes (Signed)
Charted by Macario Golds per telephone conversation with Durwin Nora

## 2015-03-29 NOTE — Plan of Care (Signed)
Problem: Phase I Progression Outcomes Goal: OOB as tolerated unless otherwise ordered Outcome: Not Progressing Bedrest until through detox    Goal: Voiding-avoid urinary catheter unless indicated Outcome: Not Progressing Foley d/t urinary retention and unstable critical patient

## 2015-03-29 NOTE — Progress Notes (Signed)
I spoke with the patient's daughter at the bedside. She is very frustrated that we do not know what's going on at this point. She explained to me that her mother was drinking alcohol before but quit cold [place] recently. She then was fine and started drinking again about 4 beers a day. Daughter says to me that she was told in ER that urine drug screen and alcohol level were within normal limits and she does not understand how can she be in withdrawal if those tests are within normal limits. I explained to her that when alcohol level is within normal limits that's when we worry the most about withdrawal. I also mentioned to her that there may be certain drugs which are not routinely tested on urine drug screen. She got very offended with this and she said "my mother is not using street drugs and let's not have this brought up again. All I needed to do is figure out was going on with her." I explained to her that we are doing what we can and at this time it is not clear why she altered.  I will place order for Precedex drip. We will make an attempt to obtain CT head or MRI of the brain when patient is more calm.  Leisa Lenz [facility] [phone]

## 2015-03-29 NOTE — Progress Notes (Signed)
Received telephone order from Dr. Charlies Silvers to continue non-violent bilateral wrist restraint order for another 24 hours.

## 2015-03-29 NOTE — Progress Notes (Addendum)
Patient ID: Beth Meyers, female   DOB: 12-Oct-1950, 64 y.o.   MRN: [id number] [facility] PROGRESS NOTE  Beth Meyers UTM:[id number] DOB: January 04, 1951 DOA: 03/28/2015 PCP: Lynne Logan, MD  Brief narrative:    64 year old female with past medical history of COPD, anxiety and depression, hypertension who presented to [facility] ED with worsening shortness of breath over past few days prior to this admission. She tried home inhaler and nebulizer treatment but without significant symptomatic relief.   In ED, her BP was 91/48 but has improved to 133/66 with IV fluids. Oxygen saturation was 93% on [place] oxygen support. Blood work was notable for hemoglobin of 11.7, potassium 3.3 otherwise unremarkable. UDS and alcohol level were WNL. CXR showed bronchitic changes. CT angio chest ruled out pulmonary embolism. She did have remote rib fractures identified on CT scan. She continued to feel restless and at moments agitated in ED. She was placed on CIWA protocol for possible alcohol withdrawal.   This am, patient more confused this am for which reason we will place her on precedex drip. I spoke with the patient's daughter who seemed very frustrated with the care and that we do not know exactly what is going on. My conversation with her is documented in separate note.   Assessment/Plan:    Principal Problem:  Acute encephalopathy / Alcohol withdrawal - Unclear etiology, suspect alcohol withdrawal or ingestion of ?other substances than what's tested on drug screen - Pt's daughter adamant that patient does not use street drug and has been drinking not more than 4 beers a day - Placed on CIWA protocol on admission but due to more confusion and agitation this am we have placed her on precedex - Will attempt CT head once she is calmer - UDS and alcohol were WNL - Monitor in SDU - Continue supportive care with IV fluids  Active Problems:  COPD exacerbation - Placed on duoneb every 4 hours as needed for  shortness of breath or wheezing and every 4 hours scheduled - She has received solumedrol on admission, 125 mg IV In ED and then 60 mg IV Q 12 hours. Stop today as it may potentially worsen confusion  - Continue oxygen support via [place] to keep O2 sats above 90%   Essential hypertension - Continue lasix, stop losartan as pt too confused to take meds - Add hydralazine IV for better blood pressure control since BP this am is 145/107   Hypokalemia - Due to lasix - Supplemented   Dyslipidemia - Continue fenofibrate    Anxiety and depression - On Precedex   DVT prophylaxis:  - SCD's bilaterally    Code Status: Full.  Family Communication:  plan of care discussed with the patient's daughter  Disposition Plan: requires precedex drip this am. Remain in SDU.  IV access:  Peripheral IV  Procedures and diagnostic studies:    Dg Chest 2 View 03/28/2015 1. Bronchitic changes.  Emphysematous changes. 2.  No focal acute pulmonary abnormality. 3. Osteoporotic type fractures of the thoracic spine. Consider evaluation and treatment for osteoporosis as appropriate.   Electronically Signed   By: Nolon Nations M.D.   On: 03/28/2015 10:50   Ct Angio Chest Pe W/cm &/or Wo Cm 03/28/2015    1. Negative for evidence of acute central pulmonary embolus, pneumonia or other acute cardiopulmonary process. 2. Moderate image degradation secondary to significant respiratory and patient related motion artifact. 3. Moderately advanced centrilobular emphysema. 4. Multiple remote right-sided rib fractures noted incidentally.  Electronically Signed   By: Jacqulynn Cadet M.D.   On: 03/28/2015 13:50    Medical Consultants:  None   Other Consultants:  None   IAnti-Infectives:   None    Leisa Lenz, MD  [facility] Pager [phone]  Time spent in minutes: 25 minutes  If 7PM-7AM, please contact night-coverage [email or web address] Password [email or web address] 03/29/2015, 10:56 AM   LOS: 1 day     HPI/Subjective: No acute overnight events. Patient is restless.   Objective: Filed Vitals:   03/29/15 0814 03/29/15 0828 03/29/15 1019 03/29/15 1030  BP: 145/107  98/69 123/63  Pulse: 128 100 108 106  Temp:      TempSrc:      Resp: 22 12 14 13   Height:      Weight:      SpO2: 93% 94% 96% 99%    Intake/Output Summary (Last 24 hours) at 03/29/15 1056 Last data filed at 03/29/15 0600  Gross per 24 hour  Intake     96 ml  Output    700 ml  Net   -604 ml    Exam:   General:  Pt is appears restless, confused   Cardiovascular: Regular rate and rhythm, S1/S2, no murmurs  Respiratory: wheezing more on left side and clear to auscultation on the right   Abdomen: Soft, non tender, non distended, bowel sounds present  Extremities: No edema, pulses DP and PT palpable bilaterally  Neuro: Grossly nonfocal  Data Reviewed: Basic Metabolic Panel:  Recent Labs Lab 03/28/15 1055 03/28/15 1832 03/29/15 0342  NA 135 136 136  K 3.3* 2.9* 3.4*  CL 93* 97* 98*  CO2 24 25 27   GLUCOSE 144* 153* 147*  BUN 13 15 18   CREATININE 0.83 0.84 0.76  CALCIUM 8.3* 7.7* 8.1*  MG  --  2.1  --   PHOS  --  3.5  --    Liver Function Tests:  Recent Labs Lab 03/28/15 1055 03/28/15 1832 03/29/15 0342  AST 36 44* 48*  ALT 16 18 21   ALKPHOS 70 60 63  BILITOT 0.5 0.8 0.5  PROT 7.1 6.1* 6.4*  ALBUMIN 3.4* 2.9* 3.1*   No results for input(s): LIPASE, AMYLASE in the last 168 hours.  Recent Labs Lab 03/28/15 1056 03/28/15 1830  AMMONIA SAMPLE TOO OLD, NOTIFIED AMBER AT 1600 03/28/15 HOOKEB 12   CBC:  Recent Labs Lab 03/28/15 1055 03/28/15 1832 03/29/15 0342  WBC 6.8 5.9 8.2  NEUTROABS 5.8 5.5  --   HGB 11.7* 10.4* 11.2*  HCT 36.9 31.8* 34.6*  MCV 90.4 89.8 90.3  PLT 357 280 281   Cardiac Enzymes: No results for input(s): CKTOTAL, CKMB, CKMBINDEX, TROPONINI in the last 168 hours. BNP: Invalid input(s): POCBNP CBG:  Recent Labs Lab 03/29/15 0730  GLUCAP 124*     Recent Results (from the past 240 hour(s))  MRSA PCR Screening     Status: None   Collection Time: 03/28/15  4:00 PM  Result Value Ref Range Status   MRSA by PCR NEGATIVE NEGATIVE Final     Scheduled Meds: . antiseptic oral rinse  7 mL Mouth Rinse BID  . folic acid  1 mg Intravenous Daily  . furosemide  20 mg Intravenous BID  . hydrALAZINE  10 mg Intravenous 3 times per day  . ipratropium-albuterol  3 mL Nebulization Q4H  . multivitamin with minerals  1 tablet Oral Daily  . pantoprazole (PROTONIX) IV  40 mg Intravenous Q24H  . sodium chloride  3 mL Intravenous Q12H  .  thiamine  100 mg Intravenous Daily   Continuous Infusions: . sodium chloride Stopped (03/28/15 2015)  . dexmedetomidine 0.4 mcg/kg/hr (03/29/15 1010)

## 2015-03-29 NOTE — Progress Notes (Signed)
Took patient down for MRI and patient was unable to lay still and keep arms away from face after restraints were removed. Restraints had to be removed for MRI as well as temp foley. Dr. Charlies Silvers notified. Will continue to monitor on precedex gtt.

## 2015-03-30 DIAGNOSIS — R4182 Altered mental status, unspecified: Secondary | ICD-10-CM

## 2015-03-30 DIAGNOSIS — F10231 Alcohol dependence with withdrawal delirium: Secondary | ICD-10-CM | POA: Diagnosis not present

## 2015-03-30 DIAGNOSIS — E785 Hyperlipidemia, unspecified: Secondary | ICD-10-CM

## 2015-03-30 LAB — MAGNESIUM: MAGNESIUM: 2.2 mg/dL (ref 1.7–2.4)

## 2015-03-30 LAB — BASIC METABOLIC PANEL
Anion gap: 8 (ref 5–15)
BUN: 27 mg/dL — ABNORMAL HIGH (ref 6–20)
CALCIUM: 8.4 mg/dL — AB (ref 8.9–10.3)
CO2: 30 mmol/L (ref 22–32)
CREATININE: 0.79 mg/dL (ref 0.44–1.00)
Chloride: 104 mmol/L (ref 101–111)
GFR calc non Af Amer: >60 mL/min (ref >60)
Glucose, Bld: 108 mg/dL — ABNORMAL HIGH (ref 65–99)
Potassium: 3.1 mmol/L — ABNORMAL LOW (ref 3.5–5.1)
SODIUM: 142 mmol/L (ref 135–145)

## 2015-03-30 LAB — CBC
HCT: 30.3 % — ABNORMAL LOW (ref 36.0–46.0)
Hemoglobin: 9.8 g/dL — ABNORMAL LOW (ref 12.0–15.0)
MCH: 29.6 pg (ref 26.0–34.0)
MCHC: 32.3 g/dL (ref 30.0–36.0)
MCV: 91.5 fL (ref 78.0–100.0)
PLATELETS: 238 10*3/uL (ref 150–400)
RBC: 3.31 MIL/uL — ABNORMAL LOW (ref 3.87–5.11)
RDW: 15.3 % (ref 11.5–15.5)
WBC: 6.3 10*3/uL (ref 4.0–10.5)

## 2015-03-30 LAB — VITAMIN B12: VITAMIN B 12: 594 pg/mL (ref 180–914)

## 2015-03-30 LAB — GLUCOSE, CAPILLARY: GLUCOSE-CAPILLARY: 99 mg/dL (ref 65–99)

## 2015-03-30 MED ORDER — THIAMINE HCL 100 MG/ML IJ SOLN
500.0000 mg | Freq: Three times a day (TID) | INTRAMUSCULAR | Status: AC
  Administered 2015-03-30 – 2015-04-01 (×8): 500 mg via INTRAVENOUS
  Filled 2015-03-30 (×9): qty 6

## 2015-03-30 MED ORDER — FUROSEMIDE 10 MG/ML IJ SOLN
40.0000 mg | Freq: Once | INTRAMUSCULAR | Status: AC
  Administered 2015-03-30: 40 mg via INTRAVENOUS
  Filled 2015-03-30: qty 4

## 2015-03-30 MED ORDER — POTASSIUM CHLORIDE 10 MEQ/100ML IV SOLN
10.0000 meq | INTRAVENOUS | Status: AC
  Administered 2015-03-30 (×3): 10 meq via INTRAVENOUS
  Filled 2015-03-30 (×3): qty 100

## 2015-03-30 MED ORDER — HALOPERIDOL LACTATE 5 MG/ML IJ SOLN
1.0000 mg | Freq: Four times a day (QID) | INTRAMUSCULAR | Status: DC | PRN
Start: 2015-03-30 — End: 2015-04-03
  Administered 2015-03-30 – 2015-03-31 (×4): 1 mg via INTRAVENOUS
  Administered 2015-04-01: 5 mg via INTRAVENOUS
  Administered 2015-04-01: 1 mg via INTRAVENOUS
  Filled 2015-03-30 (×7): qty 1

## 2015-03-30 NOTE — Progress Notes (Signed)
Pt restless with BP in 90's. Unable to sit still for 30 minutes.

## 2015-03-30 NOTE — Progress Notes (Signed)
Pt severely agitated. Precedex turned up to 0.6 mcg/kg/hr.

## 2015-03-30 NOTE — Progress Notes (Signed)
Pt calm and resting. MRI notified-stated that it would be a couple hours possibly until she could come down. Will continue to monitor.
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
## 2015-03-30 NOTE — Progress Notes (Signed)
Patient is combative and yelling out in halls. Will increase precedex gtt. MRI notified.

## 2015-03-30 NOTE — Consult Note (Signed)
Neurology Consultation Reason for Consult: Altered mental status Referring Physician: Leisa Lenz  CC: Altered mental status  History is obtained from: Patient, daughter, chart review  HPI: Scottlynn A Zwiebel is a 64 y.o. female who was admitted with shortness of breath on 9/4. She apparently became short of breath the past several days. She does have a history of chronic drinking, apparently drinking approximately 4 beers per day. He can the day of admission, she was having some agitation but this worsened the day following admission she was intensely agitated. She was transferred to the ICU and started on Precedex drip.  She was clear headed on coming to the hospital, but became agitated in the emergency room   ROS:  Unable to obtain due to altered mental status.   Past Medical History  Diagnosis Date  . COPD (chronic obstructive pulmonary disease)   . Hypertension   . Lumbago   . Depression   . Anxiety   . GERD (gastroesophageal reflux disease)      Family History  Problem Relation Age of Onset  . Emphysema Brother   . Asthma Brother   . Heart disease Father   . Heart disease Brother     Social History:  reports that she quit smoking about 22 years ago. Her smoking use included Cigarettes. She has a 270 pack-year smoking history. She does not have any smokeless tobacco history on file. She reports that she drinks alcohol. She reports that she does not use illicit drugs.   Exam: Current vital signs: BP 128/68 mmHg  Pulse 66  Temp(Src) 97.9 F (36.6 C) (Core (Comment))  Resp 17  Ht 5\' 3"  (1.6 m)  Wt 80.7 kg (177 lb 14.6 oz)  BMI 31.52 kg/m2  SpO2 100% Vital signs in last 24 hours: Temp:  [97.9 F (36.6 C)-99.3 F (37.4 C)] 97.9 F (36.6 C) (09/06 0800) Pulse Rate:  [62-127] 66 (09/06 0800) Resp:  [13-22] 17 (09/06 1018) BP: (81-194)/(41-162) 128/68 mmHg (09/06 1018) SpO2:  [91 %-100 %] 100 % (09/06 0800)   Physical Exam  Constitutional: Does not appear  malnourished.  Psych: Agitated Eyes: No scleral injection HENT: No OP obstrucion Head: Normocephalic.  Cardiovascular: Normal rate and regular rhythm.  Respiratory: Effort normal and breath sounds normal to anterior ascultation GI: Soft.  No distension. There is no tenderness.  Skin: WDI  Neuro: Mental Status: Patient is awake, she will not answer all orientation questions.  No signs of aphasia or neglect Cranial Nerves: II: Able to count fingers in all visual fields.  Pupils are equal, round, and reactive to light.   III,IV, VI: EOMI without ptosis or diploplia.  V: Facial sensation is symmetric to temperature VII: Facial movement is symmetric.  VIII: hearing is intact to voice X: Uvula elevates symmetrically XI: Shoulder shrug is symmetric. XII: tongue is midline without atrophy or fasciculations.  Motor: Tone is normal. Bulk is normal. She moves all extremities well, but does not cooperate with formal strength testing.  Sensory: Sensation is symmetric to light touch in the arms and legs. Deep Tendon Reflexes: 2+ and symmetric in the patellae. Resists checking in the biceps.  Plantars: Toes are downgoing bilaterally.  Cerebellar: Will not perform   I have reviewed labs in epic and the results pertinent to this consultation are: Hypokalemia TSH 0.335  Impression: 64 yo F with what is likely a multifactorial delirium including possible EtOH withdrawal, benzodiazepine use, hypoxia, steroid use, ICU delirium. The fact that she is improving per nursing  is reassuring. I have a low suspicion of Wernicke's encephalopathy but it is reasonable to continue to treat with high dose thiamine(I will increase this to wernicke's treatment level, 500mg  TID x 3 days) given that this is a low risk intervention.   Recommendations: 1) Thiamine 500mg  TID x 3 days 2) check B12 3) will follow up MRI 4) Agree with precedex.    Roland Rack, MD [facility] [phone]  If 7pm- 7am, please page neurology on call as listed in [place].

## 2015-03-30 NOTE — Care Management Note (Signed)
Case Management Note  Patient Details  Name: Beth Meyers MRN: [id number] Date of Birth: 1951/03/05  Subjective/Objective:          etoh withdrawal          Action/Plan: Date:  Sept.6, 2016 U.R. performed for needs and level of care. Will continue to follow for Case Management needs.  Velva Harman, RN, BSN, [place]   [phone]  Expected Discharge Date:                  Expected Discharge Plan:  Home/Self Care  In-House Referral:  Clinical Social Work  Discharge planning Services  CM Consult  Post Acute Care Choice:  NA Choice offered to:  NA  DME Arranged:    DME Agency:     HH Arranged:    [place] Agency:     Status of Service:  Completed, signed off  Medicare Important Message Given:    Date Medicare IM Given:    Medicare IM give by:    Date Additional Medicare IM Given:    Additional Medicare Important Message give by:     If discussed at [place] of Stay Meetings, dates discussed:    Additional Comments:  Leeroy Cha, RN 03/30/2015, 9:44 AM
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
## 2015-03-30 NOTE — Progress Notes (Signed)
Patient ID: Beth Meyers, female   DOB: 07/18/1951, 64 y.o.   MRN: [id number] [facility] PROGRESS NOTE  Katiria Calame Nordquist [id number]:[id number] DOB: Mar 06, 1951 DOA: 03/28/2015 PCP: Lynne Logan, MD  Brief narrative:    64 year old female with past medical history of COPD, anxiety and depression, hypertension who presented to [facility] ED with worsening shortness of breath over past few days prior to this admission. She tried home inhaler and nebulizer treatment but without significant symptomatic relief.   In ED, her BP was 91/48 but has improved to 133/66 with IV fluids. Oxygen saturation was 93% on [place] oxygen support. Blood work was notable for hemoglobin of 11.7, potassium 3.3 otherwise unremarkable. UDS and alcohol level were WNL. CXR showed bronchitic changes. CT angio chest ruled out pulmonary embolism. She did have remote rib fractures identified on CT scan. She continued to feel restless and at moments agitated in ED. She was placed on CIWA protocol for possible alcohol withdrawal. She however required precedex drip (started 03/29/2015) because of worsening agitation and restlessness. PCCM ok that [facility] manages precedex with pharmacy help.   Barrier to discharge: I spoke with the patient's daughter and patient's husband at the bedside extensively. They wanted to make sure that we documented that patient has no history of drug abuse or using any type of street drugs or narcotics. She does have long-standing history of alcohol use. She quit "cold [place]" about one month prior to this admission but went back and started drinking about 4 beers a day. Patient's daughter also pointed out that patient did have some mental status changes on and off at home with episodes of grunting and talking to herself but she was mostly oriented and had no episodes of confusion. Her concern was that patient got more altered after receiving Ativan in ED. I spoke with the nurse at the bedside and instructed to use Haldol instead  of Ativan if nneded for confusion, halluciantion or agitation. Consulted psychiatry and neurology.   Assessment/Plan:    Principal Problem:  Acute encephalopathy / Alcohol withdrawal - Unclear etiology, suspect alcohol withdrawal, Wernicke's encephalopathy or other possibilities such as stroke, brain mass - CT head showed no acute intracranial findings. - UDS and alcohol were WNL - Attempted MRI brain  03/29/2015 but patient was too restless to have MRI completed. - Precedex drip started 03/29/2015. We will attempt MRI of the brain this morning because patient is calm , sleeping - Continue to monitor in step down unit. - We stopped ativan per patient's daughter request as she was  Concerned that Ativan made patient's mental status worse. Order placed for Haldol 1 milligrams every 6 hours as needed for agitation, confusion, hallucinations or restlessness - Consulted neurology and psychiatry.  Active Problems:  COPD exacerbation - Stable respiratory status. Patient has received Solu-Medrol in ED 125 mg IV.  She was then placed on Solu-Medrol 60 mg IV every 12 hours. - Because of the concern that steroids may have worsened mental status we stopped Solu-Medrol 03/29/2015. - Continue DuoNeb 3 times daily along with as needed nebulizers for shortness of breath or wheezing - Continue oxygen support via nasal cannula to keep oxygen saturation above 90%.   Essential hypertension - Continue lasix, given IV due to altered mental status.  -  Patient is on Lasix at home. - Blood pressure this morning is 128/68 so Lasix ordered as a one-time dose this morning.  Please evaluate in the morning if she can get another Lasix dose. - We  will continue to monitor blood pressure.    Hypokalemia - Due to lasix - Supplemented   Dyslipidemia - Fenofibrate on hold due to altered mental status      Normocytic anemia -  Hemoglobin stable at 9.8 -  No current indications for transfusion   Anxiety and  depression - On Precedex drip    DVT prophylaxis:  - SCD's bilaterally    Code Status: Full.  Family Communication:  plan of care discussed with the patient's daughter and patient's husband at the bedside  Disposition Plan: requires precedex drip. Remain in SDU.  IV access:  Peripheral IV  Procedures and diagnostic studies:    Dg Chest 2 View 03/28/2015 1. Bronchitic changes.  Emphysematous changes. 2.  No focal acute pulmonary abnormality. 3. Osteoporotic type fractures of the thoracic spine. Consider evaluation and treatment for osteoporosis as appropriate.   Electronically Signed   By: Nolon Nations M.D.   On: 03/28/2015 10:50   Ct Angio Chest Pe W/cm &/or Wo Cm 03/28/2015    1. Negative for evidence of acute central pulmonary embolus, pneumonia or other acute cardiopulmonary process. 2. Moderate image degradation secondary to significant respiratory and patient related motion artifact. 3. Moderately advanced centrilobular emphysema. 4. Multiple remote right-sided rib fractures noted incidentally.   Electronically Signed   By: Jacqulynn Cadet M.D.   On: 03/28/2015 13:50    Medical Consultants:  Psychiatry Neurology   Other Consultants:  None   IAnti-Infectives:   None    Leisa Lenz, MD  [facility] Pager [phone]  Time spent in minutes: 25 minutes  If 7PM-7AM, please contact night-coverage [email or web address] Password [email or web address] 03/30/2015, 10:14 AM   LOS: 2 days    HPI/Subjective: No acute overnight events.  Patient is resting, on Precedex drip.  Objective: Filed Vitals:   03/30/15 0532 03/30/15 0700 03/30/15 0722 03/30/15 0800  BP: 120/49   95/45  Pulse:    66  Temp:  97.9 F (36.6 C)  97.9 F (36.6 C)  TempSrc:    Core (Comment)  Resp:  14  13  Height:      Weight:      SpO2:   97% 100%    Intake/Output Summary (Last 24 hours) at 03/30/15 1014 Last data filed at 03/30/15 0827  Gross per 24 hour  Intake 647.64 ml  Output    955 ml  Net -307.36 ml     Exam:   General:  Pt is   Cardiovascular:  Rate controlled, appreciate S1-S2  Respiratory:  No wheezing or rhonchi  Abdomen:  Nontender, nondistended, appreciate bowel sounds  Extremities: No leg swelling, pulses palpable  Neuro:  No focal deficit  Data Reviewed: Basic Metabolic Panel:  Recent Labs Lab 03/28/15 1055 03/28/15 1832 03/29/15 0342 03/30/15 0332  NA 135 136 136 142  K 3.3* 2.9* 3.4* 3.1*  CL 93* 97* 98* 104  CO2 24 25 27 30   GLUCOSE 144* 153* 147* 108*  BUN 13 15 18  27*  CREATININE 0.83 0.84 0.76 0.79  CALCIUM 8.3* 7.7* 8.1* 8.4*  MG  --  2.1  --  2.2  PHOS  --  3.5  --   --    Liver Function Tests:  Recent Labs Lab 03/28/15 1055 03/28/15 1832 03/29/15 0342  AST 36 44* 48*  ALT 16 18 21   ALKPHOS 70 60 63  BILITOT 0.5 0.8 0.5  PROT 7.1 6.1* 6.4*  ALBUMIN 3.4* 2.9* 3.1*   No results for input(s): LIPASE, AMYLASE in  the last 168 hours.  Recent Labs Lab 03/28/15 1056 03/28/15 1830  AMMONIA SAMPLE TOO OLD, NOTIFIED AMBER AT 1600 03/28/15 HOOKEB 12   CBC:  Recent Labs Lab 03/28/15 1055 03/28/15 1832 03/29/15 0342 03/30/15 0332  WBC 6.8 5.9 8.2 6.3  NEUTROABS 5.8 5.5  --   --   HGB 11.7* 10.4* 11.2* 9.8*  HCT 36.9 31.8* 34.6* 30.3*  MCV 90.4 89.8 90.3 91.5  PLT 357 280 281 238   Cardiac Enzymes: No results for input(s): CKTOTAL, CKMB, CKMBINDEX, TROPONINI in the last 168 hours. BNP: Invalid input(s): POCBNP CBG:  Recent Labs Lab 03/29/15 0730 03/30/15 0814  GLUCAP 124* 99    Recent Results (from the past 240 hour(s))  MRSA PCR Screening     Status: None   Collection Time: 03/28/15  4:00 PM  Result Value Ref Range Status   MRSA by PCR NEGATIVE NEGATIVE Final     Scheduled Meds: . antiseptic oral rinse  7 mL Mouth Rinse BID  . antiseptic oral rinse  7 mL Mouth Rinse q12n4p  . chlorhexidine  15 mL Mouth Rinse BID  . folic acid  1 mg Intravenous Daily  . furosemide  20 mg Intravenous BID  . hydrALAZINE  10 mg  Intravenous 3 times per day  . ipratropium-albuterol  3 mL Nebulization TID  . multivitamin with minerals  1 tablet Oral Daily  . pantoprazole (PROTONIX) IV  40 mg Intravenous Q24H  . pneumococcal 23 valent vaccine  0.5 mL Intramuscular Tomorrow-1000  . potassium chloride  10 mEq Intravenous Q1 Hr x 3  . sodium chloride  3 mL Intravenous Q12H  . thiamine  100 mg Intravenous Daily   Continuous Infusions: . sodium chloride Stopped (03/28/15 2015)  . dexmedetomidine 0.5 mcg/kg/hr (03/30/15 0700)

## 2015-03-31 ENCOUNTER — Inpatient Hospital Stay (HOSPITAL_COMMUNITY): Payer: Medicare Other

## 2015-03-31 ENCOUNTER — Inpatient Hospital Stay (HOSPITAL_COMMUNITY)
Admit: 2015-03-31 | Discharge: 2015-03-31 | Disposition: A | Discharge status: Home / Self Care | Payer: Medicare Other | Attending: Internal Medicine | Admitting: Internal Medicine

## 2015-03-31 DIAGNOSIS — R062 Wheezing: Secondary | ICD-10-CM | POA: Diagnosis present

## 2015-03-31 DIAGNOSIS — R400 Somnolence: Secondary | ICD-10-CM

## 2015-03-31 DIAGNOSIS — A419 Sepsis, unspecified organism: Secondary | ICD-10-CM | POA: Diagnosis present

## 2015-03-31 DIAGNOSIS — R6521 Severe sepsis with septic shock: Secondary | ICD-10-CM

## 2015-03-31 DIAGNOSIS — E872 Acidosis: Secondary | ICD-10-CM

## 2015-03-31 DIAGNOSIS — R0602 Shortness of breath: Secondary | ICD-10-CM | POA: Diagnosis present

## 2015-03-31 LAB — CBC
HCT: 34.2 % — ABNORMAL LOW (ref 36.0–46.0)
Hemoglobin: 10.6 g/dL — ABNORMAL LOW (ref 12.0–15.0)
MCH: 28.6 pg (ref 26.0–34.0)
MCHC: 31 g/dL (ref 30.0–36.0)
MCV: 92.4 fL (ref 78.0–100.0)
PLATELETS: 216 10*3/uL (ref 150–400)
RBC: 3.7 MIL/uL — ABNORMAL LOW (ref 3.87–5.11)
RDW: 15 % (ref 11.5–15.5)
WBC: 5.6 10*3/uL (ref 4.0–10.5)

## 2015-03-31 LAB — GLUCOSE, CAPILLARY: GLUCOSE-CAPILLARY: 102 mg/dL — AB (ref 65–99)

## 2015-03-31 LAB — BLOOD GAS, ARTERIAL
ACID-BASE EXCESS: 0.7 mmol/L (ref 0.0–2.0)
Bicarbonate: 23.7 mEq/L (ref 20.0–24.0)
DRAWN BY: 295031
O2 Content: 2 L/min
O2 Saturation: 91.6 %
PATIENT TEMPERATURE: 98.6
PCO2 ART: 33.6 mmHg — AB (ref 35.0–45.0)
PH ART: 7.462 — AB (ref 7.350–7.450)
TCO2: 21.7 mmol/L (ref 0–100)
pO2, Arterial: 67.1 mmHg — ABNORMAL LOW (ref 80.0–100.0)

## 2015-03-31 LAB — BASIC METABOLIC PANEL
Anion gap: 11 (ref 5–15)
BUN: 26 mg/dL — ABNORMAL HIGH (ref 6–20)
CALCIUM: 8.8 mg/dL — AB (ref 8.9–10.3)
CO2: 28 mmol/L (ref 22–32)
CREATININE: 0.7 mg/dL (ref 0.44–1.00)
Chloride: 99 mmol/L — ABNORMAL LOW (ref 101–111)
GFR calc Af Amer: >60 mL/min (ref >60)
GFR calc non Af Amer: >60 mL/min (ref >60)
GLUCOSE: 110 mg/dL — AB (ref 65–99)
Potassium: 3 mmol/L — ABNORMAL LOW (ref 3.5–5.1)
Sodium: 138 mmol/L (ref 135–145)

## 2015-03-31 LAB — AMMONIA: AMMONIA: 14 umol/L (ref 9–35)

## 2015-03-31 MED ORDER — CHLORHEXIDINE GLUCONATE 0.12 % MT SOLN
OROMUCOSAL | Status: AC
  Filled 2015-03-31: qty 15

## 2015-03-31 MED ORDER — POTASSIUM CHLORIDE 10 MEQ/100ML IV SOLN
INTRAVENOUS | Status: AC
  Administered 2015-03-31: 10 meq
  Filled 2015-03-31: qty 100

## 2015-03-31 MED ORDER — POTASSIUM CHLORIDE 10 MEQ/100ML IV SOLN
10.0000 meq | INTRAVENOUS | Status: AC
  Administered 2015-03-31 (×4): 10 meq via INTRAVENOUS
  Filled 2015-03-31 (×3): qty 100

## 2015-03-31 MED ORDER — CLONIDINE HCL 0.3 MG/24HR TD PTWK
0.3000 mg | MEDICATED_PATCH | TRANSDERMAL | Status: DC
  Administered 2015-03-31: 0.3 mg via TRANSDERMAL
  Filled 2015-03-31: qty 1

## 2015-03-31 NOTE — Progress Notes (Signed)
EEG Completed; Results Pending  

## 2015-03-31 NOTE — Progress Notes (Signed)
Subjective: Appears slightly clearer today.   Exam: Filed Vitals:   03/31/15 0800  BP: 148/93  Pulse: 116  Temp: 98.2 F (36.8 C)  Resp: 24   Gen: In bed, NAD CV: tachycardic Resp: non-labored breathing, no acute distress Abd: soft, nt  Neuro: MS: awake, oriented to "[place]" today.  ON:GEXB, fixates and tracks Motor: MAEW Sensory:intact to LT  Pertinent Labs: Mild hypokalemia  Impression: 64 yo F with what is likely a multifactorial delirium including possible EtOH withdrawal, benzodiazepine use, hypoxia, steroid use, ICU delirium. I have a low suspicion of Wernicke's encephalopathy but it is reasonable to continue to treat with high dose thiamine(I will increase this to wernicke's treatment level, 500mg  TID x 3 days) given that this is a low risk intervention. She continues to improve which is reassuring.   Recommendations: 1) continue thiamine for 3 days, then reduce to 100mg  daily 2) continue treating agitation, but weaning precedex as tolerated.   Roland Rack, MD [facility] [phone]  If 7pm- 7am, please page neurology on call as listed in [place].

## 2015-03-31 NOTE — Progress Notes (Signed)
Progress Note   Beth Meyers ZJI:[id number] DOB: [birth year] DOA: 03/28/2015 PCP: Lynne Logan, MD   Brief Narrative:   Beth Meyers is an 64 y.o. female with a PMH of COPD, hypertension, anxiety and depression was admitted 03/28/15 chief complaint of worsening dyspnea. CXR showed bronchitic changes. CT angio chest ruled out pulmonary embolism. 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 Psychiatry and neurology were subsequently consulted.  Assessment/Plan:   Principal Problem:  Acute encephalopathy / Alcohol withdrawal - CT head showed no acute intracranial findings. - UDS and alcohol level were negative on admission. - Attempted MRI brain09/11/2014 but patient was too restless to have MRI completed. - We stopped ativan per patient's daughter request as she wasconcerned that Ativan made patient's mental status worse.  - Neurology consultation performed 03/30/15. Symptoms not felt to be consistent with Wernicke's. B-12 WNL at 594. - Seen by psychiatry 03/30/15. Recommendations were to continue alcohol detox treatment and supportive therapy. - MRI ordered and still pending.  Check EEG. Ammonia not elevated.  Active Problems:  COPD exacerbation / hypoxia - Stable respiratory status. Patient received Solu-Medrol in ED 125 mg followed by 60 mg IV every 12 hours. - Because of the concern that steroids may have worsened mental status Solu-Medrol stopped 03/29/15. - Continue DuoNeb 3 times daily along with as needed nebulizers for shortness of breath or wheezing. - Continue oxygen support via nasal cannula to keep oxygen saturation above 90%. - Spiriva and Symbicort on hold. - ABG does not show hypercarbia.   Essential hypertension - Continue hydralazine as needed. Takes Lasix, Cozaar and Micardis at home.  - We will continue to monitor blood pressure.    Hypokalemia - We'll give  potassium runs 5. Magnesium okay.   Dyslipidemia - Fenofibrate on hold due to altered mental status.    Normocytic anemia -No current indications for transfusion. B-12 okay.   Anxiety and depression - On Precedex drip. Takes Celexa at home.   DVT prophylaxis:  - SCD's bilaterally    Code Status: Full.  Family Communication: Daughter and patient's husband at the bedside.  Disposition Plan: Continues to require SDU level of care.   IV Access:    Peripheral IV   Procedures and diagnostic studies:   Dg Chest 2 View  03/28/2015   CLINICAL DATA:  Shortness of breath and chest pain for 3 days. History of hypertension, asthma, COPD, emphysema.  EXAM: CHEST  2 VIEW  COMPARISON:  None.  FINDINGS: Heart size is normal. There is mild perihilar peribronchial thickening. Emphysematous changes are present with crowded markings at the bases. There are no focal consolidations or pleural effusions. No pulmonary edema.  Numerous old right rib fractures are present. There are osteoporotic type fractures of T6, T8, and T9.  IMPRESSION: 1. Bronchitic changes.  Emphysematous changes. 2.  No focal acute pulmonary abnormality. 3. Osteoporotic type fractures of the thoracic spine. Consider evaluation and treatment for osteoporosis as appropriate.   Electronically Signed   By: Nolon Nations M.D.   On: 03/28/2015 10:50   Ct Angio Chest Pe W/cm &/or Wo Cm  03/28/2015   CLINICAL DATA:  64 year old female with shortness of breath, altered mental status and history of COPD. Patient is on home oxygen.  EXAM: CT ANGIOGRAPHY CHEST WITH CONTRAST  TECHNIQUE: Multidetector CT imaging of the chest was performed using the standard protocol during bolus administration of intravenous contrast. Multiplanar CT image reconstructions and  MIPs were obtained to evaluate the vascular anatomy.  CONTRAST:  124mL OMNIPAQUE IOHEXOL 350 MG/ML SOLN  COMPARISON:  Chest x-ray obtained earlier today  FINDINGS: Mediastinum:  Unremarkable CT appearance of the thyroid gland. No suspicious mediastinal or hilar adenopathy. No soft tissue mediastinal mass. The thoracic esophagus is unremarkable.  Heart/Vascular: Adequate opacification of the pulmonary arteries to the proximal subsegmental level. Evaluation beyond the segmental arteries is limited secondary to significant respiratory motion. No evidence of central pulmonary embolus. The heart is within normal limits for size. There is no pericardial effusion. Evaluation for coronary artery calcium significantly limited secondary to patient motion. No evidence of aortic aneurysm. Trace atherosclerotic calcifications present at the origins of the great vessels.  Lungs/Pleura: Mild biapical pleural parenchymal scarring. Moderately advanced predominantly centrilobular pulmonary emphysema. Trace dependent atelectasis. No focal airspace consolidation, evidence of pulmonary edema or suspicious nodule or mass. Evaluation for small nodules is limited by respiratory motion.  Bones/Soft Tissues: Multiple remote healed right-sided rib fractures. No acute fracture or aggressive appearing lytic or blastic osseous lesion.  Upper Abdomen: Visualized upper abdominal organs are unremarkable.  Review of the MIP images confirms the above findings.  IMPRESSION: 1. Negative for evidence of acute central pulmonary embolus, pneumonia or other acute cardiopulmonary process. 2. Moderate image degradation secondary to significant respiratory and patient related motion artifact. 3. Moderately advanced centrilobular emphysema. 4. Multiple remote right-sided rib fractures noted incidentally.   Electronically Signed   By: Jacqulynn Cadet M.D.   On: 03/28/2015 13:50   Dg Chest Port 1 View  03/29/2015   CLINICAL DATA:  Pneumonia, short of breath, unresponsive  EXAM: PORTABLE CHEST - 1 VIEW  COMPARISON:  Chest CT 03/28/2015, CT thorax 03/28/2015  FINDINGS: Normal cardiac silhouette. There is coarsened central  bronchovascular markings similar prior. No focal infiltrate. No pneumothorax.  IMPRESSION: Chronic interstitial markings not changed from prior. No acute findings.   Electronically Signed   By: Suzy Bouchard M.D.   On: 03/29/2015 21:57     Medical Consultants:    Greta Doom, MD, Neurology  Ambrose Finland, MD  Anti-Infectives:    None.  Subjective:    [place] is not responsive, family reports that she has gotten agitated in the past 24 hours, remains on Precedex drip.  Objective:    Filed Vitals:   03/31/15 0600 03/31/15 0700 03/31/15 0800 03/31/15 0823  BP: 135/62  148/93   Pulse: 102 25 116   Temp: 99.3 F (37.4 C) 98.4 F (36.9 C) 98.2 F (36.8 C)   TempSrc: Core (Comment) Core (Comment) Core (Comment)   Resp: 23 20 24    Height:      Weight:      SpO2: 99% 95% 100% 98%    Intake/Output Summary (Last 24 hours) at 03/31/15 1247 Last data filed at 03/31/15 1000  Gross per 24 hour  Intake 823.71 ml  Output   2035 ml  Net -1211.29 ml   Filed Weights   03/28/15 1625 03/29/15 0500  Weight: 82 kg (180 lb 12.4 oz) 80.7 kg (177 lb 14.6 oz)    Exam: Gen:  NAD, sedated Cardiovascular:  Tachy, No M/R/G Respiratory:  Lungs CTAB Gastrointestinal:  Abdomen soft, NT/ND, + BS Extremities:  No C/E/C   Data Reviewed:    Labs: Basic Metabolic Panel:  Recent Labs Lab 03/28/15 1055 03/28/15 1832 03/29/15 0342 03/30/15 0332 03/31/15 0237  NA 135 136 136 142 138  K 3.3* 2.9* 3.4* 3.1* 3.0*  CL 93* 97*  98* 104 99*  CO2 24 25 27 30 28   GLUCOSE 144* 153* 147* 108* 110*  BUN 13 15 18  27* 26*  CREATININE 0.83 0.84 0.76 0.79 0.70  CALCIUM 8.3* 7.7* 8.1* 8.4* 8.8*  MG  --  2.1  --  2.2  --   PHOS  --  3.5  --   --   --    GFR Estimated Creatinine Clearance: 69.6 mL/min (by C-G formula based on Cr of 0.7). Liver Function Tests:  Recent Labs Lab 03/28/15 1055 03/28/15 1832 03/29/15 0342  AST 36 44* 48*  ALT 16 18 21     ALKPHOS 70 60 63  BILITOT 0.5 0.8 0.5  PROT 7.1 6.1* 6.4*  ALBUMIN 3.4* 2.9* 3.1*    Recent Labs Lab 03/28/15 1056 03/28/15 1830 03/31/15 1126  AMMONIA SAMPLE TOO OLD, NOTIFIED AMBER AT 1600 03/28/15 HOOKEB 12 14    CBC:  Recent Labs Lab 03/28/15 1055 03/28/15 1832 03/29/15 0342 03/30/15 0332 03/31/15 0237  WBC 6.8 5.9 8.2 6.3 5.6  NEUTROABS 5.8 5.5  --   --   --   HGB 11.7* 10.4* 11.2* 9.8* 10.6*  HCT 36.9 31.8* 34.6* 30.3* 34.2*  MCV 90.4 89.8 90.3 91.5 92.4  PLT 357 280 281 238 216   CBG:  Recent Labs Lab 03/29/15 0730 03/30/15 0814 03/31/15 0840  GLUCAP 124* 99 102*   Thyroid function studies:  Recent Labs  03/28/15 1832  TSH 0.335*   Anemia work up:  Recent Labs  03/30/15 1128  VITAMINB12 594   Sepsis Labs:  Recent Labs Lab 03/28/15 1113 03/28/15 1832 03/29/15 0342 03/30/15 0332 03/31/15 0237  WBC  --  5.9 8.2 6.3 5.6  LATICACIDVEN 2.12*  --   --   --   --    ABG    Component Value Date/Time   PHART 7.462* 03/31/2015 1041   PCO2ART 33.6* 03/31/2015 1041   PO2ART 67.1* 03/31/2015 1041   HCO3 23.7 03/31/2015 1041   TCO2 21.7 03/31/2015 1041   ACIDBASEDEF 1.9 03/28/2015 2225   O2SAT 91.6 03/31/2015 1041    Microbiology Recent Results (from the past 240 hour(s))  MRSA PCR Screening     Status: None   Collection Time: 03/28/15  4:00 PM  Result Value Ref Range Status   MRSA by PCR NEGATIVE NEGATIVE Final    Comment:        The GeneXpert MRSA Assay (FDA approved for NASAL specimens only), is one component of a comprehensive MRSA colonization surveillance program. It is not intended to diagnose MRSA infection nor to guide or monitor treatment for MRSA infections.      Medications:   . antiseptic oral rinse  7 mL Mouth Rinse BID  . antiseptic oral rinse  7 mL Mouth Rinse q12n4p  . chlorhexidine  15 mL Mouth Rinse BID  . folic acid  1 mg Intravenous Daily  . hydrALAZINE  10 mg Intravenous 3 times per day  .  ipratropium-albuterol  3 mL Nebulization TID  . multivitamin with minerals  1 tablet Oral Daily  . pantoprazole (PROTONIX) IV  40 mg Intravenous Q24H  . potassium chloride  10 mEq Intravenous Q1 Hr x 5  . sodium chloride  3 mL Intravenous Q12H  . thiamine  500 mg Intravenous TID   Continuous Infusions: . sodium chloride 75 mL/hr at 03/31/15 1059  . dexmedetomidine 1 mcg/kg/hr (03/31/15 1125)    Time spent: 35 minutes with > 50% of time discussing current diagnostic test results,  clinical impression and plan of care.   LOS: 3 days   [facility] Pager [phone]. If unable to reach me by pager, please call my cell phone at [phone].  *Please refer to [email or web address], password [email or web address] to get updated schedule on who will round on this patient, as hospitalists switch teams weekly. If 7PM-7AM, please contact night-coverage at [email or web address], password [email or web address] for any overnight needs.  03/31/2015, 12:47 PM

## 2015-03-31 NOTE — Progress Notes (Signed)
Pt came down to mri on 03/29/15. Pt too combative to have mri. Pt is still too combative to obtain mri. Pt is currently on a precedex drip, when pt is more stable and alert mri can be re-ordered spoke with RN denise on 03/31/15 @12 :45pm about d/c order.  Ty Hilts, RT (R) (MR) (ARRT)

## 2015-03-31 NOTE — Progress Notes (Signed)
Clinical Social Work  CSW and psych MD rounded on patient together. Patient laying in bed and drowsy during assessment. Patient admits to drinking alcohol but reports she is not interested in treatment at this time. Patient reports she wants to get out of bed and drink water. RN came to room and will assist patient. CSW will follow up at later time to continue to discuss treatment plans with patient.  [place], [place] [phone]
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
## 2015-03-31 NOTE — Procedures (Signed)
History: 64 yo F with delirium  Sedation: Precedex  Technique: This is a 19 channel routine scalp EEG performed at the bedside with bipolar and monopolar montages arranged in accordance to the international 10/20 system of electrode placement. One channel was dedicated to EKG recording.    Background: The background consists predominantly of generalized irregular delta and theta activity. There is a posterior dominant rhythm of 9 Hz that is poorly sustained and poorly formed. There are rare triphasic waves seen.   Photic stimulation: Physiologic driving is not performed  EEG Abnormalities: 1) triphasic waves 2) Generalized slow activity  Clinical Interpretation: This EEG is consistent with a generalized non-specific cerebral dysfunction(encephalopathy). There was no seizure or seizure predisposition recorded on this study.   Roland Rack, MD [facility] [phone]  If 7pm- 7am, please page neurology on call as listed in [place].

## 2015-04-01 LAB — BASIC METABOLIC PANEL
ANION GAP: 12 (ref 5–15)
BUN: 21 mg/dL — ABNORMAL HIGH (ref 6–20)
CHLORIDE: 107 mmol/L (ref 101–111)
CO2: 24 mmol/L (ref 22–32)
CREATININE: 0.67 mg/dL (ref 0.44–1.00)
Calcium: 8.8 mg/dL — ABNORMAL LOW (ref 8.9–10.3)
GFR calc non Af Amer: >60 mL/min (ref >60)
Glucose, Bld: 106 mg/dL — ABNORMAL HIGH (ref 65–99)
Potassium: 4.4 mmol/L (ref 3.5–5.1)
Sodium: 143 mmol/L (ref 135–145)

## 2015-04-01 LAB — GLUCOSE, CAPILLARY: Glucose-Capillary: 103 mg/dL — ABNORMAL HIGH (ref 65–99)

## 2015-04-01 MED ORDER — VITAMIN B-1 100 MG PO TABS
100.0000 mg | ORAL_TABLET | Freq: Every day | ORAL | Status: DC
Start: 2015-04-02 — End: 2015-04-03

## 2015-04-01 MED ORDER — BUDESONIDE-FORMOTEROL FUMARATE 160-4.5 MCG/ACT IN AERO
2.0000 | INHALATION_SPRAY | Freq: Two times a day (BID) | RESPIRATORY_TRACT | Status: DC
  Administered 2015-04-01 – 2015-04-03 (×5): 2 via RESPIRATORY_TRACT
  Filled 2015-04-01: qty 6

## 2015-04-01 MED ORDER — SODIUM CHLORIDE 0.9 % IV BOLUS (SEPSIS)
500.0000 mL | Freq: Once | INTRAVENOUS | Status: AC
  Administered 2015-04-01: 500 mL via INTRAVENOUS

## 2015-04-01 NOTE — Evaluation (Addendum)
SLP Cancellation Note  Patient Details Name: Beth Meyers MRN: [id number] DOB: 1950-12-28   Cancelled treatment:       Reason Eval/Treat Not Completed: Medical issues which prohibited therapy (pt on precedex drip for DTs and is currently lethargic- pt requesting mouth swabed upon awakening but coughing during oral care per RN, asked RN to page if pt awakens adequately for eval/po )   Luanna Salk, [place] [facility] SLP [phone]

## 2015-04-01 NOTE — Progress Notes (Signed)
Initial Nutrition Assessment  DOCUMENTATION CODES:   Obesity unspecified  INTERVENTION:  - Will monitor for ability to advance diet and needs at that time - RD will continue to monitor  NUTRITION DIAGNOSIS:   Inadequate oral intake related to inability to eat as evidenced by NPO status.  GOAL:   Patient will meet greater than or equal to 90% of their needs  MONITOR:   Diet advancement, Weight trends, Labs, I & O's, Other (Comment)  REASON FOR ASSESSMENT:   Low Braden  ASSESSMENT:   64 year old female with past medical history of COPD, anxiety and depression, hypertension who presented to [facility] ED with worsening shortness of breath over past few days prior to this admission. She tried home inhaler and nebulizer treatment but without significant symptomatic relief. She had associated anxiety but no chest pain, no palpitations, No cough or fevers, No abdominal pain, nausea or vomiting. She was given solumedrol and nebulizer treatment by EMS and this seemed to help out a little.   Pt seen for low Braden. BMI indicates obesity. Pt has been NPO since admission. SLP has been unable to see pt due to pt coughing with oral care, not appropriate for PO trials at this time.   Husband who is at bedside and pt provides all information. PTA, pt's appetite was decreased for several weeks. During that time she would only snack rather than eating full meals. She has not had anything to eat since the AM of 9/4.  Pt denies any recent weight changes despite decreased appetite and intakes. She states that PTA she was nauseated constantly but that medication she was prescribed for this at recent doctor's visit did not seem to help.  No recent weight hx available in medical chart. Did not complete physical assessment due to bilateral wrist restraints and pt's current cognition, which is improving. Will complete physical assessment on follow-up.  Unable to meet needs. Medications reviewed. Labs reviewed;  CBGs: 99-124 mg/dL, BUN elevated but trending down, Ca: 8.8 mg/dL.   Diet Order:  NPO  Skin:  Reviewed, no issues  Last BM:  PTA  Height:   Ht Readings from Last 1 Encounters:  03/28/15 5\' 3"  (1.6 m)    Weight:   Wt Readings from Last 1 Encounters:  04/01/15 178 lb 12.7 oz (81.1 kg)    Ideal Body Weight:  52.27 kg (kg)  BMI:  Body mass index is 31.68 kg/(m^2).  Estimated Nutritional Needs:   Kcal:  1600-1800  Protein:  65-75 grams  Fluid:  2-2.5 L/day  EDUCATION NEEDS:   No education needs identified at this time      Jarome Matin, RD, LDN Inpatient Clinical Dietitian Pager # [phone] After hours/weekend pager # [phone]

## 2015-04-01 NOTE — Plan of Care (Deleted)
Problem: Phase I Progression Outcomes Goal: Voiding-avoid urinary catheter unless indicated Outcome: Progressing Condom catheter in place,

## 2015-04-01 NOTE — Progress Notes (Signed)
Clinical Social Work  CSW went to room but patient remains sedated and on Precedex drip. RN reports they were able to reduce drip. CSW will continue to follow and will assess patient once she is more alert.  [place], [place] [phone]

## 2015-04-01 NOTE — Progress Notes (Signed)
Progress Note   Beth Meyers IRW:[id number] DOB: Nov 22, 1950 DOA: 03/28/2015 PCP: Lynne Logan, MD   Brief Narrative:   Beth Meyers is an 64 y.o. female with a PMH of COPD, hypertension, anxiety and depression was admitted 03/28/15 chief complaint of worsening dyspnea. CXR showed bronchitic changes. CT angio chest ruled out pulmonary embolism. 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 Psychiatry and neurology were subsequently consulted.  Assessment/Plan:   Principal Problem:  Acute encephalopathy / Alcohol withdrawal - CT head showed no acute intracranial findings. - UDS and alcohol level were negative on admission. - Attempted MRI brain09/11/2014 but patient was too restless to have MRI completed. - We stopped ativan per patient's daughter request as she wasconcerned that Ativan made patient's mental status worse.  - Neurology consultation performed 03/30/15. Symptoms not felt to be consistent with Wernicke's. B-12 WNL at 594. - Seen by psychiatry 03/30/15. Recommendations were to continue alcohol detox treatment and supportive therapy. - EEG negative for epileptiform activity. Ammonia not elevated. - More awake and alert today.  Wean Precedex. - Speech therapy consult for swallowing evaluation.  Active Problems:  COPD exacerbation / hypoxia - Stable respiratory status. Patient received Solu-Medrol in ED 125 mg followed by 60 mg IV every 12 hours. - Because of the concern that steroids may have worsened mental status Solu-Medrol stopped 03/29/15. - Continue DuoNeb 3 times daily along with as needed nebulizers for shortness of breath or wheezing. - Continue oxygen support via nasal cannula to keep oxygen saturation above 90%. - Spiriva and Symbicort on hold. Resume Symbicort. Some upper airway congestion noted. - ABG done 03/31/15 and did not show hypercarbia.   Essential  hypertension - Continue hydralazine as needed. Takes Lasix, Cozaar and Micardis at home.  - We will continue to monitor blood pressure.    Hypokalemia - Repleted.   Dyslipidemia - Fenofibrate on hold due to altered mental status.    Normocytic anemia -No current indications for transfusion. B-12 okay.   Anxiety and depression - On Precedex drip. Wean. Takes Celexa at home.    DVT prophylaxis:  - SCD's bilaterally    Code Status: Full.  Family Communication: Daughter and patient's husband at the bedside 03/31/15, no family at bedside this a.m.  Disposition Plan: Continues to require SDU level of care.   IV Access:    Peripheral IV   Procedures and diagnostic studies:   Dg Chest 2 View  03/28/2015   CLINICAL DATA:  Shortness of breath and chest pain for 3 days. History of hypertension, asthma, COPD, emphysema.  EXAM: CHEST  2 VIEW  COMPARISON:  None.  FINDINGS: Heart size is normal. There is mild perihilar peribronchial thickening. Emphysematous changes are present with crowded markings at the bases. There are no focal consolidations or pleural effusions. No pulmonary edema.  Numerous old right rib fractures are present. There are osteoporotic type fractures of T6, T8, and T9.  IMPRESSION: 1. Bronchitic changes.  Emphysematous changes. 2.  No focal acute pulmonary abnormality. 3. Osteoporotic type fractures of the thoracic spine. Consider evaluation and treatment for osteoporosis as appropriate.   Electronically Signed   By: Nolon Nations M.D.   On: 03/28/2015 10:50   Ct Angio Chest Pe W/cm &/or Wo Cm  03/28/2015   CLINICAL DATA:  64 year old female with shortness of breath, altered mental status and history of COPD. Patient is on home oxygen.  EXAM: CT ANGIOGRAPHY CHEST WITH  CONTRAST  TECHNIQUE: Multidetector CT imaging of the chest was performed using the standard protocol during bolus administration of intravenous contrast. Multiplanar CT image reconstructions and MIPs  were obtained to evaluate the vascular anatomy.  CONTRAST:  188mL OMNIPAQUE IOHEXOL 350 MG/ML SOLN  COMPARISON:  Chest x-ray obtained earlier today  FINDINGS: Mediastinum: Unremarkable CT appearance of the thyroid gland. No suspicious mediastinal or hilar adenopathy. No soft tissue mediastinal mass. The thoracic esophagus is unremarkable.  Heart/Vascular: Adequate opacification of the pulmonary arteries to the proximal subsegmental level. Evaluation beyond the segmental arteries is limited secondary to significant respiratory motion. No evidence of central pulmonary embolus. The heart is within normal limits for size. There is no pericardial effusion. Evaluation for coronary artery calcium significantly limited secondary to patient motion. No evidence of aortic aneurysm. Trace atherosclerotic calcifications present at the origins of the great vessels.  Lungs/Pleura: Mild biapical pleural parenchymal scarring. Moderately advanced predominantly centrilobular pulmonary emphysema. Trace dependent atelectasis. No focal airspace consolidation, evidence of pulmonary edema or suspicious nodule or mass. Evaluation for small nodules is limited by respiratory motion.  Bones/Soft Tissues: Multiple remote healed right-sided rib fractures. No acute fracture or aggressive appearing lytic or blastic osseous lesion.  Upper Abdomen: Visualized upper abdominal organs are unremarkable.  Review of the MIP images confirms the above findings.  IMPRESSION: 1. Negative for evidence of acute central pulmonary embolus, pneumonia or other acute cardiopulmonary process. 2. Moderate image degradation secondary to significant respiratory and patient related motion artifact. 3. Moderately advanced centrilobular emphysema. 4. Multiple remote right-sided rib fractures noted incidentally.   Electronically Signed   By: Jacqulynn Cadet M.D.   On: 03/28/2015 13:50   Dg Chest Port 1 View  03/29/2015   CLINICAL DATA:  Pneumonia, short of breath,  unresponsive  EXAM: PORTABLE CHEST - 1 VIEW  COMPARISON:  Chest CT 03/28/2015, CT thorax 03/28/2015  FINDINGS: Normal cardiac silhouette. There is coarsened central bronchovascular markings similar prior. No focal infiltrate. No pneumothorax.  IMPRESSION: Chronic interstitial markings not changed from prior. No acute findings.   Electronically Signed   By: Suzy Bouchard M.D.   On: 03/29/2015 21:57     Medical Consultants:    Greta Doom, MD, Neurology  Ambrose Finland, MD  Anti-Infectives:    None.  Subjective:   Beth Meyers is more awake and alert.  Knows where she is and what month it is.  Denies pain.  Some shortness of breath, but says this is chronic.  Objective:    Filed Vitals:   04/01/15 0300 04/01/15 0400 04/01/15 0500 04/01/15 0600  BP: 177/98 169/80  101/70  Pulse: 68 31  78  Temp: 98.4 F (36.9 C) 98.8 F (37.1 C)  99.3 F (37.4 C)  TempSrc:  Core (Comment)    Resp: 25 20  15   Height:      Weight:   81.1 kg (178 lb 12.7 oz)   SpO2: 100% 86%  100%    Intake/Output Summary (Last 24 hours) at 04/01/15 0700 Last data filed at 04/01/15 0600  Gross per 24 hour  Intake 2591.62 ml  Output    475 ml  Net 2116.62 ml   Filed Weights   03/28/15 1625 03/29/15 0500 04/01/15 0500  Weight: 82 kg (180 lb 12.4 oz) 80.7 kg (177 lb 14.6 oz) 81.1 kg (178 lb 12.7 oz)    Exam: Gen:  NAD, irritable Cardiovascular:  RRR, No M/R/G Respiratory:  Lungs with rhonchi on right Gastrointestinal:  Abdomen soft, NT/ND, +  BS Extremities:  No C/E/C   Data Reviewed:    Labs: Basic Metabolic Panel:  Recent Labs Lab 03/28/15 1832 03/29/15 0342 03/30/15 0332 03/31/15 0237 04/01/15 0335  NA 136 136 142 138 143  K 2.9* 3.4* 3.1* 3.0* 4.4  CL 97* 98* 104 99* 107  CO2 25 27 30 28 24   GLUCOSE 153* 147* 108* 110* 106*  BUN 15 18 27* 26* 21*  CREATININE 0.84 0.76 0.79 0.70 0.67  CALCIUM 7.7* 8.1* 8.4* 8.8* 8.8*  MG 2.1  --  2.2  --   --   PHOS  3.5  --   --   --   --    GFR Estimated Creatinine Clearance: 69.8 mL/min (by C-G formula based on Cr of 0.67). Liver Function Tests:  Recent Labs Lab 03/28/15 1055 03/28/15 1832 03/29/15 0342  AST 36 44* 48*  ALT 16 18 21   ALKPHOS 70 60 63  BILITOT 0.5 0.8 0.5  PROT 7.1 6.1* 6.4*  ALBUMIN 3.4* 2.9* 3.1*    Recent Labs Lab 03/28/15 1056 03/28/15 1830 03/31/15 1126  AMMONIA SAMPLE TOO OLD, NOTIFIED AMBER AT 1600 03/28/15 HOOKEB 12 14    CBC:  Recent Labs Lab 03/28/15 1055 03/28/15 1832 03/29/15 0342 03/30/15 0332 03/31/15 0237  WBC 6.8 5.9 8.2 6.3 5.6  NEUTROABS 5.8 5.5  --   --   --   HGB 11.7* 10.4* 11.2* 9.8* 10.6*  HCT 36.9 31.8* 34.6* 30.3* 34.2*  MCV 90.4 89.8 90.3 91.5 92.4  PLT 357 280 281 238 216   CBG:  Recent Labs Lab 03/29/15 0730 03/30/15 0814 03/31/15 0840  GLUCAP 124* 99 102*   Anemia work up:  Recent Labs  03/30/15 1128  VITAMINB12 594   Sepsis Labs:  Recent Labs Lab 03/28/15 1113 03/28/15 1832 03/29/15 0342 03/30/15 0332 03/31/15 0237  WBC  --  5.9 8.2 6.3 5.6  LATICACIDVEN 2.12*  --   --   --   --    ABG    Component Value Date/Time   PHART 7.462* 03/31/2015 1041   PCO2ART 33.6* 03/31/2015 1041   PO2ART 67.1* 03/31/2015 1041   HCO3 23.7 03/31/2015 1041   TCO2 21.7 03/31/2015 1041   ACIDBASEDEF 1.9 03/28/2015 2225   O2SAT 91.6 03/31/2015 1041    Microbiology Recent Results (from the past 240 hour(s))  MRSA PCR Screening     Status: None   Collection Time: 03/28/15  4:00 PM  Result Value Ref Range Status   MRSA by PCR NEGATIVE NEGATIVE Final    Comment:        The GeneXpert MRSA Assay (FDA approved for NASAL specimens only), is one component of a comprehensive MRSA colonization surveillance program. It is not intended to diagnose MRSA infection nor to guide or monitor treatment for MRSA infections.      Medications:   . antiseptic oral rinse  7 mL Mouth Rinse BID  . antiseptic oral rinse  7 mL Mouth  Rinse q12n4p  . chlorhexidine  15 mL Mouth Rinse BID  . cloNIDine  0.3 mg Transdermal Weekly  . folic acid  1 mg Intravenous Daily  . hydrALAZINE  10 mg Intravenous 3 times per day  . ipratropium-albuterol  3 mL Nebulization TID  . multivitamin with minerals  1 tablet Oral Daily  . pantoprazole (PROTONIX) IV  40 mg Intravenous Q24H  . sodium chloride  3 mL Intravenous Q12H  . thiamine  500 mg Intravenous TID   Continuous Infusions: . sodium chloride  75 mL/hr at 03/31/15 1059  . dexmedetomidine 1.2 mcg/kg/hr (04/01/15 0300)    Time spent: 35 minutes.  The patient is medically complex and requires high complexity decision making. .   LOS: 4 days   [facility] Pager [phone]. If unable to reach me by pager, please call my cell phone at [phone].  *Please refer to [email or web address], password [email or web address] to get updated schedule on who will round on this patient, as hospitalists switch teams weekly. If 7PM-7AM, please contact night-coverage at [email or web address], password [email or web address] for any overnight needs.  04/01/2015, 7:00 AM

## 2015-04-01 NOTE — Progress Notes (Signed)
Subjective: Continues to gradually improve. Per family, she would not normally know the president or year.   Exam: Filed Vitals:   04/01/15 0800  BP: 135/83  Pulse: 96  Temp: 98.8 F (37.1 C)  Resp: 25   Gen: In bed, NAD CV: tachycardic Resp: non-labored breathing, no acute distress Abd: soft, nt  Neuro: MS: awake, oriented to "[place]" but not year(baseline per family) BT:DVVO, fixates and tracks Motor: MAEW Sensory:intact to LT  Pertinent Labs: Mild hypokalemia  Impression: 64 yo F with what is likely a multifactorial delirium including possible EtOH withdrawal, benzodiazepine use, hypoxia, steroid use, ICU delirium. I have a low suspicion of Wernicke's encephalopathy but it is reasonable to continue to treat with high dose thiamine(I will increase this to wernicke's treatment level, 500mg  TID x 3 days) given that this is a low risk intervention. She continues to improve which is reassuring.   Given history that she would not normally know year or president, I do wonder if she does have some underlying dementia.   Recommendations: 1) continue thiamine for 3 days, then reduce to 100mg  daily 2) continue treating agitation, but weaning precedex as tolerated.  3) Please call if the patient does not continue slowly improving as expected.   Roland Rack, MD [facility] [phone]  If 7pm- 7am, please page neurology on call as listed in [place].

## 2015-04-02 LAB — GLUCOSE, CAPILLARY: GLUCOSE-CAPILLARY: 84 mg/dL (ref 65–99)

## 2015-04-02 LAB — BLOOD GAS, ARTERIAL
Acid-base deficit: 6.2 mmol/L — ABNORMAL HIGH (ref 0.0–2.0)
Bicarbonate: 17.9 mEq/L — ABNORMAL LOW (ref 20.0–24.0)
Drawn by: 295031
O2 CONTENT: 4 L/min
O2 Saturation: 93.5 %
PCO2 ART: 32.4 mmHg — AB (ref 35.0–45.0)
PH ART: 7.36 (ref 7.350–7.450)
Patient temperature: 98.6
TCO2: 16.4 mmol/L (ref 0–100)
pO2, Arterial: 79.5 mmHg — ABNORMAL LOW (ref 80.0–100.0)

## 2015-04-02 LAB — BASIC METABOLIC PANEL
Anion gap: 11 (ref 5–15)
BUN: 17 mg/dL (ref 6–20)
CALCIUM: 8.8 mg/dL — AB (ref 8.9–10.3)
CO2: 21 mmol/L — AB (ref 22–32)
CREATININE: 0.6 mg/dL (ref 0.44–1.00)
Chloride: 113 mmol/L — ABNORMAL HIGH (ref 101–111)
GFR calc Af Amer: >60 mL/min (ref >60)
GFR calc non Af Amer: >60 mL/min (ref >60)
GLUCOSE: 99 mg/dL (ref 65–99)
Potassium: 3.4 mmol/L — ABNORMAL LOW (ref 3.5–5.1)
Sodium: 145 mmol/L (ref 135–145)

## 2015-04-02 MED ORDER — RESOURCE THICKENUP CLEAR PO POWD
ORAL | Status: DC | PRN
  Filled 2015-04-02: qty 125

## 2015-04-02 MED ORDER — POTASSIUM CHLORIDE 10 MEQ/100ML IV SOLN
10.0000 meq | INTRAVENOUS | Status: AC
  Administered 2015-04-02 (×4): 10 meq via INTRAVENOUS
  Filled 2015-04-02: qty 100

## 2015-04-02 MED ORDER — LOSARTAN POTASSIUM 50 MG PO TABS
100.0000 mg | ORAL_TABLET | Freq: Every day | ORAL | Status: DC
  Administered 2015-04-02: 100 mg via ORAL
  Filled 2015-04-02 (×2): qty 2

## 2015-04-02 MED ORDER — POTASSIUM CHLORIDE IN NACL 20-0.9 MEQ/L-% IV SOLN
INTRAVENOUS | Status: DC
  Administered 2015-04-02: 08:00:00 via INTRAVENOUS
  Filled 2015-04-02 (×2): qty 1000

## 2015-04-02 MED ORDER — FUROSEMIDE 40 MG PO TABS
40.0000 mg | ORAL_TABLET | Freq: Two times a day (BID) | ORAL | Status: DC
  Administered 2015-04-02 (×2): 40 mg via ORAL
  Filled 2015-04-02 (×3): qty 1

## 2015-04-02 MED ORDER — PROMETHAZINE HCL 25 MG/ML IJ SOLN
12.5000 mg | Freq: Once | INTRAMUSCULAR | Status: AC
  Administered 2015-04-02: 12.5 mg via INTRAVENOUS
  Filled 2015-04-02: qty 1

## 2015-04-02 MED ORDER — PREDNISONE 20 MG PO TABS
40.0000 mg | ORAL_TABLET | Freq: Every day | ORAL | Status: DC
  Filled 2015-04-02: qty 2

## 2015-04-02 NOTE — Progress Notes (Signed)
Date:  Sept.9, 2016 U.R. performed for needs and level of care. Iv precedex drip and CIAW protocol. Will continue to follow for Case Management needs.  Velva Harman, RN, BSN, [place]   [phone]

## 2015-04-02 NOTE — Progress Notes (Addendum)
Pt noted to have increased WOB and SOB w/ HR increased to 120s (up from 80s-90s). MD Rama notified and RT called for neb treatment. New orders received. Will continue to monitor.   Dorrene German, RN

## 2015-04-02 NOTE — Evaluation (Signed)
Clinical/Bedside Swallow Evaluation Patient Details  Name: Beth Meyers MRN: [id number] Date of Birth: [birth year]  Today's Date: 04/02/2015 Time: SLP Start Time (ACUTE ONLY): 1016 SLP Stop Time (ACUTE ONLY): 1055 SLP Time Calculation (min) (ACUTE ONLY): 39 min  Past Medical History:  Past Medical History  Diagnosis Date  . COPD (chronic obstructive pulmonary disease)   . Hypertension   . Lumbago   . Depression   . Anxiety   . GERD (gastroesophageal reflux disease)    Past Surgical History: History reviewed. No pertinent past surgical history. HPI:  64 yo female adm to [facility] 03/28/15 with AMS/respiratory difficulties and alcohol w/d. She has been on a Precedex drip during hospital stay.  Pt has h/o COPD and anxiety.  Swallow evaluation ordered.  CXR and CT chest negative.     Assessment / Plan / Recommendation Clinical Impression  Pt presents with increased work of breathing and occasional cough with and without intake.  CN exam unremarkable.  Several breath groups noted while pt holding puree/liquids in mouth - concerning for aspiration BEFORE the swallow.  Pt with better tolerance of puree/honey and may be less risk due to increased viscocity. SLP does not suspect pt has pharyngeal residuals and largest aspiration risk factor appears to be due to respiratory status. Daughter Abigail Butts present and reports mom would occasionally cough with intake of foods more than liquids.  Using teach back, demonstration and clinical explanation , reviewed plan/aspiration precautions.  Pt is aspiration risk due to her work of breathing and strategies in place for mitigation.  Family reports pt has not recently had pna and eats small amounts at home.  Due to pt's impulsivity, full supervision is imperative with close monitoring of tolerance.     Aspiration Risk  Moderate    Diet Recommendation Dysphagia 1 (Puree);Honey (thin water between meals ok, ice ok)   Medication Administration: Crushed with  puree Compensations: Slow rate;Small sips/bites;Other (Comment)    Other  Recommendations Oral Care Recommendations: Oral care before and after PO Other Recommendations: Order thickener from pharmacy;Clarify dietary restrictions   Follow Up Recommendations       Frequency and Duration min 1 x/week  1 week   Pertinent Vitals/Pain Low grade fever, decreased      Swallow Study Prior Functional Status   see hhx    General Date of Onset: 04/02/15 Other Pertinent Information: 64 yo female adm to [facility] 03/28/15 with AMS/respiratory difficulties and alcohol w/d. She has been on a Precedex drip during hospital stay.  Pt has h/o COPD and anxiety.  Swallow evaluation ordered.  CXR and CT chest negative.   Type of Study: Bedside swallow evaluation Diet Prior to this Study: NPO Temperature Spikes Noted: Yes Respiratory Status: Supplemental O2 delivered via (comment) Behavior/Cognition: Alert;Fusing/Irritable;Distractible Oral Cavity - Dentition: Poor condition Self-Feeding Abilities: Able to feed self (pt is impulsive) Patient Positioning: Upright in bed Baseline Vocal Quality: Low vocal intensity;Breathy Volitional Cough: Weak Volitional Swallow: Unable to elicit (pt xerostomic)    Oral/Motor/Sensory Function Overall Oral Motor/Sensory Function: Other (comment) (no focal CN deficits)   Ice Chips Ice chips: Impaired Presentation: Spoon Oral Phase Impairments: Impaired anterior to posterior transit;Reduced lingual movement/coordination Oral Phase Functional Implications: Prolonged oral transit Pharyngeal Phase Impairments: Suspected delayed Swallow   Thin Liquid Thin Liquid: Impaired Presentation: Cup;Straw;Spoon Oral Phase Impairments: Reduced lingual movement/coordination;Impaired anterior to posterior transit Oral Phase Functional Implications: Prolonged oral transit;Oral holding Pharyngeal  Phase Impairments: Suspected delayed Swallow    Nectar Thick Nectar Thick Liquid:  Impaired Oral Phase Impairments: Impaired anterior to posterior transit;Reduced lingual movement/coordination Oral phase functional implications: Prolonged oral transit Pharyngeal Phase Impairments: Suspected delayed Swallow;Cough - Immediate   Honey Thick Honey Thick Liquid: Impaired Oral Phase Impairments: Impaired anterior to posterior transit;Reduced lingual movement/coordination Oral Phase Functional Implications: Prolonged oral transit Pharyngeal Phase Impairments: Suspected delayed Swallow   Puree Puree: Impaired Oral Phase Impairments: Reduced lingual movement/coordination;Impaired anterior to posterior transit Oral Phase Functional Implications: Prolonged oral transit Pharyngeal Phase Impairments: Suspected delayed Swallow   Solid   GO    Solid: Not tested Other Comments: did not attempt solids due to oral holding, delayed transiting -        Luanna Salk, [place] [facility] [place] [phone]

## 2015-04-02 NOTE — Progress Notes (Addendum)
Progress Note   Arlette Schaad Bertram JOI:[id number] DOB: [birth year] DOA: 03/28/2015 PCP: Lynne Logan, MD   Brief Narrative:   Beth Meyers is an 64 y.o. female with a PMH of COPD, hypertension, anxiety and depression was admitted 03/28/15 chief complaint of worsening dyspnea. CXR showed bronchitic changes. CT angio chest ruled out pulmonary embolism. 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 Psychiatry and neurology were subsequently consulted.  Assessment/Plan:   Principal Problem:  Acute encephalopathy / Alcohol withdrawal - CT head showed no acute intracranial findings. - UDS and alcohol level were negative on admission. - Attempted MRI brain09/11/2014 but patient was too restless to have MRI completed. - We stopped ativan per patient's daughter request as she wasconcerned that Ativan made patient's mental status worse.  - Neurology consultation performed 03/30/15. Symptoms not felt to be consistent with Wernicke's. B-12 WNL at 594. - Seen by psychiatry 03/30/15. Recommendations were to continue alcohol detox treatment and supportive therapy. - EEG negative for epileptiform activity. Ammonia not elevated. - More awake and alert today.  Weaning Precedex. - Speech therapy consult for swallowing evaluation still pending.  Active Problems:   Aspiration risk - Diet advanced to dysphagia I s/p ST evaluation 04/02/15.   COPD exacerbation / hypoxia - Patient received Solu-Medrol in ED 125 mg followed by 60 mg IV every 12 hours. - Because of the concern that steroids may have worsened mental status Solu-Medrol stopped 03/29/15. - Continue DuoNeb 3 times daily along with as needed nebulizers for shortness of breath or wheezing. - Continue oxygen support via nasal cannula to keep oxygen saturation above 90%. - Continue Symbicort.  - ABG done 03/31/15 and did not show hypercarbia.   Essential  hypertension - Continue hydralazine as needed. Resume Lasix & Micardis.  Continue Cozaar & clonidine patch.   Hypokalemia - We'll give 4 runs of potassium today.   Dyslipidemia - Fenofibrate on hold due to altered mental status.    Normocytic anemia -No current indications for transfusion. B-12 okay.   Anxiety and depression - On Precedex drip. Weaning. Takes Celexa at home.    DVT prophylaxis:  - SCD's bilaterally    Code Status: Full.  Family Communication: Daughter and patient's husband at the bedside 03/31/15, no family at bedside this a.m.  Disposition Plan: Continues to require SDU level of care.   IV Access:    Peripheral IV   Procedures and diagnostic studies:   Dg Chest 2 View  03/28/2015   CLINICAL DATA:  Shortness of breath and chest pain for 3 days. History of hypertension, asthma, COPD, emphysema.  EXAM: CHEST  2 VIEW  COMPARISON:  None.  FINDINGS: Heart size is normal. There is mild perihilar peribronchial thickening. Emphysematous changes are present with crowded markings at the bases. There are no focal consolidations or pleural effusions. No pulmonary edema.  Numerous old right rib fractures are present. There are osteoporotic type fractures of T6, T8, and T9.  IMPRESSION: 1. Bronchitic changes.  Emphysematous changes. 2.  No focal acute pulmonary abnormality. 3. Osteoporotic type fractures of the thoracic spine. Consider evaluation and treatment for osteoporosis as appropriate.   Electronically Signed   By: Nolon Nations M.D.   On: 03/28/2015 10:50   Ct Angio Chest Pe W/cm &/or Wo Cm  03/28/2015   CLINICAL DATA:  64 year old female with shortness of breath, altered mental status and history of COPD. Patient is on home oxygen.  EXAM:  CT ANGIOGRAPHY CHEST WITH CONTRAST  TECHNIQUE: Multidetector CT imaging of the chest was performed using the standard protocol during bolus administration of intravenous contrast. Multiplanar CT image reconstructions and MIPs  were obtained to evaluate the vascular anatomy.  CONTRAST:  149mL OMNIPAQUE IOHEXOL 350 MG/ML SOLN  COMPARISON:  Chest x-ray obtained earlier today  FINDINGS: Mediastinum: Unremarkable CT appearance of the thyroid gland. No suspicious mediastinal or hilar adenopathy. No soft tissue mediastinal mass. The thoracic esophagus is unremarkable.  Heart/Vascular: Adequate opacification of the pulmonary arteries to the proximal subsegmental level. Evaluation beyond the segmental arteries is limited secondary to significant respiratory motion. No evidence of central pulmonary embolus. The heart is within normal limits for size. There is no pericardial effusion. Evaluation for coronary artery calcium significantly limited secondary to patient motion. No evidence of aortic aneurysm. Trace atherosclerotic calcifications present at the origins of the great vessels.  Lungs/Pleura: Mild biapical pleural parenchymal scarring. Moderately advanced predominantly centrilobular pulmonary emphysema. Trace dependent atelectasis. No focal airspace consolidation, evidence of pulmonary edema or suspicious nodule or mass. Evaluation for small nodules is limited by respiratory motion.  Bones/Soft Tissues: Multiple remote healed right-sided rib fractures. No acute fracture or aggressive appearing lytic or blastic osseous lesion.  Upper Abdomen: Visualized upper abdominal organs are unremarkable.  Review of the MIP images confirms the above findings.  IMPRESSION: 1. Negative for evidence of acute central pulmonary embolus, pneumonia or other acute cardiopulmonary process. 2. Moderate image degradation secondary to significant respiratory and patient related motion artifact. 3. Moderately advanced centrilobular emphysema. 4. Multiple remote right-sided rib fractures noted incidentally.   Electronically Signed   By: Jacqulynn Cadet M.D.   On: 03/28/2015 13:50   Dg Chest Port 1 View  03/29/2015   CLINICAL DATA:  Pneumonia, short of breath,  unresponsive  EXAM: PORTABLE CHEST - 1 VIEW  COMPARISON:  Chest CT 03/28/2015, CT thorax 03/28/2015  FINDINGS: Normal cardiac silhouette. There is coarsened central bronchovascular markings similar prior. No focal infiltrate. No pneumothorax.  IMPRESSION: Chronic interstitial markings not changed from prior. No acute findings.   Electronically Signed   By: Suzy Bouchard M.D.   On: 03/29/2015 21:57     Medical Consultants:    Greta Doom, MD, Neurology  Ambrose Finland, MD  Anti-Infectives:    None.  Subjective:   Johanna A Nevils is more awake and alert. Dyspneic.  Denies pain.  Thirsty.  Objective:    Filed Vitals:   04/02/15 0300 04/02/15 0500 04/02/15 0600 04/02/15 0612  BP: 131/95 122/93 155/124 155/124  Pulse:      Temp: 98.8 F (37.1 C) 98.6 F (37 C) 98.4 F (36.9 C)   TempSrc:      Resp: 21 24 21    Height:      Weight:      SpO2: 100% 100% 100%     Intake/Output Summary (Last 24 hours) at 04/02/15 0708 Last data filed at 04/02/15 0600  Gross per 24 hour  Intake 906.67 ml  Output    535 ml  Net 371.67 ml   Filed Weights   03/28/15 1625 03/29/15 0500 04/01/15 0500  Weight: 82 kg (180 lb 12.4 oz) 80.7 kg (177 lb 14.6 oz) 81.1 kg (178 lb 12.7 oz)    Exam: Gen:  NAD,  Cardiovascular:  RRR, No M/R/G Respiratory:  Lungs with prolonged expiratory phase/faint wheeze Gastrointestinal:  Abdomen soft, NT/ND, + BS Extremities:  No C/E/C   Data Reviewed:    Labs: Basic Metabolic Panel:  Recent Labs Lab 03/28/15 1832 03/29/15 0342 03/30/15 0332 03/31/15 0237 04/01/15 0335 04/02/15 0350  NA 136 136 142 138 143 145  K 2.9* 3.4* 3.1* 3.0* 4.4 3.4*  CL 97* 98* 104 99* 107 113*  CO2 25 27 30 28 24  21*  GLUCOSE 153* 147* 108* 110* 106* 99  BUN 15 18 27* 26* 21* 17  CREATININE 0.84 0.76 0.79 0.70 0.67 0.60  CALCIUM 7.7* 8.1* 8.4* 8.8* 8.8* 8.8*  MG 2.1  --  2.2  --   --   --   PHOS 3.5  --   --   --   --   --    GFR Estimated  Creatinine Clearance: 69.8 mL/min (by C-G formula based on Cr of 0.6). Liver Function Tests:  Recent Labs Lab 03/28/15 1055 03/28/15 1832 03/29/15 0342  AST 36 44* 48*  ALT 16 18 21   ALKPHOS 70 60 63  BILITOT 0.5 0.8 0.5  PROT 7.1 6.1* 6.4*  ALBUMIN 3.4* 2.9* 3.1*    Recent Labs Lab 03/28/15 1056 03/28/15 1830 03/31/15 1126  AMMONIA SAMPLE TOO OLD, NOTIFIED AMBER AT 1600 03/28/15 HOOKEB 12 14    CBC:  Recent Labs Lab 03/28/15 1055 03/28/15 1832 03/29/15 0342 03/30/15 0332 03/31/15 0237  WBC 6.8 5.9 8.2 6.3 5.6  NEUTROABS 5.8 5.5  --   --   --   HGB 11.7* 10.4* 11.2* 9.8* 10.6*  HCT 36.9 31.8* 34.6* 30.3* 34.2*  MCV 90.4 89.8 90.3 91.5 92.4  PLT 357 280 281 238 216   CBG:  Recent Labs Lab 03/29/15 0730 03/30/15 0814 03/31/15 0840 04/01/15 0744  GLUCAP 124* 99 102* 103*   Anemia work up:  Recent Labs  03/30/15 1128  VITAMINB12 594   Sepsis Labs:  Recent Labs Lab 03/28/15 1113 03/28/15 1832 03/29/15 0342 03/30/15 0332 03/31/15 0237  WBC  --  5.9 8.2 6.3 5.6  LATICACIDVEN 2.12*  --   --   --   --    ABG    Component Value Date/Time   PHART 7.462* 03/31/2015 1041   PCO2ART 33.6* 03/31/2015 1041   PO2ART 67.1* 03/31/2015 1041   HCO3 23.7 03/31/2015 1041   TCO2 21.7 03/31/2015 1041   ACIDBASEDEF 1.9 03/28/2015 2225   O2SAT 91.6 03/31/2015 1041    Microbiology Recent Results (from the past 240 hour(s))  MRSA PCR Screening     Status: None   Collection Time: 03/28/15  4:00 PM  Result Value Ref Range Status   MRSA by PCR NEGATIVE NEGATIVE Final    Comment:        The GeneXpert MRSA Assay (FDA approved for NASAL specimens only), is one component of a comprehensive MRSA colonization surveillance program. It is not intended to diagnose MRSA infection nor to guide or monitor treatment for MRSA infections.      Medications:   . antiseptic oral rinse  7 mL Mouth Rinse BID  . antiseptic oral rinse  7 mL Mouth Rinse q12n4p  .  budesonide-formoterol  2 puff Inhalation BID  . chlorhexidine  15 mL Mouth Rinse BID  . cloNIDine  0.3 mg Transdermal Weekly  . folic acid  1 mg Intravenous Daily  . hydrALAZINE  10 mg Intravenous 3 times per day  . ipratropium-albuterol  3 mL Nebulization TID  . multivitamin with minerals  1 tablet Oral Daily  . pantoprazole (PROTONIX) IV  40 mg Intravenous Q24H  . sodium chloride  3 mL Intravenous Q12H  . thiamine  100  mg Oral Daily   Continuous Infusions: . sodium chloride 75 mL/hr at 04/02/15 0613  . dexmedetomidine 1.2 mcg/kg/hr (04/02/15 0610)    Time spent: 35 minutes.  The patient is medically complex and requires high complexity decision making. .   LOS: 5 days   [facility] Pager [phone]. If unable to reach me by pager, please call my cell phone at [phone].  *Please refer to [email or web address], password [email or web address] to get updated schedule on who will round on this patient, as hospitalists switch teams weekly. If 7PM-7AM, please contact night-coverage at [email or web address], password [email or web address] for any overnight needs.  04/02/2015, 7:08 AM

## 2015-04-02 NOTE — Progress Notes (Signed)
Clinical Social Work  Psych MD and CSW rounded on patient together at bedside. Family present and reports patient is more alert today. Patient having shortness of breath and psych team agreeable to follow up when patient is more stable.  [place], [place] [phone]

## 2015-04-03 ENCOUNTER — Inpatient Hospital Stay (HOSPITAL_COMMUNITY): Payer: Medicare Other

## 2015-04-03 DIAGNOSIS — J690 Pneumonitis due to inhalation of food and vomit: Secondary | ICD-10-CM | POA: Diagnosis not present

## 2015-04-03 LAB — BASIC METABOLIC PANEL
ANION GAP: 10 (ref 5–15)
BUN: 14 mg/dL (ref 6–20)
CALCIUM: 9.2 mg/dL (ref 8.9–10.3)
CO2: 23 mmol/L (ref 22–32)
CREATININE: 0.7 mg/dL (ref 0.44–1.00)
Chloride: 104 mmol/L (ref 101–111)
GLUCOSE: 142 mg/dL — AB (ref 65–99)
Potassium: 3.4 mmol/L — ABNORMAL LOW (ref 3.5–5.1)
Sodium: 137 mmol/L (ref 135–145)

## 2015-04-03 LAB — CORTISOL: Cortisol, Plasma: 15.6 ug/dL

## 2015-04-03 LAB — URINE MICROSCOPIC-ADD ON

## 2015-04-03 LAB — BLOOD GAS, ARTERIAL
Acid-base deficit: 8.9 mmol/L — ABNORMAL HIGH (ref 0.0–2.0)
BICARBONATE: 16.9 meq/L — AB (ref 20.0–24.0)
Drawn by: 307971
FIO2: 1
O2 Saturation: 99.1 %
PATIENT TEMPERATURE: 98.6
PCO2 ART: 37.9 mmHg (ref 35.0–45.0)
PEEP: 5 cmH2O
RATE: 16 resp/min
TCO2: 15.7 mmol/L (ref 0–100)
VT: 420 mL
pH, Arterial: 7.272 — ABNORMAL LOW (ref 7.350–7.450)
pO2, Arterial: 297 mmHg — ABNORMAL HIGH (ref 80.0–100.0)

## 2015-04-03 LAB — URINALYSIS, ROUTINE W REFLEX MICROSCOPIC
Glucose, UA: NEGATIVE mg/dL
Ketones, ur: NEGATIVE mg/dL
NITRITE: POSITIVE — AB
PH: 5 (ref 5.0–8.0)
Protein, ur: >300 mg/dL — AB
SPECIFIC GRAVITY, URINE: 1.034 — AB (ref 1.005–1.030)
Urobilinogen, UA: 1 mg/dL (ref 0.0–1.0)

## 2015-04-03 LAB — GLUCOSE, CAPILLARY
GLUCOSE-CAPILLARY: 137 mg/dL — AB (ref 65–99)
Glucose-Capillary: 135 mg/dL — ABNORMAL HIGH (ref 65–99)
Glucose-Capillary: 182 mg/dL — ABNORMAL HIGH (ref 65–99)

## 2015-04-03 LAB — MAGNESIUM: Magnesium: 1.5 mg/dL — ABNORMAL LOW (ref 1.7–2.4)

## 2015-04-03 LAB — BRAIN NATRIURETIC PEPTIDE: B NATRIURETIC PEPTIDE 5: 82.3 pg/mL (ref 0.0–100.0)

## 2015-04-03 MED ORDER — DEXMEDETOMIDINE HCL IN NACL 200 MCG/50ML IV SOLN
0.0000 ug/kg/h | INTRAVENOUS | Status: DC
Start: 2015-04-03 — End: 2015-04-05
  Administered 2015-04-03: 0.8 ug/kg/h via INTRAVENOUS
  Administered 2015-04-03: 0.4 ug/kg/h via INTRAVENOUS
  Administered 2015-04-03: 1 ug/kg/h via INTRAVENOUS
  Administered 2015-04-03: 0.7 ug/kg/h via INTRAVENOUS
  Administered 2015-04-04: 0.8 ug/kg/h via INTRAVENOUS
  Administered 2015-04-04: 1.2 ug/kg/h via INTRAVENOUS
  Administered 2015-04-04: 0.6 ug/kg/h via INTRAVENOUS
  Administered 2015-04-04: 0.9 ug/kg/h via INTRAVENOUS
  Administered 2015-04-04: 0.7 ug/kg/h via INTRAVENOUS
  Administered 2015-04-04: 0.6 ug/kg/h via INTRAVENOUS
  Administered 2015-04-04: 1 ug/kg/h via INTRAVENOUS
  Administered 2015-04-04: 0.9 ug/kg/h via INTRAVENOUS
  Administered 2015-04-05: 1.2 ug/kg/h via INTRAVENOUS
  Administered 2015-04-05 (×2): 0.8 ug/kg/h via INTRAVENOUS
  Administered 2015-04-05: 0.6 ug/kg/h via INTRAVENOUS
  Administered 2015-04-05: 0.8 ug/kg/h via INTRAVENOUS
  Filled 2015-04-03 (×18): qty 50

## 2015-04-03 MED ORDER — THIAMINE HCL 100 MG/ML IJ SOLN
100.0000 mg | Freq: Every day | INTRAMUSCULAR | Status: DC
  Administered 2015-04-03 – 2015-04-06 (×4): 100 mg via INTRAVENOUS
  Filled 2015-04-03 (×4): qty 2

## 2015-04-03 MED ORDER — SODIUM CHLORIDE 0.9 % IV BOLUS (SEPSIS)
750.0000 mL | Freq: Once | INTRAVENOUS | Status: AC
  Administered 2015-04-03: 750 mL via INTRAVENOUS

## 2015-04-03 MED ORDER — ACETAMINOPHEN 160 MG/5ML PO SOLN: 650.0000 mg | Freq: Four times a day (QID) | ORAL | Status: DC | PRN

## 2015-04-03 MED ORDER — SODIUM CHLORIDE 0.9 % IV SOLN
INTRAVENOUS | Status: DC
  Administered 2015-04-03 – 2015-04-05 (×4): via INTRAVENOUS

## 2015-04-03 MED ORDER — SODIUM CHLORIDE 0.9 % IV SOLN
500.0000 mg | Freq: Four times a day (QID) | INTRAVENOUS | Status: DC
  Administered 2015-04-03 – 2015-04-09 (×26): 500 mg via INTRAVENOUS
  Filled 2015-04-03 (×28): qty 500

## 2015-04-03 MED ORDER — SUCCINYLCHOLINE CHLORIDE 20 MG/ML IJ SOLN
INTRAMUSCULAR | Status: AC
  Filled 2015-04-03: qty 1

## 2015-04-03 MED ORDER — ETOMIDATE 2 MG/ML IV SOLN
INTRAVENOUS | Status: AC
  Administered 2015-04-03: 10 mg
  Filled 2015-04-03: qty 20

## 2015-04-03 MED ORDER — MIDAZOLAM HCL 2 MG/2ML IJ SOLN
INTRAMUSCULAR | Status: AC
Start: 2015-04-03 — End: 2015-04-03
  Administered 2015-04-03: 2 mg
  Filled 2015-04-03: qty 6

## 2015-04-03 MED ORDER — ANTISEPTIC ORAL RINSE SOLUTION (CORINZ)
7.0000 mL | OROMUCOSAL | Status: DC
  Administered 2015-04-03 – 2015-04-06 (×20): 7 mL via OROMUCOSAL

## 2015-04-03 MED ORDER — MIDAZOLAM HCL 2 MG/2ML IJ SOLN
1.0000 mg | Freq: Once | INTRAMUSCULAR | Status: AC
  Administered 2015-04-03: 1 mg via INTRAVENOUS

## 2015-04-03 MED ORDER — FENTANYL CITRATE (PF) 100 MCG/2ML IJ SOLN
INTRAMUSCULAR | Status: AC
  Administered 2015-04-03: 50 ug
  Filled 2015-04-03: qty 8

## 2015-04-03 MED ORDER — SODIUM CHLORIDE 0.9 % IV BOLUS (SEPSIS)
1000.0000 mL | Freq: Once | INTRAVENOUS | Status: AC
  Administered 2015-04-03: 1000 mL via INTRAVENOUS

## 2015-04-03 MED ORDER — MIDAZOLAM HCL 2 MG/2ML IJ SOLN
1.0000 mg | INTRAMUSCULAR | Status: DC | PRN
  Administered 2015-04-03 – 2015-04-05 (×6): 1 mg via INTRAVENOUS
  Filled 2015-04-03 (×5): qty 2

## 2015-04-03 MED ORDER — METHYLPREDNISOLONE SODIUM SUCC 40 MG IJ SOLR
40.0000 mg | Freq: Two times a day (BID) | INTRAMUSCULAR | Status: DC
  Administered 2015-04-03 – 2015-04-06 (×6): 40 mg via INTRAVENOUS
  Filled 2015-04-03 (×6): qty 1

## 2015-04-03 MED ORDER — IPRATROPIUM-ALBUTEROL 0.5-2.5 (3) MG/3ML IN SOLN
3.0000 mL | RESPIRATORY_TRACT | Status: DC | PRN
  Administered 2015-04-04 – 2015-04-08 (×2): 3 mL via RESPIRATORY_TRACT
  Filled 2015-04-03 (×2): qty 3

## 2015-04-03 MED ORDER — LEVOFLOXACIN IN D5W 750 MG/150ML IV SOLN
750.0000 mg | Freq: Every day | INTRAVENOUS | Status: DC
  Administered 2015-04-03: 750 mg via INTRAVENOUS
  Filled 2015-04-03: qty 150

## 2015-04-03 MED ORDER — VITAL HIGH PROTEIN PO LIQD
1000.0000 mL | ORAL | Status: DC
  Filled 2015-04-03: qty 1000

## 2015-04-03 MED ORDER — NOREPINEPHRINE BITARTRATE 1 MG/ML IV SOLN
0.0000 ug/min | INTRAVENOUS | Status: DC
  Administered 2015-04-03: 15 ug/min via INTRAVENOUS
  Administered 2015-04-03: 5 ug/min via INTRAVENOUS
  Administered 2015-04-04: 11 ug/min via INTRAVENOUS
  Administered 2015-04-04: 14 ug/min via INTRAVENOUS
  Administered 2015-04-04: 10 ug/min via INTRAVENOUS
  Administered 2015-04-04: 11 ug/min via INTRAVENOUS
  Filled 2015-04-03 (×7): qty 4

## 2015-04-03 MED ORDER — LIDOCAINE HCL (CARDIAC) 20 MG/ML IV SOLN
INTRAVENOUS | Status: AC
  Filled 2015-04-03: qty 5

## 2015-04-03 MED ORDER — VANCOMYCIN HCL IN DEXTROSE 1-5 GM/200ML-% IV SOLN
1000.0000 mg | Freq: Two times a day (BID) | INTRAVENOUS | Status: DC
  Administered 2015-04-03 – 2015-04-05 (×5): 1000 mg via INTRAVENOUS
  Filled 2015-04-03 (×4): qty 200

## 2015-04-03 MED ORDER — POTASSIUM CHLORIDE 10 MEQ/100ML IV SOLN
10.0000 meq | INTRAVENOUS | Status: AC
  Administered 2015-04-03 (×4): 10 meq via INTRAVENOUS
  Filled 2015-04-03 (×4): qty 100

## 2015-04-03 MED ORDER — METOCLOPRAMIDE HCL 5 MG/ML IJ SOLN
5.0000 mg | Freq: Four times a day (QID) | INTRAMUSCULAR | Status: DC
  Administered 2015-04-03 – 2015-04-04 (×4): 5 mg via INTRAVENOUS
  Filled 2015-04-03 (×4): qty 2

## 2015-04-03 MED ORDER — METHYLPREDNISOLONE SODIUM SUCC 40 MG IJ SOLR
40.0000 mg | Freq: Every day | INTRAMUSCULAR | Status: DC
  Administered 2015-04-03: 40 mg via INTRAVENOUS
  Filled 2015-04-03: qty 1

## 2015-04-03 MED ORDER — ROCURONIUM BROMIDE 50 MG/5ML IV SOLN
INTRAVENOUS | Status: AC
  Filled 2015-04-03: qty 2

## 2015-04-03 MED ORDER — IPRATROPIUM-ALBUTEROL 0.5-2.5 (3) MG/3ML IN SOLN
3.0000 mL | Freq: Four times a day (QID) | RESPIRATORY_TRACT | Status: DC
  Administered 2015-04-03 – 2015-04-06 (×12): 3 mL via RESPIRATORY_TRACT
  Filled 2015-04-03 (×12): qty 3

## 2015-04-03 MED ORDER — CHLORHEXIDINE GLUCONATE 0.12% ORAL RINSE (MEDLINE KIT)
15.0000 mL | Freq: Two times a day (BID) | OROMUCOSAL | Status: DC
  Administered 2015-04-03 – 2015-04-05 (×6): 15 mL via OROMUCOSAL

## 2015-04-03 MED ORDER — FENTANYL CITRATE (PF) 100 MCG/2ML IJ SOLN
50.0000 ug | INTRAMUSCULAR | Status: DC | PRN
  Administered 2015-04-03 – 2015-04-04 (×8): 50 ug via INTRAVENOUS
  Filled 2015-04-03 (×5): qty 2

## 2015-04-03 MED ORDER — VANCOMYCIN HCL IN DEXTROSE 1-5 GM/200ML-% IV SOLN
1000.0000 mg | Freq: Once | INTRAVENOUS | Status: AC
Start: 2015-04-03 — End: 2015-04-03
  Administered 2015-04-03: 1000 mg via INTRAVENOUS
  Filled 2015-04-03: qty 200

## 2015-04-03 NOTE — Progress Notes (Signed)
ANTIBIOTIC CONSULT NOTE - INITIAL  Pharmacy Consult for vancomcyin, imipenem Indication: rule out pneumonia  Allergies  Allergen Reactions  . Penicillins     REACTION: hives    Patient Measurements: Height: 5\' 3"  (160 cm) Weight: 188 lb 11.4 oz (85.6 kg) IBW/kg (Calculated) : 52.4 Adjusted Body Weight:   Vital Signs: Temp: 102.6 F (39.2 C) (09/10 1100) Temp Source: Core (Comment) (09/10 0800) BP: 176/121 mmHg (09/10 1100) Intake/Output from previous day: 09/09 0701 - 09/10 0700 In: 1844 [P.O.:240; I.V.:1204; IV Piggyback:400] Out: 900 [Urine:900] Intake/Output from this shift: Total I/O In: 387.5 [I.V.:37.5; IV Piggyback:350] Out: 0   Labs:  Recent Labs  04/01/15 0335 04/02/15 0350 04/03/15 0404  CREATININE 0.67 0.60 0.70   Estimated Creatinine Clearance: 71.7 mL/min (by C-G formula based on Cr of 0.7). No results for input(s): VANCOTROUGH, VANCOPEAK, VANCORANDOM, GENTTROUGH, GENTPEAK, GENTRANDOM, TOBRATROUGH, TOBRAPEAK, TOBRARND, AMIKACINPEAK, AMIKACINTROU, AMIKACIN in the last 72 hours.   Microbiology: Recent Results (from the past 720 hour(s))  MRSA PCR Screening     Status: None   Collection Time: 03/28/15  4:00 PM  Result Value Ref Range Status   MRSA by PCR NEGATIVE NEGATIVE Final    Comment:        The GeneXpert MRSA Assay (FDA approved for NASAL specimens only), is one component of a comprehensive MRSA colonization surveillance program. It is not intended to diagnose MRSA infection nor to guide or monitor treatment for MRSA infections.   Culture, blood (routine x 2)     Status: None (Preliminary result)   Collection Time: 04/03/15  6:41 AM  Result Value Ref Range Status   Specimen Description BLOOD RIGHT HAND  Final   Special Requests   Final    BOTTLES DRAWN AEROBIC ONLY 10CC Performed at [facility]    Culture PENDING  Incomplete   Report Status PENDING  Incomplete    Medical History: Past Medical History  Diagnosis Date  .  COPD (chronic obstructive pulmonary disease)   . Hypertension   . Lumbago   . Depression   . Anxiety   . GERD (gastroesophageal reflux disease)    Assessment: Beth Meyers presented 9/4 with dyspnea secondary to AECOPD.  Patient developed worsening fever and increased O2 demands, pharmacy asked to dose vancomycin and imipenem for PNA (no aspiration pneumonia seen on CXR 9/9).  PCN allergy = hives (no documented cephalosporin use)  9/10 >> vanco  >> 9/10 >> imipenem  >>    9/10 blood: 9/10 urine:   Renal: SCr WNL and stable (RN noted from 9/10 mentions decreasing UOP) WBC: last checked 9/7 + fevers  Dose changes/levels:   Goal of Therapy:  Vancomycin trough level 15-20 mcg/ml  Plan:   Vancomycin 1gm IV q12h  Monitor renal function and UOP  Vancomycin trough as indicated  Imipenem 500mg  IV q6h  Doreene Eland, PharmD, BCPS.   Pager: [phone]  04/03/2015,11:49 AM

## 2015-04-03 NOTE — Progress Notes (Signed)
NUTRITION NOTE  Brief Nutrition Note  Consult received for enteral/tube feeding initiation and management.  Adult Enteral Nutrition Protocol initiated. Full assessment to follow.  Admitting Dx: Wheezing [R06.2] Respiratory distress [R06.00] SOB (shortness of breath) [R06.02] Elevated lactic acid level [E87.2]  Body mass index is 33.44 kg/(m^2). Pt meets criteria for obesity based on current BMI.  Pt was intubated today and order received for RD to start TF. Per discussion with husband on 9/8, pt had not had anything to eat since AM of 9/4. Diet was advanced from NPO to Dysphagia 1 with nectar thick liquids yesterday and pt consumed 25% lunch and 50% dinner. She is likely still at risk for refeeding. Will initiate protocol and see pt for full assessment 9/11.  Labs:   Recent Labs Lab 03/28/15 1832  03/30/15 0332  04/01/15 0335 04/02/15 0350 04/03/15 0404  NA 136  < > 142  < > 143 145 137  K 2.9*  < > 3.1*  < > 4.4 3.4* 3.4*  CL 97*  < > 104  < > 107 113* 104  CO2 25  < > 30  < > 24 21* 23  BUN 15  < > 27*  < > 21* 17 14  CREATININE 0.84  < > 0.79  < > 0.67 0.60 0.70  CALCIUM 7.7*  < > 8.4*  < > 8.8* 8.8* 9.2  MG 2.1  --  2.2  --   --   --  1.5*  PHOS 3.5  --   --   --   --   --   --   GLUCOSE 153*  < > 108*  < > 106* 99 142*  < > = values in this interval not displayed.     Jarome Matin, RD, LDN Inpatient Clinical Dietitian Pager # [phone] After hours/weekend pager # [phone]

## 2015-04-03 NOTE — Procedures (Signed)
Intubation Procedure Note Beth Meyers [id number] 04-Jul-1951  Procedure: Intubation Indications: Respiratory insufficiency  Procedure Details Consent: Risks of procedure as well as the alternatives and risks of each were explained to the (patient/caregiver).  Consent for procedure obtained. Time Out: Verified patient identification, verified procedure, site/side was marked, verified correct patient position, special equipment/implants available, medications/allergies/relevent history reviewed, required imaging and test results available.  Performed  Maximum sterile technique was used including gloves and hand hygiene.  MAC and 3  Inserted #7.5 ETT to 22 cm at lip.  Evaluation Hemodynamic Status: Persistent hypotension treated with fluid; O2 sats: stable throughout Patient's Current Condition: stable Complications: No apparent complications Patient did tolerate procedure well. Chest X-ray ordered to verify placement.  CXR: pending.   Beth Mires, MD [facility] Pulmonary/Critical Care 04/03/2015, 12:25 PM Pager:  [phone] After 3pm call: [phone]

## 2015-04-03 NOTE — Progress Notes (Signed)
Pt.had only 120 mL of water at shift change. Pt.had episodes of nausea with vomiting during the shift. No po's given during the shift. Mid-level provider on call with [facility] was paged and notified. Pt.became febrile and HR increased to 130's sustained. Prn tylenol suppository given.  Mid-level provider on call with [facility] was paged and notified. New orders were written.

## 2015-04-03 NOTE — Progress Notes (Signed)
MD aware of increasing oxygen demands, fever, and low urine output.

## 2015-04-03 NOTE — Progress Notes (Signed)
Late entry: 13:00 gastric tube inserted per order. Brown, cloudy, malodorous liquid began pouring out of tube. OG hooked up to suction and cannister filled with 1000 mL liquid within minutes. MD made aware. Tube feeding order discontinued and OG tube set to low intermittent wall suction per MD. Will continue to monitor.

## 2015-04-03 NOTE — Procedures (Signed)
Central Venous Catheter Insertion Procedure Note TAMMI BOULIER [id number] Jan 03, 1951  Procedure: Insertion of Central Venous Catheter Indications: Drug and/or fluid administration  Procedure Details Consent: Risks of procedure as well as the alternatives and risks of each were explained to the (patient/caregiver).  Consent for procedure obtained. Time Out: Verified patient identification, verified procedure, site/side was marked, verified correct patient position, special equipment/implants available, medications/allergies/relevent history reviewed, required imaging and test results available.  Performed  Maximum sterile technique was used including antiseptics, cap, gloves, gown, hand hygiene, mask and sheet. Skin prep: Chlorhexidine; local anesthetic administered A antimicrobial bonded/coated triple lumen catheter was placed in the right internal jugular vein using the Seldinger technique.  Evaluation Blood flow good Complications: No apparent complications Patient did tolerate procedure well. Chest X-ray ordered to verify placement.  CXR: pending.  [name], MD [facility] Pulmonary/Critical Care 04/03/2015, 1:01 PM Pager:  [phone] After 3pm call: [phone]

## 2015-04-03 NOTE — Consult Note (Addendum)
PULMONARY / CRITICAL CARE MEDICINE   Name: Beth Meyers MRN: [id number] DOB: Apr 07, 1951    ADMISSION DATE:  03/28/2015 CONSULTATION DATE:  04/03/2015  REFERRING MD :  Dr. 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  STUDIES:  9/07 EEG >> triphasic waves  SIGNIFICANT EVENTS: 9/04 Admit, started precedex 9/06 Neurology/psychiatry consulted 9/10 Presumed aspiration, fever, VDRF   HISTORY OF PRESENT ILLNESS:   Hx from chart.   64 yo female with altered mental status, and dyspnea.  She was started on Tx for AECOPD and ETOH withdrawal.  She was seen by neurology, and concern was for Wernike's encephalopathy.  She was found to have dysphagia, and there was concern for aspiration.  She developed fever, hypoxia, chest congestion and weak cough.  PAST MEDICAL HISTORY :   has a past medical history of COPD (chronic obstructive pulmonary disease); Hypertension; Lumbago; Depression; Anxiety; and GERD (gastroesophageal reflux disease).  has no past surgical history on file. Prior to Admission medications   Medication Sig Start Date End Date Taking? Authorizing Provider  albuterol (PROVENTIL HFA;VENTOLIN HFA) 108 (90 BASE) MCG/ACT inhaler Inhale 2 puffs into the lungs every 6 (six) hours as needed for wheezing or shortness of breath.   Yes Historical Provider, MD  Aspirin-Salicylamide-Caffeine (BC HEADACHE POWDER PO) Take 1 Package by mouth 2 (two) times daily as needed (headache).   Yes Historical Provider, MD  Calcium Carbonate-Vitamin D (CALCIUM-VITAMIN D) 600-200 MG-UNIT CAPS Take 1 tablet by mouth daily.    Yes Historical Provider, MD  citalopram (CELEXA) 40 MG tablet Take 40 mg by mouth daily.     Yes Historical Provider, MD  famotidine-calcium carbonate-magnesium hydroxide (PEPCID COMPLETE) 10-800-165 MG CHEW chewable tablet Chew 1 tablet by mouth daily as  needed (heartburn).   Yes Historical Provider, MD  furosemide (LASIX) 40 MG tablet Take 40 mg by mouth 2 (two) times daily. 03/16/15  Yes Historical Provider, MD  ibuprofen (ADVIL) 200 MG tablet Take by mouth. Take as directed per bottle   Yes Historical Provider, MD  levalbuterol (XOPENEX) 1.25 MG/3ML nebulizer solution Take 1 ampule by nebulization every 4 (four) hours as needed for wheezing or shortness of breath.    Yes Historical Provider, MD  losartan (COZAAR) 100 MG tablet Take 100 mg by mouth daily.     Yes Historical Provider, MD  omeprazole (PRILOSEC) 20 MG capsule Take 20 mg by mouth daily.    Yes Historical Provider, MD  ondansetron (ZOFRAN-ODT) 8 MG disintegrating tablet Take 4-8 mg by mouth every 8 (eight) hours as needed for nausea.  03/11/15  Yes Historical Provider, MD  SYMBICORT 160-4.5 MCG/ACT inhaler INHALE 2 PUFFS INTO THE LUNGS TWICE A DAY 08/19/12  Yes Tammy S Parrett, NP  traZODone (DESYREL) 150 MG tablet Take 150 mg by mouth at bedtime.     Yes Historical Provider, MD  fenofibrate (TRICOR) 145 MG tablet Take 145 mg by mouth daily.      Historical Provider, MD  SPIRIVA HANDIHALER 18 MCG inhalation capsule INHALE THE CONTENTS OF 1 CAPSULE USING 2 INHALATIONS ONCE DAILY Patient not taking: Reported on 03/28/2015 02/05/11   Melvenia Needles, NP   Allergies  Allergen Reactions  . Penicillins     REACTION: hives    FAMILY HISTORY:  has no family status information on file.  SOCIAL HISTORY:  reports that she quit smoking about 22 years ago. Her  smoking use included Cigarettes. She has a 270 pack-year smoking history. She does not have any smokeless tobacco history on file. She reports that she drinks alcohol. She reports that she does not use illicit drugs.  REVIEW OF SYSTEMS:   Unable to obtain.  SUBJECTIVE:   VITAL SIGNS: Temp:  [97.8 F (36.6 C)-102.6 F (39.2 C)] 102.6 F (39.2 C) (09/10 1100) Resp:  [17-31] 30 (09/10 1100) BP: (87-176)/(40-121) 176/121 mmHg (09/10  1100) SpO2:  [92 %-100 %] 92 % (09/10 1100) Weight:  [188 lb 11.4 oz (85.6 kg)] 188 lb 11.4 oz (85.6 kg) (09/10 0500) HEMODYNAMICS:   VENTILATOR SETTINGS:   INTAKE / OUTPUT:  Intake/Output Summary (Last 24 hours) at 04/03/15 1155 Last data filed at 04/03/15 1146  Gross per 24 hour  Intake 1818.7 ml  Output    855 ml  Net  963.7 ml    PHYSICAL EXAMINATION: General: ill appearing Neuro:  Anxious, follows simple commands HEENT:  Dry mucosa Cardiovascular:  Regular, tachycardic Lungs:  B/l crackles Abdomen:  Soft, non tender Musculoskeletal:  No edema Skin:  No rashes  LABS:  CBC  Recent Labs Lab 03/29/15 0342 03/30/15 0332 03/31/15 0237  WBC 8.2 6.3 5.6  HGB 11.2* 9.8* 10.6*  HCT 34.6* 30.3* 34.2*  PLT 281 238 216   BMET  Recent Labs Lab 04/01/15 0335 04/02/15 0350 04/03/15 0404  NA 143 145 137  K 4.4 3.4* 3.4*  CL 107 113* 104  CO2 24 21* 23  BUN 21* 17 14  CREATININE 0.67 0.60 0.70  GLUCOSE 106* 99 142*   Electrolytes  Recent Labs Lab 03/28/15 1832  03/30/15 0332  04/01/15 0335 04/02/15 0350 04/03/15 0404  CALCIUM 7.7*  < > 8.4*  < > 8.8* 8.8* 9.2  MG 2.1  --  2.2  --   --   --  1.5*  PHOS 3.5  --   --   --   --   --   --   < > = values in this interval not displayed.   Sepsis Markers  Recent Labs Lab 03/28/15 1113  LATICACIDVEN 2.12*   ABG  Recent Labs Lab 03/28/15 2225 03/31/15 1041 04/02/15 1400  PHART 7.432 7.462* 7.360  PCO2ART 32.5* 33.6* 32.4*  PO2ART 103* 67.1* 79.5*   Liver Enzymes  Recent Labs Lab 03/28/15 1055 03/28/15 1832 03/29/15 0342  AST 36 44* 48*  ALT 16 18 21   ALKPHOS 70 60 63  BILITOT 0.5 0.8 0.5  ALBUMIN 3.4* 2.9* 3.1*   Glucose  Recent Labs Lab 03/29/15 0730 03/30/15 0814 03/31/15 0840 04/01/15 0744 04/02/15 0758 04/03/15 0745  GLUCAP 124* 99 102* 103* 84 137*    Imaging Dg Chest Port 1 View  04/03/2015   CLINICAL DATA:  Vomiting.  Possible aspiration.  EXAM: PORTABLE CHEST - 1  VIEW  COMPARISON:  None.  FINDINGS: Low lung volumes. Cardiomegaly. Nonspecific interstitial prominence favored to represent COPD/mild vascular congestion. Blunting LEFT CP angle. RIGHT rib fractures of indeterminate age, but favored to be chronic as there is no history of fall and chest wall pain. Thoracic atherosclerosis.  IMPRESSION: COPD. Small LEFT pleural effusion. Mild vascular congestion not excluded.  No aspiration pneumonia is observed.   Electronically Signed   By: Staci Righter M.D.   On: 04/03/2015 09:32     ASSESSMENT / PLAN:  PULMONARY ETT 9/10 >> A: Acute hypoxic respiratory failure 2nd to aspiration. AECOPD. P:   Full vent support Continue solumedrol Scheduled duonebs F/u CXR,  ABG  CARDIOVASCULAR CVL 9/10 >> A:  Sepsis. Hx of HTN. P:  IV fluids Place CVL Might need pressors  RENAL A:   Hypokalemia. P:   F/u BMET  GASTROINTESTINAL A:   Dysphagia. Nutrition. P:   Tube feeds while on vent Protonix for SUP  HEMATOLOGIC A:   No acute issues. P:  F/u CBC SQ heparin for DVT prevention  INFECTIOUS A:   Aspiration PNA. P:   Day of Abx 1 of vancomycin, primaxin  Blood 9/10 >> Urine 9/10 >> Sputum 9/10 >>  ENDOCRINE A:   Steroid induced hyperglycemia.   P:   SSI  NEUROLOGIC A:   Acute encephalopathy 2nd to ETOH withdrawal with concern for Wernicke's encephalopathy. P:   RASS goal -1  Updated pts family at bedside.  D/w Dr. Rockne Menghini.  CC time 50 minutes.  [name], MD [facility] Pulmonary/Critical Care 04/03/2015, 12:04 PM Pager:  [phone] After 3pm call: [phone]

## 2015-04-03 NOTE — Progress Notes (Addendum)
Progress Note   Beth Meyers BHA:[id number] DOB: 07/24/1951 DOA: 03/28/2015 PCP: Lynne Logan, MD   Brief Narrative:   Beth Meyers is an 64 y.o. female with a PMH of COPD, hypertension, anxiety and depression was admitted 03/28/15 chief complaint of worsening dyspnea. CXR showed bronchitic changes. CT angio chest ruled out pulmonary embolism. 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 Psychiatry and neurology were subsequently consulted.  Assessment/Plan:   Principal Problem:  Acute encephalopathy / Alcohol withdrawal - CT head showed no acute intracranial findings. UDS and alcohol level were negative on admission. - Unable to perform MRI secondary to patient's inability to comply/lie still. - Unable to detox with Ativan due to worsening mental status, so Precedex started. - Neurology consultation performed 03/30/15. Symptoms not felt to be consistent with Wernicke's. B-12 WNL at 594. - Seen by psychiatry 03/30/15. Recommendations were to continue alcohol detox treatment and supportive therapy. - EEG negative for epileptiform activity. Ammonia not elevated. - Mental status gradually improved.  Precedex weaned 04/02/15.  Active Problems:   Aspiration pneumonia with acute respiratory failure, increased WOB - Diet advanced to dysphagia I s/p ST evaluation 04/02/15.  Downgrade to NPO given N/V and probable aspiration event. - Respiratory status declining with increased RR, tachycardia and fevers overnight. - Blood cultures sent.  Start Vancomycin/Zosyn. F/U CXR. Check BNP. - PCCM consult requested.   COPD exacerbation / hypoxia - Patient received Solu-Medrol in ED 125 mg followed by 60 mg IV every 12 hours. - Because of the concern that steroids may have worsened mental status Solu-Medrol stopped 03/29/15. - Continue DuoNeb 3 times daily along with as needed nebulizers for shortness of breath  or wheezing. - Continue oxygen support via nasal cannula to keep oxygen saturation above 90%. - Continue Symbicort. 40 mg of prednisone daily started 04/02/15 secondary to increased work of breathing. - ABG done 03/31/15 and did not show hypercarbia. - Change Prednisone to Solumedrol 40 mg IV daily given inability to swallow safely.   Essential hypertension - Continue hydralazine & clonidine patch. D/C Cozaar and Lasix due to inability to safely swallow pills.   Hypokalemia - Replete. Check magnesium.   Dyslipidemia - Fenofibrate on hold.    Normocytic anemia -No current indications for transfusion. B-12 okay.   Anxiety and depression - Precedex weaned.  Celexa on hold.    DVT prophylaxis:  - SCD's bilaterally    Code Status: Full.  Family Communication: Daughter and patient's husband at the bedside.  Disposition Plan: Continues to require SDU level of care.   IV Access:    Peripheral IV   Procedures and diagnostic studies:   Dg Chest 2 View  03/28/2015   CLINICAL DATA:  Shortness of breath and chest pain for 3 days. History of hypertension, asthma, COPD, emphysema.  EXAM: CHEST  2 VIEW  COMPARISON:  None.  FINDINGS: Heart size is normal. There is mild perihilar peribronchial thickening. Emphysematous changes are present with crowded markings at the bases. There are no focal consolidations or pleural effusions. No pulmonary edema.  Numerous old right rib fractures are present. There are osteoporotic type fractures of T6, T8, and T9.  IMPRESSION: 1. Bronchitic changes.  Emphysematous changes. 2.  No focal acute pulmonary abnormality. 3. Osteoporotic type fractures of the thoracic spine. Consider evaluation and treatment for osteoporosis as appropriate.   Electronically Signed   By: Nolon Nations M.D.   On: 03/28/2015 10:50  Ct Angio Chest Pe W/cm &/or Wo Cm  03/28/2015   CLINICAL DATA:  64 year old female with shortness of breath, altered mental status and history of  COPD. Patient is on home oxygen.  EXAM: CT ANGIOGRAPHY CHEST WITH CONTRAST  TECHNIQUE: Multidetector CT imaging of the chest was performed using the standard protocol during bolus administration of intravenous contrast. Multiplanar CT image reconstructions and MIPs were obtained to evaluate the vascular anatomy.  CONTRAST:  113mL OMNIPAQUE IOHEXOL 350 MG/ML SOLN  COMPARISON:  Chest x-ray obtained earlier today  FINDINGS: Mediastinum: Unremarkable CT appearance of the thyroid gland. No suspicious mediastinal or hilar adenopathy. No soft tissue mediastinal mass. The thoracic esophagus is unremarkable.  Heart/Vascular: Adequate opacification of the pulmonary arteries to the proximal subsegmental level. Evaluation beyond the segmental arteries is limited secondary to significant respiratory motion. No evidence of central pulmonary embolus. The heart is within normal limits for size. There is no pericardial effusion. Evaluation for coronary artery calcium significantly limited secondary to patient motion. No evidence of aortic aneurysm. Trace atherosclerotic calcifications present at the origins of the great vessels.  Lungs/Pleura: Mild biapical pleural parenchymal scarring. Moderately advanced predominantly centrilobular pulmonary emphysema. Trace dependent atelectasis. No focal airspace consolidation, evidence of pulmonary edema or suspicious nodule or mass. Evaluation for small nodules is limited by respiratory motion.  Bones/Soft Tissues: Multiple remote healed right-sided rib fractures. No acute fracture or aggressive appearing lytic or blastic osseous lesion.  Upper Abdomen: Visualized upper abdominal organs are unremarkable.  Review of the MIP images confirms the above findings.  IMPRESSION: 1. Negative for evidence of acute central pulmonary embolus, pneumonia or other acute cardiopulmonary process. 2. Moderate image degradation secondary to significant respiratory and patient related motion artifact. 3.  Moderately advanced centrilobular emphysema. 4. Multiple remote right-sided rib fractures noted incidentally.   Electronically Signed   By: Jacqulynn Cadet M.D.   On: 03/28/2015 13:50   Dg Chest Port 1 View  03/29/2015   CLINICAL DATA:  Pneumonia, short of breath, unresponsive  EXAM: PORTABLE CHEST - 1 VIEW  COMPARISON:  Chest CT 03/28/2015, CT thorax 03/28/2015  FINDINGS: Normal cardiac silhouette. There is coarsened central bronchovascular markings similar prior. No focal infiltrate. No pneumothorax.  IMPRESSION: Chronic interstitial markings not changed from prior. No acute findings.   Electronically Signed   By: Suzy Bouchard M.D.   On: 03/29/2015 21:57    Medical Consultants:    Greta Doom, MD, Neurology  Ambrose Finland, MD  Anti-Infectives:    None.  Subjective:   Beth Meyers is more awake and alert. Dyspneic with increased RR and work of breathing noted.  RN reports that she vomited last night, and has been coughing up green material.  Patient cannot tell me if the material is from her stomach or lungs.  Objective:    Filed Vitals:   04/03/15 0400 04/03/15 0500 04/03/15 0513 04/03/15 0819  BP: 117/75  92/64   Pulse:      Temp: 100.8 F (38.2 C)     TempSrc:      Resp: 21     Height:      Weight:  85.6 kg (188 lb 11.4 oz)    SpO2: 98%   96%    Intake/Output Summary (Last 24 hours) at 04/03/15 0838 Last data filed at 04/03/15 0700  Gross per 24 hour  Intake 1683.51 ml  Output    900 ml  Net 783.51 ml   Filed Weights   03/29/15 0500 04/01/15 0500 04/03/15  0500  Weight: 80.7 kg (177 lb 14.6 oz) 81.1 kg (178 lb 12.7 oz) 85.6 kg (188 lb 11.4 oz)    Exam: Gen:  NAD Cardiovascular:  Tachy, No M/R/G Respiratory:  Lungs diminished with bilateral rhonchi Gastrointestinal:  Abdomen softly distended, NT, + BS Extremities:  No C/E/C   Data Reviewed:    Labs: Basic Metabolic Panel:  Recent Labs Lab 03/28/15 1832  03/30/15 0332  03/31/15 0237 04/01/15 0335 04/02/15 0350 04/03/15 0404  NA 136  < > 142 138 143 145 137  K 2.9*  < > 3.1* 3.0* 4.4 3.4* 3.4*  CL 97*  < > 104 99* 107 113* 104  CO2 25  < > 30 28 24  21* 23  GLUCOSE 153*  < > 108* 110* 106* 99 142*  BUN 15  < > 27* 26* 21* 17 14  CREATININE 0.84  < > 0.79 0.70 0.67 0.60 0.70  CALCIUM 7.7*  < > 8.4* 8.8* 8.8* 8.8* 9.2  MG 2.1  --  2.2  --   --   --   --   PHOS 3.5  --   --   --   --   --   --   < > = values in this interval not displayed. GFR Estimated Creatinine Clearance: 71.7 mL/min (by C-G formula based on Cr of 0.7). Liver Function Tests:  Recent Labs Lab 03/28/15 1055 03/28/15 1832 03/29/15 0342  AST 36 44* 48*  ALT 16 18 21   ALKPHOS 70 60 63  BILITOT 0.5 0.8 0.5  PROT 7.1 6.1* 6.4*  ALBUMIN 3.4* 2.9* 3.1*    Recent Labs Lab 03/28/15 1056 03/28/15 1830 03/31/15 1126  AMMONIA SAMPLE TOO OLD, NOTIFIED AMBER AT 1600 03/28/15 HOOKEB 12 14    CBC:  Recent Labs Lab 03/28/15 1055 03/28/15 1832 03/29/15 0342 03/30/15 0332 03/31/15 0237  WBC 6.8 5.9 8.2 6.3 5.6  NEUTROABS 5.8 5.5  --   --   --   HGB 11.7* 10.4* 11.2* 9.8* 10.6*  HCT 36.9 31.8* 34.6* 30.3* 34.2*  MCV 90.4 89.8 90.3 91.5 92.4  PLT 357 280 281 238 216   CBG:  Recent Labs Lab 03/29/15 0730 03/30/15 0814 03/31/15 0840 04/01/15 0744 04/02/15 0758  GLUCAP 124* 99 102* 103* 84   Sepsis Labs:  Recent Labs Lab 03/28/15 1113 03/28/15 1832 03/29/15 0342 03/30/15 0332 03/31/15 0237  WBC  --  5.9 8.2 6.3 5.6  LATICACIDVEN 2.12*  --   --   --   --    ABG    Component Value Date/Time   PHART 7.360 04/02/2015 1400   PCO2ART 32.4* 04/02/2015 1400   PO2ART 79.5* 04/02/2015 1400   HCO3 17.9* 04/02/2015 1400   TCO2 16.4 04/02/2015 1400   ACIDBASEDEF 6.2* 04/02/2015 1400   O2SAT 93.5 04/02/2015 1400    Microbiology Recent Results (from the past 240 hour(s))  MRSA PCR Screening     Status: None   Collection Time: 03/28/15  4:00 PM  Result Value Ref  Range Status   MRSA by PCR NEGATIVE NEGATIVE Final    Comment:        The GeneXpert MRSA Assay (FDA approved for NASAL specimens only), is one component of a comprehensive MRSA colonization surveillance program. It is not intended to diagnose MRSA infection nor to guide or monitor treatment for MRSA infections.      Medications:   . antiseptic oral rinse  7 mL Mouth Rinse BID  . antiseptic oral rinse  7 mL Mouth Rinse q12n4p  . budesonide-formoterol  2 puff Inhalation BID  . chlorhexidine  15 mL Mouth Rinse BID  . cloNIDine  0.3 mg Transdermal Weekly  . folic acid  1 mg Intravenous Daily  . furosemide  40 mg Oral BID  . hydrALAZINE  10 mg Intravenous 3 times per day  . ipratropium-albuterol  3 mL Nebulization TID  . levofloxacin (LEVAQUIN) IV  750 mg Intravenous Q24H  . losartan  100 mg Oral Daily  . multivitamin with minerals  1 tablet Oral Daily  . pantoprazole (PROTONIX) IV  40 mg Intravenous Q24H  . predniSONE  40 mg Oral Q breakfast  . sodium chloride  3 mL Intravenous Q12H  . thiamine  100 mg Oral Daily   Continuous Infusions: . 0.9 % NaCl with KCl 20 mEq / L 75 mL/hr at 04/02/15 0750  . dexmedetomidine Stopped (04/02/15 1732)    Time spent: 35 minutes.  The patient is medically complex and requires high complexity decision making. .   LOS: 6 days   [facility] Pager [phone]. If unable to reach me by pager, please call my cell phone at [phone].  *Please refer to [email or web address], password [email or web address] to get updated schedule on who will round on this patient, as hospitalists switch teams weekly. If 7PM-7AM, please contact night-coverage at [email or web address], password [email or web address] for any overnight needs.  04/03/2015, 8:38 AM

## 2015-04-04 ENCOUNTER — Inpatient Hospital Stay (HOSPITAL_COMMUNITY): Payer: Medicare Other

## 2015-04-04 LAB — CBC
HCT: 36 % (ref 36.0–46.0)
Hemoglobin: 11.5 g/dL — ABNORMAL LOW (ref 12.0–15.0)
MCH: 29.3 pg (ref 26.0–34.0)
MCHC: 31.9 g/dL (ref 30.0–36.0)
MCV: 91.6 fL (ref 78.0–100.0)
PLATELETS: 239 10*3/uL (ref 150–400)
RBC: 3.93 MIL/uL (ref 3.87–5.11)
RDW: 15.9 % — ABNORMAL HIGH (ref 11.5–15.5)
WBC: 13.1 10*3/uL — AB (ref 4.0–10.5)

## 2015-04-04 LAB — BASIC METABOLIC PANEL
Anion gap: 7 (ref 5–15)
BUN: 18 mg/dL (ref 6–20)
CALCIUM: 7.9 mg/dL — AB (ref 8.9–10.3)
CHLORIDE: 108 mmol/L (ref 101–111)
CO2: 20 mmol/L — ABNORMAL LOW (ref 22–32)
CREATININE: 0.85 mg/dL (ref 0.44–1.00)
GFR calc non Af Amer: >60 mL/min (ref >60)
Glucose, Bld: 201 mg/dL — ABNORMAL HIGH (ref 65–99)
Potassium: 3.9 mmol/L (ref 3.5–5.1)
SODIUM: 135 mmol/L (ref 135–145)

## 2015-04-04 LAB — BLOOD GAS, ARTERIAL
Acid-base deficit: 7.4 mmol/L — ABNORMAL HIGH (ref 0.0–2.0)
BICARBONATE: 18 meq/L — AB (ref 20.0–24.0)
Drawn by: 235321
FIO2: 0.3
LHR: 16 {breaths}/min
O2 Saturation: 94.6 %
PEEP: 5 cmH2O
Patient temperature: 36.7
TCO2: 16.7 mmol/L (ref 0–100)
VT: 420 mL
pCO2 arterial: 37.3 mmHg (ref 35.0–45.0)
pH, Arterial: 7.302 — ABNORMAL LOW (ref 7.350–7.450)
pO2, Arterial: 83.8 mmHg (ref 80.0–100.0)

## 2015-04-04 LAB — GLUCOSE, CAPILLARY
GLUCOSE-CAPILLARY: 145 mg/dL — AB (ref 65–99)
GLUCOSE-CAPILLARY: 150 mg/dL — AB (ref 65–99)
Glucose-Capillary: 137 mg/dL — ABNORMAL HIGH (ref 65–99)
Glucose-Capillary: 181 mg/dL — ABNORMAL HIGH (ref 65–99)
Glucose-Capillary: 193 mg/dL — ABNORMAL HIGH (ref 65–99)
Glucose-Capillary: 213 mg/dL — ABNORMAL HIGH (ref 65–99)

## 2015-04-04 LAB — MAGNESIUM
MAGNESIUM: 1.3 mg/dL — AB (ref 1.7–2.4)
MAGNESIUM: 2.5 mg/dL — AB (ref 1.7–2.4)

## 2015-04-04 MED ORDER — FENTANYL BOLUS VIA INFUSION
25.0000 ug | INTRAVENOUS | Status: DC | PRN
  Filled 2015-04-04: qty 25

## 2015-04-04 MED ORDER — IOHEXOL 300 MG/ML  SOLN
100.0000 mL | Freq: Once | INTRAMUSCULAR | Status: AC | PRN
  Administered 2015-04-04: 100 mL via INTRAVENOUS

## 2015-04-04 MED ORDER — IOHEXOL 300 MG/ML  SOLN
25.0000 mL | Freq: Once | INTRAMUSCULAR | Status: AC | PRN
  Administered 2015-04-04: 25 mL via ORAL

## 2015-04-04 MED ORDER — ACETAMINOPHEN 160 MG/5ML PO SOLN
650.0000 mg | Freq: Four times a day (QID) | ORAL | Status: DC | PRN
  Administered 2015-04-08: 650 mg via ORAL
  Filled 2015-04-04: qty 20.3

## 2015-04-04 MED ORDER — CHLORHEXIDINE GLUCONATE 0.12 % MT SOLN
OROMUCOSAL | Status: AC
  Filled 2015-04-04: qty 15

## 2015-04-04 MED ORDER — FENTANYL CITRATE (PF) 100 MCG/2ML IJ SOLN
50.0000 ug | Freq: Once | INTRAMUSCULAR | Status: AC
Start: 2015-04-04 — End: 2015-04-04
  Administered 2015-04-04: 50 ug via INTRAVENOUS

## 2015-04-04 MED ORDER — SODIUM CHLORIDE 0.9 % IV SOLN
6.0000 g | Freq: Once | INTRAVENOUS | Status: AC
  Administered 2015-04-04: 6 g via INTRAVENOUS
  Filled 2015-04-04: qty 12

## 2015-04-04 MED ORDER — INSULIN ASPART 100 UNIT/ML ~~LOC~~ SOLN
0.0000 [IU] | SUBCUTANEOUS | Status: DC
Start: 2015-04-04 — End: 2015-04-06
  Administered 2015-04-04: 2 [IU] via SUBCUTANEOUS
  Administered 2015-04-04: 1 [IU] via SUBCUTANEOUS
  Administered 2015-04-04: 3 [IU] via SUBCUTANEOUS
  Administered 2015-04-04 (×2): 1 [IU] via SUBCUTANEOUS
  Administered 2015-04-04: 2 [IU] via SUBCUTANEOUS
  Administered 2015-04-05 (×3): 1 [IU] via SUBCUTANEOUS

## 2015-04-04 MED ORDER — SODIUM CHLORIDE 0.9 % IV SOLN
25.0000 ug/h | INTRAVENOUS | Status: DC
  Administered 2015-04-04: 25 ug/h via INTRAVENOUS
  Administered 2015-04-04: 50 ug/h via INTRAVENOUS
  Administered 2015-04-04: 100 ug/h via INTRAVENOUS
  Filled 2015-04-04 (×4): qty 50

## 2015-04-04 NOTE — Progress Notes (Signed)
eLink Physician-Brief Progress Note Patient Name: Beth Meyers DOB: 02/22/1951 MRN: [id number]   Date of Service  04/04/2015  HPI/Events of Note  Blood glucose = 213. Patient is on Solumedrol.  eICU Interventions  Will check blood glucose Q 4 hours and cover with sensitive Novolog SSI.     Intervention Category Intermediate Interventions: Hyperglycemia - evaluation and treatment  Sommer,Steven Eugene 04/04/2015, 1:10 AM

## 2015-04-04 NOTE — Progress Notes (Signed)
Patient transported to CT without incidence on Ventilator with 100% O2.Patient manually bagged with 100% O2 while switching from wall O2 source to E tank O2.

## 2015-04-04 NOTE — Progress Notes (Signed)
Nutrition Follow-up  DOCUMENTATION CODES:   Obesity unspecified  INTERVENTION:   Initiate Vital HP @ 10 ml/hr via OGT and increase by 10 ml every 8 hours to goal rate of 40 ml/hr.   30 ml Prostat BID.    Tube feeding regimen provides 1160 kcal (73% of needs), 114 grams of protein, and 803 ml of H2O.   Recommend monitor magnesium, potassium, and phosphorus daily for at least 3 days, MD to replete as needed, as pt is at risk for refeeding syndrome given intake PTA and tolerance of TF.  RD to continue to monitor  NUTRITION DIAGNOSIS:   Inadequate oral intake related to inability to eat as evidenced by NPO status.  GOAL:   Patient will meet greater than or equal to 90% of their needs  MONITOR:   Diet advancement, Weight trends, Labs, I & O's, Other (Comment)    ASSESSMENT:   64 year old female with past medical history of COPD, anxiety and depression, hypertension who presented to [place] ED with worsening shortness of breath over past few days prior to this admission. She tried home inhaler and nebulizer treatment but without significant symptomatic relief. She had associated anxiety but no chest pain, no palpitations, No cough or fevers, No abdominal pain, nausea or vomiting. She was given solumedrol and nebulizer treatment by EMS and this seemed to help out a little.   Pt intubated 9/10. No family present in room during RD visit. Pt with poor PO intake PTA, monitor for refeeding risk. TF was started 9/10, pt did not tolerate. TF was stopped. Recommendations provided above for when TF is re-initiated. Recommend slower advancement.  Patient is currently intubated on ventilator support MV: 7.6 L/min Temp (24hrs), Avg:99.9 F (37.7 C), Min:97.5 F (36.4 C), Max:102 F (38.9 C)  Propofol: none  Labs reviewed: CBGs: 145-213 Low Mg  Diet Order:  Diet NPO time specified  Skin:  Reviewed, no issues  Last BM:  PTA  Height:   Ht Readings from Last 1 Encounters:  03/28/15  5\' 3"  (1.6 m)    Weight:   Wt Readings from Last 1 Encounters:  04/04/15 190 lb 11.2 oz (86.5 kg)    Ideal Body Weight:  52.27 kg (kg)  BMI:  Body mass index is 33.79 kg/(m^2).  Estimated Nutritional Needs:   Kcal:  503-5465  Protein:  100-110g  Fluid:  2L/day  EDUCATION NEEDS:   No education needs identified at this time  Clayton Bibles, MS, RD, LDN Pager: [phone] After Hours Pager: [phone]

## 2015-04-04 NOTE — Progress Notes (Signed)
[facility] ADULT ICU REPLACEMENT PROTOCOL FOR AM LAB REPLACEMENT ONLY  The patient does apply for the [facility] Adult ICU Electrolyte Replacment Protocol based on the criteria listed below:   1. Is GFR >/= 40 ml/min? Yes.    Patient's GFR today is >60 2. Is urine output >/= 0.5 ml/kg/hr for the last 6 hours? Yes.   Patient's UOP is 0.9 ml/kg/hr 3. Is BUN < 60 mg/dL? Yes.    Patient's BUN today is 18 4. Abnormal electrolyte(s): Mag 1.3 5. Ordered repletion with: per protocol 6. If a panic level lab has been reported, has the CCM MD in charge been notified? No..   Physician:    Ronda Fairly A 04/04/2015 6:50 AM

## 2015-04-04 NOTE — Progress Notes (Signed)
PULMONARY / CRITICAL CARE MEDICINE   Name: Beth Meyers MRN: [id number] DOB: 1951-05-17    ADMISSION DATE:  03/28/2015 CONSULTATION DATE:  04/03/2015  REFERRING MD :  Dr. 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  STUDIES:  9/07 EEG >> triphasic waves  SIGNIFICANT EVENTS: 9/04 Admit, started precedex 9/06 Neurology/psychiatry consulted 9/10 Presumed aspiration, fever, VDRF, pressors  SUBJECTIVE:  Remains on full vent support, pressors.  VITAL SIGNS: Temp:  [97.5 F (36.4 C)-102.6 F (39.2 C)] 99.1 F (37.3 C) (09/11 1030) Pulse Rate:  [98-118] 98 (09/10 1620) Resp:  [8-36] 12 (09/11 1030) BP: (51-176)/(28-121) 125/100 mmHg (09/11 1030) SpO2:  [91 %-100 %] 95 % (09/11 1030) FiO2 (%):  [30 %-100 %] 30 % (09/11 0836) Weight:  [190 lb 11.2 oz (86.5 kg)] 190 lb 11.2 oz (86.5 kg) (09/11 0448) HEMODYNAMICS: CVP:  [7 mmHg-16 mmHg] 16 mmHg VENTILATOR SETTINGS: Vent Mode:  [-] PRVC FiO2 (%):  [30 %-100 %] 30 % Set Rate:  [16 bmp] 16 bmp Vt Set:  [420 mL] 420 mL PEEP:  [5 cmH20] 5 cmH20 Plateau Pressure:  [15 cmH20-23 cmH20] 18 cmH20 INTAKE / OUTPUT:  Intake/Output Summary (Last 24 hours) at 04/04/15 1058 Last data filed at 04/04/15 1000  Gross per 24 hour  Intake 6049.87 ml  Output   3213 ml  Net 2836.87 ml    PHYSICAL EXAMINATION: General: sedated Neuro:  RASS -2 HEENT:  ETT Cardiovascular:  Regular Lungs: scattered rhonchi Abdomen:  Soft, non tender Musculoskeletal:  No edema Skin:  No rashes  LABS:  CBC  Recent Labs Lab 03/30/15 0332 03/31/15 0237 04/04/15 0450  WBC 6.3 5.6 13.1*  HGB 9.8* 10.6* 11.5*  HCT 30.3* 34.2* 36.0  PLT 238 216 239   BMET  Recent Labs Lab 04/02/15 0350 04/03/15 0404 04/04/15 0450  NA 145 137 135  K 3.4* 3.4* 3.9  CL 113* 104 108  CO2 21* 23 20*  BUN 17 14 18   CREATININE 0.60  0.70 0.85  GLUCOSE 99 142* 201*   Electrolytes  Recent Labs Lab 03/28/15 1832  03/30/15 0332  04/02/15 0350 04/03/15 0404 04/04/15 0450  CALCIUM 7.7*  < > 8.4*  < > 8.8* 9.2 7.9*  MG 2.1  --  2.2  --   --  1.5* 1.3*  PHOS 3.5  --   --   --   --   --   --   < > = values in this interval not displayed.   Sepsis Markers  Recent Labs Lab 03/28/15 1113  LATICACIDVEN 2.12*   ABG  Recent Labs Lab 04/02/15 1400 04/03/15 1410 04/04/15 0413  PHART 7.360 7.272* 7.302*  PCO2ART 32.4* 37.9 37.3  PO2ART 79.5* 297* 83.8   Liver Enzymes  Recent Labs Lab 03/28/15 1832 03/29/15 0342  AST 44* 48*  ALT 18 21  ALKPHOS 60 63  BILITOT 0.8 0.5  ALBUMIN 2.9* 3.1*   Glucose  Recent Labs Lab 04/03/15 0745 04/03/15 1512 04/03/15 2008 04/04/15 0027 04/04/15 0415 04/04/15 0816  GLUCAP 137* 135* 182* 213* 181* 145*    Imaging Dg Chest 1 View  04/03/2015   CLINICAL DATA:  64 year old female with a history respiratory failure. Congestion, fever.  EXAM: CHEST  1 VIEW  COMPARISON:  04/02/2015  FINDINGS: Cardiomediastinal silhouette unchanged. Atherosclerotic calcifications of the aortic arch.  Low lung volumes accentuates the interstitium.  Opacity  at the left base similar to the prior.  Interstitial opacities persist.  No confluent airspace disease.  No pneumothorax.  No displaced fracture.  Degenerative changes of the shoulders.  IMPRESSION: Similar appearance to the prior with chronic lung changes, and no evidence of lobar pneumonia. Opacity at the left base may represent atelectasis or scarring. Small left pleural effusion not excluded.  Signed,  Dulcy Fanny. Earleen Newport, DO  [facility]  [facility]   Electronically Signed   By: Corrie Mckusick D.O.   On: 04/03/2015 12:05   Dg Chest Port 1 View  04/04/2015   CLINICAL DATA:  Respiratory failure  EXAM: PORTABLE CHEST - 1 VIEW  COMPARISON:  04/03/2015  FINDINGS: Endotracheal tube tip is positioned  at the carina. Nasogastric tube tip terminates below the level of the hemidiaphragms but is not included in the field of view. Right IJ central line tip terminates over the upper SVC. Coarsened interstitial markings are reidentified bilaterally with overall low lung volumes but no focal acute finding otherwise. Remote right rib fractures reidentified.  IMPRESSION: Endotracheal tube at the level of the carina, consider pulling back 2 cm for more optimal positioning or repeat imaging to check recurrent position, given the interval since performance of the original exam and dictation of this report.  Stable nonspecific diffusely increased interstitial pattern.  These results were called by telephone at the time of interpretation on 04/04/2015 at 8:12 am to RN Shay for Dr. [name] , who verbally acknowledged these results.   Electronically Signed   By: Conchita Paris M.D.   On: 04/04/2015 08:23   Dg Chest Port 1 View  04/03/2015   CLINICAL DATA:  Endotracheal tube  EXAM: PORTABLE CHEST - 1 VIEW  COMPARISON:  04/02/2015  FINDINGS: New endotracheal tube with tip approximately 2 cm above the carina. An orogastric tube reaches the stomach. Right IJ central line, tip at the mid SVC level.  Stable blunting of the lateral left costophrenic sulcus. Interstitial coarsening and hyperinflation without change. Normal heart size and aortic contours. Remote right rib fractures. No acute osseous finding.  IMPRESSION: 1. New tubes and central line are in unremarkable position, as described above. No pneumothorax. 2. Trace left pleural effusion. 3. Background COPD.   Electronically Signed   By: Monte Fantasia M.D.   On: 04/03/2015 13:33   Dg Abd Portable 1v  04/03/2015   CLINICAL DATA:  Tube placement.  EXAM: PORTABLE ABDOMEN - 1 VIEW  COMPARISON:  None.  FINDINGS: An orogastric tube has been placed. The tip of the tube lies within or near the duodenum bulb. Distended loops of small bowel are noted in the mid abdomen  suggesting small bowel obstruction. Colonic gas is observed.  IMPRESSION: Satisfactory orogastric tube placement.   Electronically Signed   By: Staci Righter M.D.   On: 04/03/2015 13:33   I reviewed CXR 9/11 >> ETT looks positional, and she has b/l BS on exam; leave ETT at current position  ASSESSMENT / PLAN:  PULMONARY ETT 9/10 >> A: Acute hypoxic respiratory failure 2nd to aspiration. AECOPD. P:   Full vent support Continue solumedrol Scheduled duonebs F/u CXR  CARDIOVASCULAR Rt IJ CVL 9/10 >> A:  Septic shock. Hx of HTN. P:  F/u CVP Pressors to keep MAP > 65  RENAL A:   Hypokalemia, hypomagnesemia. P:   F/u BMET  GASTROINTESTINAL A:   Dysphagia. Nutrition. Increased gastric residual with ?of SBO on abd xray 9/10. P:   NPO  CT abd/pelvis 9/11 to assess for SBO OG tube to suction Protonix for SUP  HEMATOLOGIC A:   No acute issues. P:  F/u CBC SQ heparin for DVT prevention  INFECTIOUS A:   Aspiration PNA. P:   Day of Abx 3 of vancomycin, primaxin  Blood 9/10 >> Urine 9/10 >> Sputum 9/10 >>  ENDOCRINE A:   Steroid induced hyperglycemia.   P:   SSI  NEUROLOGIC A:   Acute encephalopathy 2nd to ETOH withdrawal with concern for Wernicke's encephalopathy. P:   RASS goal -1  Updated pts family at bedside.  CC time 32 minutes.  [name], MD [facility] Pulmonary/Critical Care 04/04/2015, 10:58 AM Pager:  [phone] After 3pm call: [phone]

## 2015-04-05 ENCOUNTER — Inpatient Hospital Stay (HOSPITAL_COMMUNITY): Payer: Medicare Other

## 2015-04-05 DIAGNOSIS — R410 Disorientation, unspecified: Secondary | ICD-10-CM

## 2015-04-05 LAB — GLUCOSE, CAPILLARY
GLUCOSE-CAPILLARY: 106 mg/dL — AB (ref 65–99)
GLUCOSE-CAPILLARY: 124 mg/dL — AB (ref 65–99)
GLUCOSE-CAPILLARY: 127 mg/dL — AB (ref 65–99)
GLUCOSE-CAPILLARY: 133 mg/dL — AB (ref 65–99)
Glucose-Capillary: 119 mg/dL — ABNORMAL HIGH (ref 65–99)
Glucose-Capillary: 146 mg/dL — ABNORMAL HIGH (ref 65–99)
Glucose-Capillary: 110 mg/dL — ABNORMAL HIGH (ref 65–99)
Glucose-Capillary: 130 mg/dL — ABNORMAL HIGH (ref 65–99)

## 2015-04-05 LAB — URINE CULTURE

## 2015-04-05 LAB — CBC
HCT: 32.9 % — ABNORMAL LOW (ref 36.0–46.0)
Hemoglobin: 10.6 g/dL — ABNORMAL LOW (ref 12.0–15.0)
MCH: 29.4 pg (ref 26.0–34.0)
MCHC: 32.2 g/dL (ref 30.0–36.0)
MCV: 91.1 fL (ref 78.0–100.0)
Platelets: 236 10*3/uL (ref 150–400)
RBC: 3.61 MIL/uL — ABNORMAL LOW (ref 3.87–5.11)
RDW: 16.1 % — AB (ref 11.5–15.5)
WBC: 10.3 10*3/uL (ref 4.0–10.5)

## 2015-04-05 LAB — BASIC METABOLIC PANEL
Anion gap: 6 (ref 5–15)
BUN: 15 mg/dL (ref 6–20)
CALCIUM: 8.1 mg/dL — AB (ref 8.9–10.3)
CO2: 22 mmol/L (ref 22–32)
CREATININE: 0.72 mg/dL (ref 0.44–1.00)
Chloride: 108 mmol/L (ref 101–111)
GFR calc Af Amer: >60 mL/min (ref >60)
GLUCOSE: 133 mg/dL — AB (ref 65–99)
Potassium: 3.9 mmol/L (ref 3.5–5.1)
Sodium: 136 mmol/L (ref 135–145)

## 2015-04-05 LAB — MAGNESIUM: MAGNESIUM: 2.2 mg/dL (ref 1.7–2.4)

## 2015-04-05 MED ORDER — KCL-LACTATED RINGERS-D5W 20 MEQ/L IV SOLN
INTRAVENOUS | Status: DC
  Administered 2015-04-05: 10:00:00 via INTRAVENOUS
  Filled 2015-04-05 (×2): qty 1000

## 2015-04-05 MED ORDER — VITAL HIGH PROTEIN PO LIQD
1000.0000 mL | ORAL | Status: DC
  Administered 2015-04-05: 1000 mL
  Filled 2015-04-05: qty 1000

## 2015-04-05 MED ORDER — PRO-STAT SUGAR FREE PO LIQD
30.0000 mL | Freq: Two times a day (BID) | ORAL | Status: DC
Start: 2015-04-05 — End: 2015-04-05
  Administered 2015-04-05: 30 mL via ORAL
  Filled 2015-04-05: qty 30

## 2015-04-05 MED ORDER — HEPARIN SODIUM (PORCINE) 5000 UNIT/ML IJ SOLN
5000.0000 [IU] | Freq: Three times a day (TID) | INTRAMUSCULAR | Status: DC
  Administered 2015-04-05 – 2015-04-09 (×12): 5000 [IU] via SUBCUTANEOUS
  Filled 2015-04-05 (×14): qty 1

## 2015-04-05 MED ORDER — TRAZODONE HCL 150 MG PO TABS
150.0000 mg | ORAL_TABLET | Freq: Every day | ORAL | Status: DC
  Administered 2015-04-05 – 2015-04-07 (×3): 150 mg via ORAL
  Filled 2015-04-05: qty 1
  Filled 2015-04-05: qty 3
  Filled 2015-04-05: qty 1
  Filled 2015-04-05: qty 3
  Filled 2015-04-05: qty 1

## 2015-04-05 NOTE — Progress Notes (Signed)
Pt. self extubated at 1515. Respiratory called. Nasal Cannula applied at 6 L/min. Sats 93-95%. Marni Griffon, NP notified and said to D/C Fentanyl and continue to monitor sats. Gildardo Cranker, RN

## 2015-04-05 NOTE — Progress Notes (Addendum)
ANTIBIOTIC CONSULT NOTE - Follow Up  Pharmacy Consult for vancomcyin, imipenem Indication: rule out pneumonia  Allergies  Allergen Reactions  . Penicillins     REACTION: hives    Patient Measurements: Height: 5\' 3"  (160 cm) Weight: 204 lb 5.9 oz (92.7 kg) IBW/kg (Calculated) : 52.4 Adjusted Body Weight:   Vital Signs: Temp: 99.9 F (37.7 C) (09/12 1200) Temp Source: Oral (09/12 1200) BP: 96/57 mmHg (09/12 1200) Pulse Rate: 97 (09/12 0800) Intake/Output from previous day: 09/11 0701 - 09/12 0700 In: 3531.6 [I.V.:2631.6; NG/GT:300; IV Piggyback:600] Out: 1335 [Urine:1335] Intake/Output from this shift: Total I/O In: 376.3 [I.V.:76.3; IV Piggyback:300] Out: 60 [Urine:60]  Labs:  Recent Labs  04/03/15 0404 04/04/15 0450 04/05/15 0350  WBC  --  13.1* 10.3  HGB  --  11.5* 10.6*  PLT  --  239 236  CREATININE 0.70 0.85 0.72   Estimated Creatinine Clearance: 74.8 mL/min (by C-G formula based on Cr of 0.72). No results for input(s): VANCOTROUGH, VANCOPEAK, VANCORANDOM, GENTTROUGH, GENTPEAK, GENTRANDOM, TOBRATROUGH, TOBRAPEAK, TOBRARND, AMIKACINPEAK, AMIKACINTROU, AMIKACIN in the last 72 hours.   Microbiology: Recent Results (from the past 720 hour(s))  MRSA PCR Screening     Status: None   Collection Time: 03/28/15  4:00 PM  Result Value Ref Range Status   MRSA by PCR NEGATIVE NEGATIVE Final    Comment:        The GeneXpert MRSA Assay (FDA approved for NASAL specimens only), is one component of a comprehensive MRSA colonization surveillance program. It is not intended to diagnose MRSA infection nor to guide or monitor treatment for MRSA infections.   Culture, blood (routine x 2)     Status: None (Preliminary result)   Collection Time: 04/03/15  6:26 AM  Result Value Ref Range Status   Specimen Description BLOOD RIGHT ARM  Final   Special Requests BOTTLES DRAWN AEROBIC AND ANAEROBIC 10CC  Final   Culture   Final    NO GROWTH 2 DAYS Performed at [facility]    Report Status PENDING  Incomplete  Culture, blood (routine x 2)     Status: None (Preliminary result)   Collection Time: 04/03/15  6:41 AM  Result Value Ref Range Status   Specimen Description BLOOD RIGHT HAND  Final   Special Requests BOTTLES DRAWN AEROBIC ONLY 10CC  Final   Culture   Final    NO GROWTH 2 DAYS Performed at [facility]    Report Status PENDING  Incomplete  Culture, respiratory (NON-Expectorated)     Status: None (Preliminary result)   Collection Time: 04/03/15  2:51 PM  Result Value Ref Range Status   Specimen Description TRACHEAL ASPIRATE  Final   Special Requests NONE  Final   Gram Stain PENDING  Incomplete   Culture   Final    Non-Pathogenic Oropharyngeal-type Flora Isolated. Performed at [facility]    Report Status PENDING  Incomplete  Culture, Urine     Status: None   Collection Time: 04/03/15  2:52 PM  Result Value Ref Range Status   Specimen Description URINE, CATHETERIZED  Final   Special Requests NONE  Final   Culture   Final    >=100,000 COLONIES/mL ESCHERICHIA COLI Performed at [facility]    Report Status 04/05/2015 FINAL  Final   Organism ID, Bacteria ESCHERICHIA COLI  Final      Susceptibility   Escherichia coli - MIC*    AMPICILLIN >=32 RESISTANT Resistant     CEFAZOLIN <=4  SENSITIVE Sensitive     CEFTRIAXONE <=1 SENSITIVE Sensitive     CIPROFLOXACIN >=4 RESISTANT Resistant     GENTAMICIN <=1 SENSITIVE Sensitive     IMIPENEM <=0.25 SENSITIVE Sensitive     NITROFURANTOIN <=16 SENSITIVE Sensitive     TRIMETH/SULFA <=20 SENSITIVE Sensitive     AMPICILLIN/SULBACTAM 16 INTERMEDIATE Intermediate     PIP/TAZO <=4 SENSITIVE Sensitive     * >=100,000 COLONIES/mL ESCHERICHIA COLI    Medical History: Past Medical History  Diagnosis Date  . COPD (chronic obstructive pulmonary disease)   . Hypertension   . Lumbago   . Depression   . Anxiety   . GERD (gastroesophageal reflux disease)    Assessment: 3  YOF presented 9/4 with dyspnea secondary to AECOPD.  Patient developed worsening fever and increased O2 demands, pharmacy asked to dose vancomycin and imipenem for PNA (no aspiration pneumonia seen on CXR 9/9).  Also, treating E.coli UTI.  PCN allergy = hives (no documented cephalosporin use)  9/10 >> vanco  >> 9/10 >> imipenem  >>    9/10 blood: ngtd 9/10 urine: E.coli (R-amp/cipro, I-unasyn, S-Ancef/CTX/Primaxin/Zosyn/Septra/Gent) 9/10 trach aspirate: nl flora  Today, 04/05/2015: Tmax 99.9 WBC normalized SCr stable, CrCl~77 ml/min (normalized)  Goal of Therapy:  Vancomycin trough level 15-20 mcg/ml  Plan:   Vancomycin 1gm IV q12h.  Check vancomycin trough tomorrow morning if continued.  Could consider narrowing therapy soon.  Continue Imipenem 500mg  IV q6h  Hershal Coria, PharmD, BCPS Pager: [phone] 04/05/2015 2:00 PM
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
## 2015-04-05 NOTE — Progress Notes (Signed)
eLink Physician-Brief Progress Note Patient Name: Beth Meyers DOB: [birth year] MRN: [id number]   Date of Service  04/05/2015  HPI/Events of Note  insom  eICU Interventions  Home traz     Intervention Category Minor Interventions: Routine modifications to care plan (e.g. PRN medications for pain, fever)  Beth Meyers. 04/05/2015, 7:44 PM

## 2015-04-05 NOTE — Progress Notes (Signed)
Nutrition Follow-up  DOCUMENTATION CODES:   Obesity unspecified  INTERVENTION:   Initiate trickle feeds of Vital HP @ 10 ml/hr.  30 ml Prostat BID Provides 440 kcal and 51g of protein.  When medically able, recommend advance by 10 ml every 8 hours to goal rate of 40 ml/hr.  Tube feeding + Prostat regimen provides 1160 kcal (70% of needs), 114 grams of protein, and 803 ml of H2O.   Recommend monitor magnesium, potassium, and phosphorus daily for at least 3 days, MD to replete as needed, as pt is at risk for refeeding syndrome given intake PTA and tolerance of TF.  RD to continue to monitor  NUTRITION DIAGNOSIS:   Inadequate oral intake related to inability to eat as evidenced by NPO status.  Ongoing.  GOAL:   Patient will meet greater than or equal to 90% of their needs  Not meeting.  MONITOR:   Vent status, Labs, Weight trends, TF tolerance, Skin, I & O's    ASSESSMENT:   64 year old female with past medical history of COPD, anxiety and depression, hypertension who presented to [facility] ED with worsening shortness of breath over past few days prior to this admission. She tried home inhaler and nebulizer treatment but without significant symptomatic relief. She had associated anxiety but no chest pain, no palpitations, No cough or fevers, No abdominal pain, nausea or vomiting. She was given solumedrol and nebulizer treatment by EMS and this seemed to help out a little.   Per MD note, pt to begin trickle feeds today. Recommendations provided above for advancement.  Patient is currently intubated on ventilator support MV: 10.4 L/min Temp (24hrs), Avg:99.3 F (37.4 C), Min:99 F (37.2 C), Max:100 F (37.8 C)  Propofol: none  Labs reviewed: CBGs: 110-124  Diet Order:  Diet NPO time specified  Skin:  Reviewed, no issues  Last BM:  9/10  Height:   Ht Readings from Last 1 Encounters:  03/28/15 5\' 3"  (1.6 m)    Weight:   Wt Readings from Last 1 Encounters:   04/05/15 204 lb 5.9 oz (92.7 kg)    Ideal Body Weight:  52.27 kg (kg)  BMI:  Body mass index is 36.21 kg/(m^2).  Estimated Nutritional Needs:   Kcal:  [phone]  Protein:  100-110g  Fluid:  2L/day  EDUCATION NEEDS:   No education needs identified at this time  Clayton Bibles, MS, RD, LDN Pager: [phone] After Hours Pager: [phone]

## 2015-04-05 NOTE — Progress Notes (Signed)
Date:  Sept. 12, 2016 U.R. performed for needs and level of care. Remains on iv pressors for bp support Will continue to follow for Case Management needs.  Velva Harman, RN, BSN, [place]   [phone]

## 2015-04-05 NOTE — Procedures (Signed)
Extubation Procedure Note  Patient Details:   Name: Beth Meyers DOB: Oct 27, 1950 MRN: [id number]   Airway Documentation:     Evaluation  O2 sats: 95 Complications: none Patient tolerated procedure fairly well. Bilateral Breath Sounds: diminished Suctioning: Oral, Airway pt able to speak  Pt self extubated, placed on 6L nasal cannula, titrated to 5L sats 95%.  Pt tolerating at this time.  Martha Clan 04/05/2015, 3:33 PM

## 2015-04-05 NOTE — Progress Notes (Signed)
Patient self-extubated. CSW unable to assess at this time. Will check back at a later time.   Lucius Conn, [place] Social Worker [place] [phone]

## 2015-04-05 NOTE — Progress Notes (Signed)
Pt. Husband informed of pt. Self extubation per in person visit at 1700. Gildardo Cranker, RN

## 2015-04-05 NOTE — Progress Notes (Addendum)
Wasted 50 mL of Fentanyl down sink with Jearld Lesch, RN  6:04 PM 04/05/2015 Gildardo Cranker, RN

## 2015-04-05 NOTE — Progress Notes (Signed)
PULMONARY / CRITICAL CARE MEDICINE   Name: Beth Meyers MRN: [id number] DOB: January 21, 1951    ADMISSION DATE:  03/28/2015 CONSULTATION DATE:  04/03/2015  REFERRING MD :  Dr. 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  STUDIES:  9/07 EEG >> triphasic waves 9/11: Persistent mild small bowel dilatation. No definitive transition zone is seen. No mass lesion is noted. Diverticulosis without definite diverticulitis. SIGNIFICANT EVENTS: 9/04 Admit, started precedex 9/06 Neurology/psychiatry consulted 9/10 Presumed aspiration, fever, VDRF, pressors 9/11 CT abd for high residuals-->ileus. 9/12 still pressor dependent but likely r/t sedation. Attempting weaning efforts. Resume trickle feeds.   SUBJECTIVE:  Agitated at times. Still on fentanyl and precedex gtts/also still on levophed.   VITAL SIGNS: Temp:  [97.5 F (36.4 C)-100 F (37.8 C)] 99.1 F (37.3 C) (09/12 0700) Resp:  [6-28] 16 (09/12 0700) BP: (87-164)/(42-100) 106/49 mmHg (09/12 0700) SpO2:  [83 %-100 %] 94 % (09/12 0740) FiO2 (%):  [30 %] 30 % (09/12 0740) Weight:  [92.7 kg (204 lb 5.9 oz)] 92.7 kg (204 lb 5.9 oz) (09/12 0400) HEMODYNAMICS: CVP:  [16 mmHg] 16 mmHg VENTILATOR SETTINGS: Vent Mode:  [-] PRVC FiO2 (%):  [30 %] 30 % Set Rate:  [16 bmp] 16 bmp Vt Set:  [420 mL-500 mL] 500 mL PEEP:  [5 cmH20] 5 cmH20 Plateau Pressure:  [18 cmH20-30 cmH20] 30 cmH20 INTAKE / OUTPUT:  Intake/Output Summary (Last 24 hours) at 04/05/15 2094 Last data filed at 04/05/15 0600  Gross per 24 hour  Intake 3446.59 ml  Output   1335 ml  Net 2111.59 ml    PHYSICAL EXAMINATION: General: sedated, opens eyes. Follows commands Neuro:  RASS -2; opens eyes, tries to communicate. Agitated at times.  HEENT:  ETT Cardiovascular:  Regular Lungs: scattered rhonchi Abdomen:  Soft, non  tender Musculoskeletal:  No edema Skin:  No rashes  LABS:  CBC  Recent Labs Lab 03/31/15 0237 04/04/15 0450 04/05/15 0350  WBC 5.6 13.1* 10.3  HGB 10.6* 11.5* 10.6*  HCT 34.2* 36.0 32.9*  PLT 216 239 236   BMET  Recent Labs Lab 04/03/15 0404 04/04/15 0450 04/05/15 0350  NA 137 135 136  K 3.4* 3.9 3.9  CL 104 108 108  CO2 23 20* 22  BUN 14 18 15   CREATININE 0.70 0.85 0.72  GLUCOSE 142* 201* 133*   Electrolytes  Recent Labs Lab 04/03/15 0404 04/04/15 0450 04/04/15 2015 04/05/15 0350  CALCIUM 9.2 7.9*  --  8.1*  MG 1.5* 1.3* 2.5* 2.2     Sepsis Markers No results for input(s): LATICACIDVEN, PROCALCITON, O2SATVEN in the last 168 hours. ABG  Recent Labs Lab 04/02/15 1400 04/03/15 1410 04/04/15 0413  PHART 7.360 7.272* 7.302*  PCO2ART 32.4* 37.9 37.3  PO2ART 79.5* 297* 83.8   Liver Enzymes No results for input(s): AST, ALT, ALKPHOS, BILITOT, ALBUMIN in the last 168 hours. Glucose  Recent Labs Lab 04/04/15 1703 04/04/15 1914 04/04/15 2001 04/04/15 2342 04/04/15 2359 04/05/15 0348  GLUCAP 137* 150* 133* 146* 124* 119*    Imaging Ct Abdomen Pelvis W Contrast  04/04/2015   CLINICAL DATA:  Small bowel dilatation on recent abdominal film  EXAM: CT ABDOMEN AND PELVIS WITH CONTRAST  TECHNIQUE: Multidetector CT imaging of the abdomen and pelvis was performed using the standard protocol following bolus administration of intravenous contrast.  CONTRAST:  127mL OMNIPAQUE IOHEXOL 300 MG/ML  SOLN  COMPARISON:  04/03/15  FINDINGS: Lung bases demonstrate mild bibasilar atelectasis. No focal confluent infiltrate is seen. No sizable effusion is noted.  The liver is diffusely decreased in attenuation consistent with fatty infiltration. Some dependent density is noted within the gallbladder likely related to small stones or gallbladder sludge. The spleen, adrenal glands and pancreas are within normal limits. A nasogastric catheter is noted within the third portion of  the duodenum. Kidneys are well visualized and within normal limits. The appendix is within normal limits  Mild ascites is noted predominately within the pelvis. The bladder is decompressed by Foley catheter. No pelvic mass lesion is seen. Mildly dilated small bowel is again identified although no definitive transition point is seen. Diffuse diverticular change is noted without definitive diverticulitis. Diffuse aortoiliac calcifications are noted without aneurysmal dilatation. Mild changes of anasarca are noted in the subcutaneous tissues.  IMPRESSION: Persistent mild small bowel dilatation. No definitive transition zone is seen. No mass lesion is noted.  Diverticulosis without definite diverticulitis.  Mild ascites and changes of anasarca in the subcutaneous tissues.   Electronically Signed   By: Inez Catalina M.D.   On: 04/04/2015 16:07   Dg Chest Port 1 View  04/05/2015   CLINICAL DATA:  Endotracheal tube, right IJ line, NG tube in stable position. Stable mild cardiomegaly.  EXAM: PORTABLE CHEST - 1 VIEW  COMPARISON:  04/04/2015.  04/03/2015.  04/02/2015.  FINDINGS: Endotracheal tube 1.7 cm above the carina. Proximal repositioning of approximately 1 cm should be considered. Right IJ line and NG tube in stable position. Stable mild cardiomegaly. Diffuse unchanged interstitial prominence noted. This could be related to interstitial edema or pneumonitis. Chronic interstitial lung disease cannot be excluded. Left base atelectasis. Small left pleural effusion. No pneumothorax.  IMPRESSION: 1. Endotracheal tube 1.7 cm above the carina. Proximal repositioning of approximately 1 cm suggested. 2. Stable mild cardiomegaly. 3. Diffuse unchanged mild interstitial prominence. These changes could be related to mild interstitial edema and/or pneumonitis. Chronic interstitial lung disease could also present this fashion. Left base atelectasis. Small left pleural effusion. These results will be called to the ordering clinician  or representative by the Radiologist Assistant, and communication documented in the PACS or zVision Dashboard.   Electronically Signed   By: Marcello Moores  Register   On: 04/05/2015 07:40   I reviewed CXR 9/12 >> increased L basilar ATX; otherwise no sig change   ASSESSMENT / PLAN:  PULMONARY ETT 9/10 >> A: Acute hypoxic respiratory failure 2nd to aspiration. AECOPD. P:   Full vent support Continue solumedrol Scheduled duonebs F/u CXR  CARDIOVASCULAR Rt IJ CVL 9/10 >> A:  Septic shock. Hx of HTN. P:  F/u CVP Pressors to keep MAP > 65  RENAL A:   Intermittent Hypokalemia, hypomagnesemia. P:   F/u BMET   GASTROINTESTINAL A:   Dysphagia. Ileus (no obstruction by CT scan) Ascites Protein calorie malnutrition  P:   NPO OG tube to suction Ensure nml K level  Protonix for SUP  HEMATOLOGIC A:   Anemia of critical illness P:  F/u CBC SQ heparin for DVT prevention  INFECTIOUS A:   Aspiration PNA. Ecoli UTI P:   Day of Abx 4 of vancomycin, primaxin  Blood 9/10 >> Urine 9/10: Ecoli>>> Sputum 9/10 >>  ENDOCRINE A:   Steroid induced hyperglycemia.   P:   SSI  NEUROLOGIC A:   Acute encephalopathy 2nd to ETOH withdrawal with concern for Wernicke's encephalopathy. P:   RASS goal -1 PAD protocol Thiamine and folate  replacement   Updated pts family at bedside.  Discussion Seems to be stable. BP requirements are likely being driven by sedation at this point. She did grow e coli in her urine but sensitivities are still pending. We will begin weaning efforts today. Await cultures and narrow as indicated. Family has been updated at length at the bedside.   Erick Colace ACNP-BC Index Pager # [phone] OR # [phone] if no answer

## 2015-04-06 ENCOUNTER — Inpatient Hospital Stay (HOSPITAL_COMMUNITY): Payer: Medicare Other

## 2015-04-06 LAB — CULTURE, RESPIRATORY W GRAM STAIN

## 2015-04-06 LAB — COMPREHENSIVE METABOLIC PANEL
ALK PHOS: 51 U/L (ref 38–126)
ALT: 19 U/L (ref 14–54)
AST: 16 U/L (ref 15–41)
Albumin: 2.5 g/dL — ABNORMAL LOW (ref 3.5–5.0)
Anion gap: 7 (ref 5–15)
BILIRUBIN TOTAL: 0.7 mg/dL (ref 0.3–1.2)
BUN: 13 mg/dL (ref 6–20)
CALCIUM: 8.8 mg/dL — AB (ref 8.9–10.3)
CO2: 24 mmol/L (ref 22–32)
Chloride: 106 mmol/L (ref 101–111)
Creatinine, Ser: 0.72 mg/dL (ref 0.44–1.00)
GFR calc Af Amer: >60 mL/min (ref >60)
Glucose, Bld: 96 mg/dL (ref 65–99)
POTASSIUM: 3.8 mmol/L (ref 3.5–5.1)
Sodium: 137 mmol/L (ref 135–145)
TOTAL PROTEIN: 5.5 g/dL — AB (ref 6.5–8.1)

## 2015-04-06 LAB — GLUCOSE, CAPILLARY
GLUCOSE-CAPILLARY: 74 mg/dL (ref 65–99)
GLUCOSE-CAPILLARY: 83 mg/dL (ref 65–99)
GLUCOSE-CAPILLARY: 85 mg/dL (ref 65–99)
GLUCOSE-CAPILLARY: 91 mg/dL (ref 65–99)
Glucose-Capillary: 82 mg/dL (ref 65–99)
Glucose-Capillary: 88 mg/dL (ref 65–99)

## 2015-04-06 LAB — CBC
HEMATOCRIT: 31.4 % — AB (ref 36.0–46.0)
HEMOGLOBIN: 10.3 g/dL — AB (ref 12.0–15.0)
MCH: 29.7 pg (ref 26.0–34.0)
MCHC: 32.8 g/dL (ref 30.0–36.0)
MCV: 90.5 fL (ref 78.0–100.0)
Platelets: 184 10*3/uL (ref 150–400)
RBC: 3.47 MIL/uL — ABNORMAL LOW (ref 3.87–5.11)
RDW: 15.8 % — AB (ref 11.5–15.5)
WBC: 8.8 10*3/uL (ref 4.0–10.5)

## 2015-04-06 LAB — CULTURE, RESPIRATORY

## 2015-04-06 LAB — VANCOMYCIN, TROUGH: Vancomycin Tr: 20 ug/mL (ref 10.0–20.0)

## 2015-04-06 MED ORDER — BOOST / RESOURCE BREEZE PO LIQD
1.0000 | Freq: Three times a day (TID) | ORAL | Status: DC
  Administered 2015-04-09: 1 via ORAL

## 2015-04-06 MED ORDER — TIOTROPIUM BROMIDE MONOHYDRATE 18 MCG IN CAPS
18.0000 ug | ORAL_CAPSULE | Freq: Every day | RESPIRATORY_TRACT | Status: DC
  Administered 2015-04-06 – 2015-04-09 (×3): 18 ug via RESPIRATORY_TRACT
  Filled 2015-04-06 (×2): qty 5

## 2015-04-06 MED ORDER — PROSIGHT PO TABS
1.0000 | ORAL_TABLET | Freq: Every day | ORAL | Status: DC
  Administered 2015-04-08 – 2015-04-09 (×2): 1 via ORAL
  Filled 2015-04-06 (×4): qty 1

## 2015-04-06 MED ORDER — PREDNISONE 20 MG PO TABS: 40.0000 mg | ORAL_TABLET | Freq: Every day | ORAL | Status: DC

## 2015-04-06 MED ORDER — VITAMIN B-1 100 MG PO TABS
100.0000 mg | ORAL_TABLET | Freq: Every day | ORAL | Status: DC
  Administered 2015-04-08 – 2015-04-09 (×2): 100 mg via ORAL
  Filled 2015-04-06 (×2): qty 1

## 2015-04-06 MED ORDER — ONDANSETRON HCL 4 MG/2ML IJ SOLN
4.0000 mg | Freq: Four times a day (QID) | INTRAMUSCULAR | Status: DC | PRN
  Administered 2015-04-06: 4 mg via INTRAVENOUS
  Filled 2015-04-06: qty 2

## 2015-04-06 MED ORDER — INSULIN ASPART 100 UNIT/ML ~~LOC~~ SOLN
0.0000 [IU] | Freq: Three times a day (TID) | SUBCUTANEOUS | Status: DC
  Administered 2015-04-09: 1 [IU] via SUBCUTANEOUS

## 2015-04-06 MED ORDER — CETYLPYRIDINIUM CHLORIDE 0.05 % MT LIQD
7.0000 mL | Freq: Two times a day (BID) | OROMUCOSAL | Status: DC
  Administered 2015-04-06: 7 mL via OROMUCOSAL

## 2015-04-06 MED ORDER — BUDESONIDE-FORMOTEROL FUMARATE 160-4.5 MCG/ACT IN AERO
2.0000 | INHALATION_SPRAY | Freq: Two times a day (BID) | RESPIRATORY_TRACT | Status: DC
  Administered 2015-04-06 – 2015-04-09 (×6): 2 via RESPIRATORY_TRACT
  Filled 2015-04-06 (×3): qty 6

## 2015-04-06 MED ORDER — FOLIC ACID 1 MG PO TABS
1.0000 mg | ORAL_TABLET | Freq: Every day | ORAL | Status: DC
  Administered 2015-04-08 – 2015-04-09 (×2): 1 mg via ORAL
  Filled 2015-04-06 (×2): qty 1

## 2015-04-06 NOTE — Progress Notes (Signed)
CCM made aware of episodes of vomiting. Order written for KUB. Will continue to monitor.

## 2015-04-06 NOTE — Progress Notes (Signed)
Nutrition Follow-up  DOCUMENTATION CODES:   Obesity unspecified  INTERVENTION:   -Provide Boost Breeze po TID, each supplement provides 250 kcal and 9 grams of protein -Once diet advanced past clears, provide Ensure Enlive po BID, each supplement provides 350 kcal and 20 grams of protein -Encourage PO intake -RD to continue to monitor  NUTRITION DIAGNOSIS:   Increased nutrient needs related to other (see comment) (History of ETOH use) as evidenced by estimated needs.  GOAL:   Patient will meet greater than or equal to 90% of their needs  Not meeting.  MONITOR:   PO intake, Supplement acceptance, Diet advancement, Labs, Weight trends, Skin, I & O's    ASSESSMENT:   64 year old female with past medical history of COPD, anxiety and depression, hypertension who presented to [facility] ED with worsening shortness of breath over past few days prior to this admission. She tried home inhaler and nebulizer treatment but without significant symptomatic relief. She had associated anxiety but no chest pain, no palpitations, No cough or fevers, No abdominal pain, nausea or vomiting. She was given solumedrol and nebulizer treatment by EMS and this seemed to help out a little.  Pt intubated from 9/10 - 9/12 per self-extubation.  Pt in room with family. Per family, pt would eat chicken sandwiches and crackers PTA. Pt was not eating that much. Pt is on clear liquids and per family she vomited this morning after eating.  Pt states she is swallowing with no problems.   Pt would like to try nutritional supplements. RD to order Boost Breeze while diet is clear liquid. Recommend Ensure once diet advanced.  Labs reviewed.  Diet Order:  Diet Carb Modified Fluid consistency:: Thin; Room service appropriate?: Yes  Skin:  Reviewed, no issues  Last BM:  9/10  Height:   Ht Readings from Last 1 Encounters:  03/28/15 5\' 3"  (1.6 m)    Weight:   Wt Readings from Last 1 Encounters:  04/06/15 202 lb 2.6  oz (91.7 kg)    Ideal Body Weight:  52.27 kg (kg)  BMI:  Body mass index is 35.82 kg/(m^2).  Estimated Nutritional Needs:   Kcal:  1850-2050  Protein:  120-130g  Fluid:  2L/day  EDUCATION NEEDS:   No education needs identified at this time  Clayton Bibles, MS, RD, LDN Pager: [phone] After Hours Pager: [phone]

## 2015-04-06 NOTE — Plan of Care (Signed)
Problem: Phase I Progression Outcomes Goal: OOB as tolerated unless otherwise ordered Outcome: Not Progressing Attempted to get patient out of bed to chair. Patient very weak and has not ambulated since 03/28/15. Maxisky lift not working and patient too weak and nauseated to stand. Physical therapy to evaluate and treat tomorrow.

## 2015-04-06 NOTE — Progress Notes (Signed)
PULMONARY / CRITICAL CARE MEDICINE   Name: Beth Meyers MRN: [id number] DOB: 1950-09-26    ADMISSION DATE:  03/28/2015 CONSULTATION DATE:  04/03/2015  REFERRING MD :  Dr. 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  STUDIES:  9/07 EEG >> triphasic waves 9/11: Persistent mild small bowel dilatation. No definitive transition zone is seen. No mass lesion is noted. Diverticulosis without definite diverticulitis. SIGNIFICANT EVENTS: 9/04 Admit, started precedex 9/06 Neurology/psychiatry consulted 9/10 Presumed aspiration, fever, VDRF, pressors 9/11 CT abd for high residuals-->ileus. 9/12 still pressor dependent but likely r/t sedation. Attempting weaning efforts. Resume trickle feeds.  9/12 self extubated. Able to stop all sedating gtts after this. 9/13: looking better. Weaning FIO2  SUBJECTIVE:  Eating. Comfortable, no acute complaints   VITAL SIGNS: Temp:  [98.6 F (37 C)-99.9 F (37.7 C)] 99.1 F (37.3 C) (09/13 0700) Pulse Rate:  [78-106] 106 (09/13 0900) Resp:  [12-32] 15 (09/13 0900) BP: (70-180)/(49-119) 149/67 mmHg (09/13 0900) SpO2:  [83 %-99 %] 97 % (09/13 0900) FiO2 (%):  [30 %] 30 % (09/12 1426) Weight:  [91.7 kg (202 lb 2.6 oz)] 91.7 kg (202 lb 2.6 oz) (09/13 0608) HEMODYNAMICS: CVP:  [19 mmHg] 19 mmHg VENTILATOR SETTINGS: Vent Mode:  [-] PCV FiO2 (%):  [30 %] 30 % Set Rate:  [15 bmp] 15 bmp PEEP:  [5 cmH20] 5 cmH20 Plateau Pressure:  [21 cmH20] 21 cmH20 INTAKE / OUTPUT:  Intake/Output Summary (Last 24 hours) at 04/06/15 1007 Last data filed at 04/06/15 0800  Gross per 24 hour  Intake 1918.58 ml  Output   1690 ml  Net 228.58 ml    PHYSICAL EXAMINATION: General: awake, no distress.  Neuro:  Awake and oriented   HEENT:  NCAT Cardiovascular:  Regular Lungs: decreased bases.  Abdomen:  Soft, non  tender Musculoskeletal:  No edema Skin:  No rashes  LABS:  CBC  Recent Labs Lab 04/04/15 0450 04/05/15 0350 04/06/15 0442  WBC 13.1* 10.3 8.8  HGB 11.5* 10.6* 10.3*  HCT 36.0 32.9* 31.4*  PLT 239 236 184   BMET  Recent Labs Lab 04/04/15 0450 04/05/15 0350 04/06/15 0442  NA 135 136 137  K 3.9 3.9 3.8  CL 108 108 106  CO2 20* 22 24  BUN 18 15 13   CREATININE 0.85 0.72 0.72  GLUCOSE 201* 133* 96   Electrolytes  Recent Labs Lab 04/04/15 0450 04/04/15 2015 04/05/15 0350 04/06/15 0442  CALCIUM 7.9*  --  8.1* 8.8*  MG 1.3* 2.5* 2.2  --      Sepsis Markers No results for input(s): LATICACIDVEN, PROCALCITON, O2SATVEN in the last 168 hours. ABG  Recent Labs Lab 04/02/15 1400 04/03/15 1410 04/04/15 0413  PHART 7.360 7.272* 7.302*  PCO2ART 32.4* 37.9 37.3  PO2ART 79.5* 297* 83.8   Liver Enzymes  Recent Labs Lab 04/06/15 0442  AST 16  ALT 19  ALKPHOS 51  BILITOT 0.7  ALBUMIN 2.5*   Glucose  Recent Labs Lab 04/05/15 0847 04/05/15 1138 04/05/15 1612 04/05/15 2022 04/06/15 0035 04/06/15 0437  GLUCAP 110* 127* 130* 106* 91 82    Imaging No results found. I reviewed CXR 9/12 >> increased L basilar ATX; otherwise no sig change   ASSESSMENT / PLAN:  PULMONARY ETT 9/10 >>9/12 A: Acute hypoxic respiratory failure 2nd to aspiration. AECOPD. Self extubated 9/12 P:   Wean FIO2 Wean steroids (change to 40mg   pred daily, then taper to off over 7-10 d) Add back spiriva and symbicort F/u CXR prior to dc   CARDIOVASCULAR Rt IJ CVL 9/10 >> A:  Septic shock-->resolved  Hx of HTN. P:  Consider resume Cozaar 9/14 if BP stable  RENAL A:   Intermittent Hypokalemia, hypomagnesemia. P:   Now all stable; de-escalate lab draws   GASTROINTESTINAL A:   Ileus (no obstruction by CT scan) Ascites Protein calorie malnutrition  >ileus clinically resolved. Tolerating PO clears.  P:   NPO Adv diet  HEMATOLOGIC A:   Anemia of critical  illness P:  F/u CBC SQ heparin for DVT prevention  INFECTIOUS A:   Aspiration PNA. Ecoli UTI (cipro resistent)  P:   Day of Abx 5/7 of vancomycin, primaxin; will d/c vanc   Blood 9/10 >> Urine 9/10: Ecoli (cipro resistant) Sputum 9/10 >>NPF  ENDOCRINE A:   Steroid induced hyperglycemia.   P:   SSI  NEUROLOGIC A:   Acute encephalopathy 2nd to ETOH withdrawal with concern for Wernicke's encephalopathy. Physical deconditioning  P:   Thiamine and folate replacement  PT consult  OOB  Updated pts family at bedside.  Discussion Self extubated. Delirium improved. Now deconditioned after acute illness. Will move her to medical ward. Have PT see her, adv diet and d/c vanc and complete 2 more days primaxin. . Not sure about Dispo yet. Wants to go home. Will need to see how strong she is. Will change to prednisone and her home BDs in re: to the AECOPD. Will ask IM to assume care effective 9/14.  Erick Colace ACNP-BC [place] Pager # [phone] OR # [phone] if no answer

## 2015-04-06 NOTE — Progress Notes (Signed)
Patient experienced another episode of vomiting (large brown color output). Order given for NG tube to low wall suction.

## 2015-04-06 NOTE — Progress Notes (Signed)
eLink Physician-Brief Progress Note Patient Name: Beth Meyers DOB: Oct 28, 1950 MRN: [id number]   Date of Service  04/06/2015  HPI/Events of Note  KUB still shows small bowel loops dilated. ?SBO  eICU Interventions  Hold transfer to floor for now      Intervention Category Intermediate Interventions: Diagnostic test evaluation  Asencion Noble 04/06/2015, 5:43 PM

## 2015-04-07 ENCOUNTER — Inpatient Hospital Stay (HOSPITAL_COMMUNITY): Payer: Medicare Other

## 2015-04-07 DIAGNOSIS — I10 Essential (primary) hypertension: Secondary | ICD-10-CM

## 2015-04-07 DIAGNOSIS — E512 Wernicke's encephalopathy: Secondary | ICD-10-CM

## 2015-04-07 DIAGNOSIS — E876 Hypokalemia: Secondary | ICD-10-CM

## 2015-04-07 DIAGNOSIS — J9601 Acute respiratory failure with hypoxia: Secondary | ICD-10-CM | POA: Diagnosis present

## 2015-04-07 DIAGNOSIS — F10231 Alcohol dependence with withdrawal delirium: Principal | ICD-10-CM

## 2015-04-07 DIAGNOSIS — G9340 Encephalopathy, unspecified: Secondary | ICD-10-CM

## 2015-04-07 DIAGNOSIS — J690 Pneumonitis due to inhalation of food and vomit: Secondary | ICD-10-CM

## 2015-04-07 DIAGNOSIS — A419 Sepsis, unspecified organism: Secondary | ICD-10-CM

## 2015-04-07 DIAGNOSIS — R6521 Severe sepsis with septic shock: Secondary | ICD-10-CM

## 2015-04-07 LAB — GLUCOSE, CAPILLARY
Glucose-Capillary: 77 mg/dL (ref 65–99)
Glucose-Capillary: 84 mg/dL (ref 65–99)
Glucose-Capillary: 84 mg/dL (ref 65–99)
Glucose-Capillary: 77 mg/dL (ref 65–99)

## 2015-04-07 MED ORDER — LOSARTAN POTASSIUM 50 MG PO TABS: 100.0000 mg | ORAL_TABLET | Freq: Every day | ORAL | Status: DC

## 2015-04-07 MED ORDER — METHYLPREDNISOLONE SODIUM SUCC 40 MG IJ SOLR
40.0000 mg | INTRAMUSCULAR | Status: DC
  Administered 2015-04-07 – 2015-04-09 (×3): 40 mg via INTRAVENOUS
  Filled 2015-04-07 (×3): qty 1

## 2015-04-07 MED ORDER — SODIUM CHLORIDE 0.45 % IV SOLN
INTRAVENOUS | Status: DC
  Administered 2015-04-07 – 2015-04-08 (×2): via INTRAVENOUS
  Filled 2015-04-07 (×9): qty 1000

## 2015-04-07 MED ORDER — ACETAMINOPHEN 650 MG RE SUPP
650.0000 mg | Freq: Four times a day (QID) | RECTAL | Status: DC | PRN
  Administered 2015-04-07: 650 mg via RECTAL
  Filled 2015-04-07 (×2): qty 1

## 2015-04-07 NOTE — Evaluation (Signed)
Physical Therapy Evaluation Patient Details Name: Beth Meyers MRN: [id number] DOB: 05-26-1951 Today's Date: 04/07/2015   History of Present Illness  64 year old female for encephalopathy alcohol withdrawal and COPD exacerbation/VDRF, self extubated on 9/12; past medical history of alcohol abuse   Clinical Impression  Pt admitted with above diagnosis. Pt currently with functional limitations due to the deficits listed below (see PT Problem List).  Pt will benefit from skilled PT to increase their independence and safety with mobility to allow discharge to the venue listed below.  Will follow; pt to go to floor today     Follow Up Recommendations SNF    Equipment Recommendations  None recommended by PT    Recommendations for Other Services       Precautions / Restrictions Precautions Precautions: Fall      Mobility  Bed Mobility Overal bed mobility: Needs Assistance Bed Mobility: Supine to Sit;Sit to Supine     Supine to sit: Mod assist Sit to supine: +2 for physical assistance;Max assist   General bed mobility comments: assist with trunk and LEs onto bed  Transfers Overall transfer level: Needs assistance Equipment used: Rolling walker (2 wheeled) Transfers: Sit to/from Stand           General transfer comment: x2; +2 required on second attempt and pt unable to come to full stand;  pt returned to supine for safety of staff and pt  Ambulation/Gait                Stairs            Wheelchair Mobility    Modified Rankin (Stroke Patients Only)       Balance Overall balance assessment: Needs assistance           Standing balance-Leahy Scale: Poor                               Pertinent Vitals/Pain Pain Assessment: Faces Pain Score: 2  Faces Pain Scale: Hurts a little bit Pain Descriptors / Indicators: Aching Pain Intervention(s): Limited activity within patient's tolerance;Monitored during session    Home Living  Family/patient expects to be discharged to:: Skilled nursing facility                      Prior Function Level of Independence: Needs assistance;Independent with assistive device(s)   Gait / Transfers Assistance Needed: pt reports she amb I'ly at baseline  ADL's / Homemaking Assistance Needed: pt husband does cooking, Nurse, children's Dominance        Extremity/Trunk Assessment   Upper Extremity Assessment: Generalized weakness           Lower Extremity Assessment: Generalized weakness         Communication      Cognition Arousal/Alertness: Awake/alert Behavior During Therapy: WFL for tasks assessed/performed Overall Cognitive Status: Impaired/Different from baseline Area of Impairment: Memory;Following commands;Problem solving       Following Commands: Follows one step commands with increased time     Problem Solving: Slow processing;Decreased initiation;Difficulty sequencing;Requires verbal cues;Requires tactile cues      General Comments      Exercises        Assessment/Plan    PT Assessment Patient needs continued PT services  PT Diagnosis Difficulty walking   PT Problem List Decreased strength;Decreased activity tolerance;Decreased balance;Decreased mobility;Decreased knowledge of use of DME;Decreased safety awareness  PT Treatment  Interventions DME instruction;Gait training;Functional mobility training;Therapeutic activities;Patient/family education;Therapeutic exercise   PT Goals (Current goals can be found in the Care Plan section) Acute Rehab PT Goals Patient Stated Goal: to walk PT Goal Formulation: With patient Time For Goal Achievement: 04/14/15 Potential to Achieve Goals: Good    Frequency Min 3X/week   Barriers to discharge        Co-evaluation               End of Session Equipment Utilized During Treatment: Gait belt Activity Tolerance: Patient tolerated treatment well;No increased pain Patient left: in  bed;with call bell/phone within reach Nurse Communication: Mobility status         Time: 5189-8421 PT Time Calculation (min) (ACUTE ONLY): 21 min   Charges:   PT Evaluation $Initial PT Evaluation Tier I: 1 Procedure     PT G CodesKenyon Meyers 04/28/2015, 4:39 PM

## 2015-04-07 NOTE — Progress Notes (Signed)
Clinical Social Work  CSW staffed case with psych MD who reports signing off case. Psych team can be re-consulted if further needs arise.  [place], [place] [phone]

## 2015-04-07 NOTE — Progress Notes (Signed)
Inserted NGT 5cm.

## 2015-04-07 NOTE — Progress Notes (Signed)
[facility] PROGRESS NOTE Interim History: 64 year old female with past medical history of alcohol abuse that comes in for encephalopathy alcohol withdrawal and COPD exacerbation.  SIGNIFICANT EVENTS: 9/04 Admit, started precedex 9/06 Neurology/psychiatry consulted 9/10 Presumed aspiration, fever, VDRF, pressors 9/11 CT abd for high residuals-->ileus. 9/12 still pressor dependent but likely r/t sedation. Attempting weaning efforts. Resume trickle feeds.  9/12 self extubated. Able to stop all sedating gtts after this. 9/13: looking better. Weaning FIO2  Assessment/Plan: Acute encephalopathy due to alcohol withdrawals and quantities encephalopathy.: CT of the head showed no acute intracranial findings UDS and alcohol level were negative on admission. Unable to perform MRI as patient was unable to lay still. Alcohol withdrawals were unable to be controlled without rashes she was started on Precedex drip and intubated PCCM was consulted. The patient transferred back to [facility] 04/07/2015. Neurology was consulted and was concerned that the clinical picture was consistent with Wernicke's encephalopathy. Seen by psychiatry who recommended to continue alcohol detox and supportive care. EEG was performed diagnoses show no seizure activity. Her mental status continues to improve  Acute respiratory failure with hypoxia due to  Aspiration pneumonia and COPD exacerbation and septic shock: - She aspirated positive episode of nausea and vomiting. - Evaluation was done and they recommended a dysphagia 1 diet. - On admission she was started on vancomycin and Zosyn which she had completed her course. - PCCM was consulted on 04/03/2015 due to her severe hypoxia and intubated later self extubated on 04/05/2015, she required pressors during this time which this was thought to be likely due to sedation - For her COPD she was started on IV Solu-Medrol and Duonebs. Concern that her's steroids might  be worsening her confusion they were stop on 03/29/2015. - She was continued on nasal cannula and her saturations after extubation have remained at 90% on 6 L. - Her prednisone was resumed on 04/09/2015 did work of breathing.  Essential hypertension: Due to her inability to swallow her antihypertensive medications were held on admission.  Hypokalemia: - Repleted continue to monitor   Normocytic anemia:: Clinical indication for transfusion at this point.  Ileus: Patient placed on nothing by mouth, continue to monitor electrolytes and replete as needed. NG tube placed to intermittent suction, 850 mL of NG put output. Continue to monitor strict I's and O's, started on IV fluids with potassium. Tap water enema.    Code Status: Full Family Communication: Family not at the bedside at this time  Disposition Plan: Admit for further evaluation   Consultants:  PCCM  Procedures:  ABD x-ray  CXR  Antibiotics:  Imipenem started on 04/03/2015  HPI/Subjective: She has no new complaints she is hungry and would like to.  Objective: Filed Vitals:   04/07/15 0000 04/07/15 0400 04/07/15 0600 04/07/15 0745  BP: 125/57 104/28 125/48 150/90  Pulse:      Temp: 99.7 F (37.6 C) 99.7 F (37.6 C) 99.3 F (37.4 C) 99.1 F (37.3 C)  TempSrc: Core (Comment) Core (Comment)  Core (Comment)  Resp: 16 15 14 15   Height:      Weight:      SpO2: 93% 89% 91% 94%    Intake/Output Summary (Last 24 hours) at 04/07/15 0828 Last data filed at 04/07/15 0610  Gross per 24 hour  Intake    480 ml  Output   1375 ml  Net   -895 ml   Filed Weights   04/04/15 0448 04/05/15 0400 04/06/15 0608  Weight: 86.5 kg (190  lb 11.2 oz) 92.7 kg (204 lb 5.9 oz) 91.7 kg (202 lb 2.6 oz)    Exam:  General: Alert, awake, oriented x3, in no acute distress.  HEENT: No bruits, no goiter.  Heart: Regular rate and rhythm. Lungs: Good air movement, clear Abdomen: Soft, nontender, nondistended, negative bowel  sounds.  Neuro: Grossly intact, nonfocal.   Data Reviewed: Basic Metabolic Panel:  Recent Labs Lab 04/02/15 0350 04/03/15 0404 04/04/15 0450 04/04/15 2015 04/05/15 0350 04/06/15 0442  NA 145 137 135  --  136 137  K 3.4* 3.4* 3.9  --  3.9 3.8  CL 113* 104 108  --  108 106  CO2 21* 23 20*  --  22 24  GLUCOSE 99 142* 201*  --  133* 96  BUN 17 14 18   --  15 13  CREATININE 0.60 0.70 0.85  --  0.72 0.72  CALCIUM 8.8* 9.2 7.9*  --  8.1* 8.8*  MG  --  1.5* 1.3* 2.5* 2.2  --    Liver Function Tests:  Recent Labs Lab 04/06/15 0442  AST 16  ALT 19  ALKPHOS 51  BILITOT 0.7  PROT 5.5*  ALBUMIN 2.5*   No results for input(s): LIPASE, AMYLASE in the last 168 hours.  Recent Labs Lab 03/31/15 1126  AMMONIA 14   CBC:  Recent Labs Lab 04/04/15 0450 04/05/15 0350 04/06/15 0442  WBC 13.1* 10.3 8.8  HGB 11.5* 10.6* 10.3*  HCT 36.0 32.9* 31.4*  MCV 91.6 91.1 90.5  PLT 239 236 184   Cardiac Enzymes: No results for input(s): CKTOTAL, CKMB, CKMBINDEX, TROPONINI in the last 168 hours. BNP (last 3 results)  Recent Labs  03/28/15 1056 04/03/15 1445  BNP 70.7 82.3    ProBNP (last 3 results) No results for input(s): PROBNP in the last 8760 hours.  CBG:  Recent Labs Lab 04/06/15 0740 04/06/15 1131 04/06/15 1605 04/06/15 2339 04/07/15 0738  GLUCAP 88 83 85 74 77    Recent Results (from the past 240 hour(s))  MRSA PCR Screening     Status: None   Collection Time: 03/28/15  4:00 PM  Result Value Ref Range Status   MRSA by PCR NEGATIVE NEGATIVE Final    Comment:        The GeneXpert MRSA Assay (FDA approved for NASAL specimens only), is one component of a comprehensive MRSA colonization surveillance program. It is not intended to diagnose MRSA infection nor to guide or monitor treatment for MRSA infections.   Culture, blood (routine x 2)     Status: None (Preliminary result)   Collection Time: 04/03/15  6:26 AM  Result Value Ref Range Status    Specimen Description BLOOD RIGHT ARM  Final   Special Requests BOTTLES DRAWN AEROBIC AND ANAEROBIC 10CC  Final   Culture   Final    NO GROWTH 3 DAYS Performed at [facility]    Report Status PENDING  Incomplete  Culture, blood (routine x 2)     Status: None (Preliminary result)   Collection Time: 04/03/15  6:41 AM  Result Value Ref Range Status   Specimen Description BLOOD RIGHT HAND  Final   Special Requests BOTTLES DRAWN AEROBIC ONLY 10CC  Final   Culture   Final    NO GROWTH 3 DAYS Performed at [facility]    Report Status PENDING  Incomplete  Culture, respiratory (NON-Expectorated)     Status: None   Collection Time: 04/03/15  2:51 PM  Result Value Ref Range  Status   Specimen Description TRACHEAL ASPIRATE  Final   Special Requests NONE  Final   Gram Stain   Final    FEW WBC PRESENT,BOTH PMN AND MONONUCLEAR NO SQUAMOUS EPITHELIAL CELLS SEEN RARE GRAM NEGATIVE RODS RARE GRAM POSITIVE COCCI IN PAIRS Performed at [facility]    Culture   Final    Non-Pathogenic Oropharyngeal-type Flora Isolated. Performed at [facility]    Report Status 04/06/2015 FINAL  Final  Culture, Urine     Status: None   Collection Time: 04/03/15  2:52 PM  Result Value Ref Range Status   Specimen Description URINE, CATHETERIZED  Final   Special Requests NONE  Final   Culture   Final    >=100,000 COLONIES/mL ESCHERICHIA COLI Performed at [facility]    Report Status 04/05/2015 FINAL  Final   Organism ID, Bacteria ESCHERICHIA COLI  Final      Susceptibility   Escherichia coli - MIC*    AMPICILLIN >=32 RESISTANT Resistant     CEFAZOLIN <=4 SENSITIVE Sensitive     CEFTRIAXONE <=1 SENSITIVE Sensitive     CIPROFLOXACIN >=4 RESISTANT Resistant     GENTAMICIN <=1 SENSITIVE Sensitive     IMIPENEM <=0.25 SENSITIVE Sensitive     NITROFURANTOIN <=16 SENSITIVE Sensitive     TRIMETH/SULFA <=20 SENSITIVE Sensitive     AMPICILLIN/SULBACTAM 16 INTERMEDIATE  Intermediate     PIP/TAZO <=4 SENSITIVE Sensitive     * >=100,000 COLONIES/mL ESCHERICHIA COLI     Studies: Dg Abd Portable 1v  04/06/2015   CLINICAL DATA:  Nausea and vomiting.  EXAM: PORTABLE ABDOMEN - 1 VIEW  COMPARISON:  None.  FINDINGS: Moderately dilated small bowel loops are noted concerning for ileus or distal small bowel obstruction. No radio-opaque calculi or other significant radiographic abnormality are seen.  IMPRESSION: Moderately dilated small bowel loops are noted centrally concerning for ileus or distal small bowel obstruction. Continued follow-up radiographic follow-up is recommended.   Electronically Signed   By: Marijo Conception, M.D.   On: 04/06/2015 16:52    Scheduled Meds: . budesonide-formoterol  2 puff Inhalation BID  . feeding supplement  1 Container Oral TID BM  . folic acid  1 mg Oral Daily  . heparin subcutaneous  5,000 Units Subcutaneous 3 times per day  . imipenem-cilastatin  500 mg Intravenous 4 times per day  . insulin aspart  0-9 Units Subcutaneous TID AC & HS  . methylPREDNISolone (SOLU-MEDROL) injection  40 mg Intravenous Q24H  . multivitamin  1 tablet Oral Daily  . thiamine  100 mg Oral Daily  . tiotropium  18 mcg Inhalation Daily  . traZODone  150 mg Oral QHS   Continuous Infusions:   Time Spent: 25 min   Charlynne Cousins  [facility] Pager [phone]. If 7PM-7AM, please contact night-coverage at [email or web address], password [facility] 04/07/2015, 8:28 AM  LOS: 10 days

## 2015-04-08 ENCOUNTER — Inpatient Hospital Stay (HOSPITAL_COMMUNITY): Payer: Medicare Other

## 2015-04-08 LAB — CULTURE, BLOOD (ROUTINE X 2)
CULTURE: NO GROWTH
Culture: NO GROWTH

## 2015-04-08 LAB — BASIC METABOLIC PANEL
ANION GAP: 8 (ref 5–15)
BUN: 13 mg/dL (ref 6–20)
CHLORIDE: 104 mmol/L (ref 101–111)
CO2: 27 mmol/L (ref 22–32)
CREATININE: 0.43 mg/dL — AB (ref 0.44–1.00)
Calcium: 8.4 mg/dL — ABNORMAL LOW (ref 8.9–10.3)
GFR calc non Af Amer: >60 mL/min (ref >60)
GLUCOSE: 76 mg/dL (ref 65–99)
Potassium: 3.1 mmol/L — ABNORMAL LOW (ref 3.5–5.1)
Sodium: 139 mmol/L (ref 135–145)

## 2015-04-08 LAB — GLUCOSE, CAPILLARY
GLUCOSE-CAPILLARY: 94 mg/dL (ref 65–99)
Glucose-Capillary: 67 mg/dL (ref 65–99)
Glucose-Capillary: 89 mg/dL (ref 65–99)
Glucose-Capillary: 92 mg/dL (ref 65–99)
Glucose-Capillary: 64 mg/dL — ABNORMAL LOW (ref 65–99)

## 2015-04-08 MED ORDER — POTASSIUM CHLORIDE 10 MEQ/100ML IV SOLN
10.0000 meq | INTRAVENOUS | Status: AC
  Administered 2015-04-08 (×5): 10 meq via INTRAVENOUS
  Filled 2015-04-08 (×5): qty 100

## 2015-04-08 MED ORDER — MAGNESIUM SULFATE 2 GM/50ML IV SOLN
2.0000 g | Freq: Once | INTRAVENOUS | Status: AC
  Administered 2015-04-08: 2 g via INTRAVENOUS
  Filled 2015-04-08: qty 50

## 2015-04-08 MED ORDER — DEXTROSE-NACL 5-0.9 % IV SOLN
INTRAVENOUS | Status: DC
  Administered 2015-04-08: 23:00:00 via INTRAVENOUS

## 2015-04-08 MED ORDER — GLUCOSE-VITAMIN C 4-6 GM-MG PO CHEW
CHEWABLE_TABLET | ORAL | Status: AC
  Administered 2015-04-08: 4 g
  Filled 2015-04-08: qty 1

## 2015-04-08 MED ORDER — SODIUM CHLORIDE 0.9 % IJ SOLN
10.0000 mL | INTRAMUSCULAR | Status: DC | PRN
Start: 2015-04-08 — End: 2015-04-09
  Administered 2015-04-08: 10 mL

## 2015-04-08 MED ORDER — DEXTROSE 50 % IV SOLN
INTRAVENOUS | Status: AC
  Administered 2015-04-08: 50 mL
  Filled 2015-04-08: qty 50

## 2015-04-08 NOTE — Progress Notes (Signed)
[facility] PROGRESS NOTE Interim History: 64 year old female with past medical history of alcohol abuse that comes in for encephalopathy alcohol withdrawal and COPD exacerbation.  SIGNIFICANT EVENTS: 9/04 Admit, started precedex 9/06 Neurology/psychiatry consulted 9/10 Presumed aspiration, fever, VDRF, pressors 9/11 CT abd for high residuals-->ileus. 9/12 still pressor dependent but likely r/t sedation. Attempting weaning efforts. Resume trickle feeds.  9/12 self extubated. Able to stop all sedating gtts after this. 9/13: looking better. Weaning FIO2  Assessment/Plan: Acute encephalopathy due to alcohol withdrawals and Hepatic encephalopathy.: CT of the head showed no acute intracranial findings UDS and alcohol level were negative on admission. Unable to perform MRI as patient was unable to lay still. Her mental status continues to improve, his jaw has resolved.  Acute respiratory failure with hypoxia due to  Aspiration pneumonia and COPD exacerbation and septic shock: - She had an aspirated episode likely due to nausea and vomiting. - Swallowing Evaluation was done and they recommended a dysphagia 1 diet. - On admission she was started on vancomycin and Zosyn which she had completed her course. - She was continued on nasal cannula and her saturations after extubation have remained at 90% on 6 L. - Her prednisone was resumed on 04/09/2015 , continue tapered as work of breathing is improving.  Essential hypertension: Due to her inability to swallow her antihypertensive medications were held on admission.  Hypokalemia: - We'll increase potassium in IV supplementation. Supplement magnesium mildly and recheck in the morning.  Normocytic anemia:: Clinical indication for transfusion at this point.  Ileus: Passing gas and had a bowel movement, will clamp the G-tube and start ice chips.    Code Status: Full Family Communication: Family not at the bedside at this time    Disposition Plan: Admit for further evaluation   Consultants:  PCCM  Procedures:  ABD x-ray  CXR  Antibiotics:  Imipenem started on 04/03/2015  HPI/Subjective: She has no new complaints she is hungry and would like to.  Objective: Filed Vitals:   04/07/15 2000 04/08/15 0112 04/08/15 0600 04/08/15 1027  BP: 140/59  100/66   Pulse: 80  93   Temp: 99.3 F (37.4 C)  97.5 F (36.4 C)   TempSrc: Oral  Oral   Resp: 21  20   Height:      Weight:      SpO2: 100% 99% 99% 92%    Intake/Output Summary (Last 24 hours) at 04/08/15 1151 Last data filed at 04/08/15 1000  Gross per 24 hour  Intake 1876.67 ml  Output   3650 ml  Net -1773.33 ml   Filed Weights   04/04/15 0448 04/05/15 0400 04/06/15 0608  Weight: 86.5 kg (190 lb 11.2 oz) 92.7 kg (204 lb 5.9 oz) 91.7 kg (202 lb 2.6 oz)    Exam:  General: Alert, awake, oriented x3, in no acute distress.  HEENT: No bruits, no goiter.  Heart: Regular rate and rhythm. Lungs: Good air movement, clear Abdomen: Soft, nontender, nondistended, negative bowel sounds.  Neuro: Grossly intact, nonfocal.   Data Reviewed: Basic Metabolic Panel:  Recent Labs Lab 04/03/15 0404 04/04/15 0450 04/04/15 2015 04/05/15 0350 04/06/15 0442 04/08/15 0525  NA 137 135  --  136 137 139  K 3.4* 3.9  --  3.9 3.8 3.1*  CL 104 108  --  108 106 104  CO2 23 20*  --  22 24 27   GLUCOSE 142* 201*  --  133* 96 76  BUN 14 18  --  15 13 13  CREATININE 0.70 0.85  --  0.72 0.72 0.43*  CALCIUM 9.2 7.9*  --  8.1* 8.8* 8.4*  MG 1.5* 1.3* 2.5* 2.2  --   --    Liver Function Tests:  Recent Labs Lab 04/06/15 0442  AST 16  ALT 19  ALKPHOS 51  BILITOT 0.7  PROT 5.5*  ALBUMIN 2.5*   No results for input(s): LIPASE, AMYLASE in the last 168 hours. No results for input(s): AMMONIA in the last 168 hours. CBC:  Recent Labs Lab 04/04/15 0450 04/05/15 0350 04/06/15 0442  WBC 13.1* 10.3 8.8  HGB 11.5* 10.6* 10.3*  HCT 36.0 32.9* 31.4*  MCV  91.6 91.1 90.5  PLT 239 236 184   Cardiac Enzymes: No results for input(s): CKTOTAL, CKMB, CKMBINDEX, TROPONINI in the last 168 hours. BNP (last 3 results)  Recent Labs  03/28/15 1056 04/03/15 1445  BNP 70.7 82.3    ProBNP (last 3 results) No results for input(s): PROBNP in the last 8760 hours.  CBG:  Recent Labs Lab 04/07/15 1201 04/07/15 1650 04/07/15 2215 04/08/15 0810 04/08/15 0928  GLUCAP 84 84 77 67 89    Recent Results (from the past 240 hour(s))  Culture, blood (routine x 2)     Status: None (Preliminary result)   Collection Time: 04/03/15  6:26 AM  Result Value Ref Range Status   Specimen Description BLOOD RIGHT ARM  Final   Special Requests BOTTLES DRAWN AEROBIC AND ANAEROBIC 10CC  Final   Culture   Final    NO GROWTH 4 DAYS Performed at [facility]    Report Status PENDING  Incomplete  Culture, blood (routine x 2)     Status: None (Preliminary result)   Collection Time: 04/03/15  6:41 AM  Result Value Ref Range Status   Specimen Description BLOOD RIGHT HAND  Final   Special Requests BOTTLES DRAWN AEROBIC ONLY 10CC  Final   Culture   Final    NO GROWTH 4 DAYS Performed at [facility]    Report Status PENDING  Incomplete  Culture, respiratory (NON-Expectorated)     Status: None   Collection Time: 04/03/15  2:51 PM  Result Value Ref Range Status   Specimen Description TRACHEAL ASPIRATE  Final   Special Requests NONE  Final   Gram Stain   Final    FEW WBC PRESENT,BOTH PMN AND MONONUCLEAR NO SQUAMOUS EPITHELIAL CELLS SEEN RARE GRAM NEGATIVE RODS RARE GRAM POSITIVE COCCI IN PAIRS Performed at [facility]    Culture   Final    Non-Pathogenic Oropharyngeal-type Flora Isolated. Performed at [facility]    Report Status 04/06/2015 FINAL  Final  Culture, Urine     Status: None   Collection Time: 04/03/15  2:52 PM  Result Value Ref Range Status   Specimen Description URINE, CATHETERIZED  Final   Special Requests  NONE  Final   Culture   Final    >=100,000 COLONIES/mL ESCHERICHIA COLI Performed at [facility]    Report Status 04/05/2015 FINAL  Final   Organism ID, Bacteria ESCHERICHIA COLI  Final      Susceptibility   Escherichia coli - MIC*    AMPICILLIN >=32 RESISTANT Resistant     CEFAZOLIN <=4 SENSITIVE Sensitive     CEFTRIAXONE <=1 SENSITIVE Sensitive     CIPROFLOXACIN >=4 RESISTANT Resistant     GENTAMICIN <=1 SENSITIVE Sensitive     IMIPENEM <=0.25 SENSITIVE Sensitive     NITROFURANTOIN <=16 SENSITIVE Sensitive  TRIMETH/SULFA <=20 SENSITIVE Sensitive     AMPICILLIN/SULBACTAM 16 INTERMEDIATE Intermediate     PIP/TAZO <=4 SENSITIVE Sensitive     * >=100,000 COLONIES/mL ESCHERICHIA COLI     Studies: Dg Abd Portable 1v  04/07/2015   CLINICAL DATA:  Encounter for nasogastric tube placement. Ileus. Also with history of COPD and hypertension.  EXAM: PORTABLE ABDOMEN - 1 VIEW  COMPARISON:  04/06/2015.  FINDINGS: Nasogastric tube passes below the diaphragm. Tip projects in the stomach. Side hole projects just above the gastroesophageal junction. Recommend further insertion of approximate 5 cm to allow more optimal positioning.  There are persistent dilated loops small bowel. There is no colonic distention. Findings may reflect a diffuse adynamic ileus, but could reflect a partial small bowel obstruction. The latter is favored.  IMPRESSION: NG tube tip in the proximal stomach. Recommend inserting 5 cm to allow the side hole to fully enter the stomach.   Electronically Signed   By: Lajean Manes M.D.   On: 04/07/2015 11:24   Dg Abd Portable 1v  04/06/2015   CLINICAL DATA:  Nausea and vomiting.  EXAM: PORTABLE ABDOMEN - 1 VIEW  COMPARISON:  None.  FINDINGS: Moderately dilated small bowel loops are noted concerning for ileus or distal small bowel obstruction. No radio-opaque calculi or other significant radiographic abnormality are seen.  IMPRESSION: Moderately dilated small bowel loops are  noted centrally concerning for ileus or distal small bowel obstruction. Continued follow-up radiographic follow-up is recommended.   Electronically Signed   By: Marijo Conception, M.D.   On: 04/06/2015 16:52    Scheduled Meds: . budesonide-formoterol  2 puff Inhalation BID  . feeding supplement  1 Container Oral TID BM  . folic acid  1 mg Oral Daily  . heparin subcutaneous  5,000 Units Subcutaneous 3 times per day  . imipenem-cilastatin  500 mg Intravenous 4 times per day  . insulin aspart  0-9 Units Subcutaneous TID AC & HS  . methylPREDNISolone (SOLU-MEDROL) injection  40 mg Intravenous Q24H  . multivitamin  1 tablet Oral Daily  . thiamine  100 mg Oral Daily  . tiotropium  18 mcg Inhalation Daily  . traZODone  150 mg Oral QHS   Continuous Infusions: . sodium chloride 0.45 % 1,000 mL with potassium chloride 40 mEq infusion Stopped (04/08/15 1123)    Time Spent: 25 min   FELIZ Marguarite Arbour  [facility] Pager [phone]. If 7PM-7AM, please contact night-coverage at [email or web address], password [facility] 04/08/2015, 11:51 AM  LOS: 11 days

## 2015-04-08 NOTE — Progress Notes (Signed)
ANTIBIOTIC CONSULT NOTE - Follow Up  Pharmacy Consult for  imipenem Indication: rule out pneumonia  Allergies  Allergen Reactions  . Penicillins     REACTION: hives    Patient Measurements: Height: 5\' 3"  (160 cm) Weight: 202 lb 2.6 oz (91.7 kg) IBW/kg (Calculated) : 52.4 Adjusted Body Weight:   Vital Signs: Temp: 97.5 F (36.4 C) (09/15 0600) Temp Source: Oral (09/15 0600) BP: 100/66 mmHg (09/15 0600) Pulse Rate: 93 (09/15 0600) Intake/Output from previous day: 09/14 0701 - 09/15 0700 In: 1456.7 [I.V.:1256.7; IV Piggyback:200] Out: 2550 [Urine:1250; Emesis/NG output:1300] Intake/Output from this shift: Total I/O In: 420 [I.V.:420] Out: 600 [Urine:600]  Labs:  Recent Labs  04/06/15 0442 04/08/15 0525  WBC 8.8  --   HGB 10.3*  --   PLT 184  --   CREATININE 0.72 0.43*   Estimated Creatinine Clearance: 74.4 mL/min (by C-G formula based on Cr of 0.43).  Recent Labs  04/06/15 0923  [facility] 20     Microbiology: Recent Results (from the past 720 hour(s))  MRSA PCR Screening     Status: None   Collection Time: 03/28/15  4:00 PM  Result Value Ref Range Status   MRSA by PCR NEGATIVE NEGATIVE Final    Comment:        The GeneXpert MRSA Assay (FDA approved for NASAL specimens only), is one component of a comprehensive MRSA colonization surveillance program. It is not intended to diagnose MRSA infection nor to guide or monitor treatment for MRSA infections.   Culture, blood (routine x 2)     Status: None (Preliminary result)   Collection Time: 04/03/15  6:26 AM  Result Value Ref Range Status   Specimen Description BLOOD RIGHT ARM  Final   Special Requests BOTTLES DRAWN AEROBIC AND ANAEROBIC 10CC  Final   Culture   Final    NO GROWTH 4 DAYS Performed at [facility]    Report Status PENDING  Incomplete  Culture, blood (routine x 2)     Status: None (Preliminary result)   Collection Time: 04/03/15  6:41 AM  Result Value Ref Range Status   Specimen Description BLOOD RIGHT HAND  Final   Special Requests BOTTLES DRAWN AEROBIC ONLY 10CC  Final   Culture   Final    NO GROWTH 4 DAYS Performed at [facility]    Report Status PENDING  Incomplete  Culture, respiratory (NON-Expectorated)     Status: None   Collection Time: 04/03/15  2:51 PM  Result Value Ref Range Status   Specimen Description TRACHEAL ASPIRATE  Final   Special Requests NONE  Final   Gram Stain   Final    FEW WBC PRESENT,BOTH PMN AND MONONUCLEAR NO SQUAMOUS EPITHELIAL CELLS SEEN RARE GRAM NEGATIVE RODS RARE GRAM POSITIVE COCCI IN PAIRS Performed at [facility]    Culture   Final    Non-Pathogenic Oropharyngeal-type Flora Isolated. Performed at [facility]    Report Status 04/06/2015 FINAL  Final  Culture, Urine     Status: None   Collection Time: 04/03/15  2:52 PM  Result Value Ref Range Status   Specimen Description URINE, CATHETERIZED  Final   Special Requests NONE  Final   Culture   Final    >=100,000 COLONIES/mL ESCHERICHIA COLI Performed at [facility]    Report Status 04/05/2015 FINAL  Final   Organism ID, Bacteria ESCHERICHIA COLI  Final      Susceptibility   Escherichia coli - MIC*  AMPICILLIN >=32 RESISTANT Resistant     CEFAZOLIN <=4 SENSITIVE Sensitive     CEFTRIAXONE <=1 SENSITIVE Sensitive     CIPROFLOXACIN >=4 RESISTANT Resistant     GENTAMICIN <=1 SENSITIVE Sensitive     IMIPENEM <=0.25 SENSITIVE Sensitive     NITROFURANTOIN <=16 SENSITIVE Sensitive     TRIMETH/SULFA <=20 SENSITIVE Sensitive     AMPICILLIN/SULBACTAM 16 INTERMEDIATE Intermediate     PIP/TAZO <=4 SENSITIVE Sensitive     * >=100,000 COLONIES/mL ESCHERICHIA COLI    Medical History: Past Medical History  Diagnosis Date  . COPD (chronic obstructive pulmonary disease)   . Hypertension   . Lumbago   . Depression   . Anxiety   . GERD (gastroesophageal reflux disease)    Assessment: 80 YOF presented 9/4 with dyspnea secondary  to AECOPD.  Patient developed worsening fever and increased O2 demands, pharmacy asked to dose vancomycin and imipenem for PNA (no aspiration pneumonia seen on CXR 9/9).  Also, treating E.coli UTI.  PCN allergy = hives (no documented cephalosporin use)  9/10 >> vanco  >> 9/13 9/10 >> imipenem  >>    9/10 blood: ngtd 9/10 urine: E.coli (R-amp/cipro, I-unasyn, S-Ancef/CTX/Primaxin/Zosyn/Septra/Gent) 9/10 trach aspirate: nl flora  Today, 04/08/2015: Tmax 100.2 WBC normalized SCr stable, CrCl~74 ml/min   Goal of Therapy:  Vancomycin trough level 15-20 mcg/ml  Plan:   Continue Imipenem 500mg  IV q6h  Dolly Rias RPh 04/08/2015, 11:07 AM Pager [phone]

## 2015-04-08 NOTE — Progress Notes (Addendum)
Hypoglycemic Event  CBG: 67  Treatment: 3 glucose tabs  Symptoms: None  Follow-up CBG: Time: 0928 CBG Result 89  Possible Reasons for Event: Inadequate meal intake  Comments/MD notified:Yes    Beth Meyers A  Remember to initiate Hypoglycemia Order Set & complete

## 2015-04-08 NOTE — Care Management Note (Signed)
Case Management Note  Patient Details  Name: Beth Meyers MRN: [id number] Date of Birth: [birth year]  Subjective/Objective:                   agitation, respiratory distress  Action/Plan:  Discharge planning Expected Discharge Date:  04/09/15              Expected Discharge Plan:  Skilled Nursing Facility  In-House Referral:  Clinical Social Work  Discharge planning Services  CM Consult  Post Acute Care Choice:  NA Choice offered to:  NA  DME Arranged:    DME Agency:     HH Arranged:    [place] Agency:     Status of Service:  Completed, signed off  Medicare Important Message Given:    Date Medicare IM Given:    Medicare IM give by:    Date Additional Medicare IM Given:    Additional Medicare Important Message give by:     If discussed at [place] of Stay Meetings, dates discussed:    Additional Comments: CM notes pt to go to SNF for ST rehab; CSW aware and arranging.  No other CM needs were communicated.  Dellie Catholic, RN 04/08/2015, 3:45 PM

## 2015-04-08 NOTE — Progress Notes (Signed)
04/08/15 2200 Nursing Patient with cbg of 64 at his time. Patient NPO. Half an amp of d50 given iv at this time per hypoglycemic protocol. Dr. Olevia Bowens paged with cbg. Order received for d5ns at 75/hr per iv. Rechecked cbg at 11p for 109.  Will continue to monitor patient

## 2015-04-08 NOTE — Progress Notes (Signed)
Clinical Social Work  Consult received to assist with SNF placement. CSW met with patient's husband at bedside while patient worked with Engineer, civil (consulting). Patient and husband spoke with MD re: SNF placement and are agreeable for [facility]. CSW provided SNF list and explained process. Husband is interested in [facility] or [facility] but agreeable to county wide search for [facility] and [facility]. CSW faxed out and will follow up with bed offers.  [place], [place] [phone]

## 2015-04-08 NOTE — Progress Notes (Signed)
Oxygen tube fell out for a couple of mintes brought her levels down. Put it back in ad recived a 99

## 2015-04-08 NOTE — Progress Notes (Signed)
Pt attempted to pull her NG out tonight several times tonight. I asked her why she was doing that, and she said "Because it was bothering me". I explained to pt several times why it was important to keep it in, and only the Md could allow [place] to take it out. She voiced understanding. Just to safe I applied bilateral mittens to slow her down. Will continue to monitor.

## 2015-04-08 NOTE — Progress Notes (Signed)
Physical Therapy Treatment Patient Details Name: Beth Meyers MRN: [id number] DOB: 27-Apr-1951 Today's Date: 04/08/2015    History of Present Illness 64 year old female for encephalopathy alcohol withdrawal and COPD exacerbation/VDRF, self extubated on 9/12; past medical history of alcohol abuse     PT Comments    Progressing slowly; pt unable to stand today, unsure if possibly a cognitive component to this as she was able to stand x 2 yesterday; she was able to participate in LE ex's without too much difficulty; continue to recommend SNF  Follow Up Recommendations  Supervision/Assistance - 24 hour;SNF     Equipment Recommendations  None recommended by PT    Recommendations for Other Services       Precautions / Restrictions Precautions Precautions: Fall    Mobility  Bed Mobility Overal bed mobility: Needs Assistance;+2 for physical assistance Bed Mobility: Supine to Sit     Supine to sit: +2 for physical assistance;Mod assist;Min assist;HOB elevated     General bed mobility comments: assist with scooting, turnk to come to upright and to bring LEs off bed; multi-modal cues for participation, sequencing, extra time  Transfers Overall transfer level: Needs assistance Equipment used: Rolling walker (2 wheeled) Transfers: Sit to/from Stand;Lateral/Scoot Transfers Sit to Stand: +2 physical assistance;Total assist        Lateral/Scoot Transfers: +2 physical assistance;Max assist General transfer comment: pt unable to WB on LEs today, unable to come to stand with +2 assist  Ambulation/Gait             General Gait Details: unable to stand   Stairs            Wheelchair Mobility    Modified Rankin (Stroke Patients Only)       Balance Overall balance assessment: Needs assistance           Standing balance-Leahy Scale: Zero Standing balance comment: unable to stand                    Cognition Arousal/Alertness: Awake/alert Behavior  During Therapy: WFL for tasks assessed/performed Overall Cognitive Status: Impaired/Different from baseline Area of Impairment: Memory;Following commands;Problem solving;Safety/judgement       Following Commands: Follows multi-step commands inconsistently Safety/Judgement: Decreased awareness of safety;Decreased awareness of deficits   Problem Solving: Slow processing;Decreased initiation;Difficulty sequencing;Requires verbal cues;Requires tactile cues General Comments: pt is alert and oriented but with incr difficulty following functional commands today (ie - unable to assist with standing)      Exercises  bil LE therapeutic ex-->  heel slides x 5 LAQs x 5 Ankle pumps x 10     General Comments        Pertinent Vitals/Pain Pain Assessment: No/denies pain    Home Living                      Prior Function            PT Goals (current goals can now be found in the care plan section) Acute Rehab PT Goals Patient Stated Goal: to walk PT Goal Formulation: With patient Time For Goal Achievement: 04/14/15 Potential to Achieve Goals: Good Progress towards PT goals: Not progressing toward goals - comment (see note)    Frequency  Min 3X/week    PT Plan Current plan remains appropriate    Co-evaluation             End of Session Equipment Utilized During Treatment: Gait belt Activity Tolerance: Patient limited by fatigue Patient left:  in chair;with call bell/phone within reach;Other (comment) (on lift pad for back to bed, RN notified)     Time: 1292-9090 PT Time Calculation (min) (ACUTE ONLY): 33 min  Charges:  $Therapeutic Activity: 23-37 mins                    G Codes:      Traylen Eckels 2015-04-18, 12:16 PM

## 2015-04-08 NOTE — Clinical Social Work Placement (Signed)
   CLINICAL SOCIAL WORK PLACEMENT  NOTE  Date:  04/08/2015  Patient Details  Name: Beth Meyers MRN: [id number] Date of Birth: 05-03-1951  Clinical Social Work is seeking post-discharge placement for this patient at the [place] level of care (*CSW will initial, date and re-position this form in  chart as items are completed):  Yes   Patient/family provided with [place] Work Department's list of facilities offering this level of care within the geographic area requested by the patient (or if unable, by the patient's family).  Yes   Patient/family informed of their freedom to choose among providers that offer the needed level of care, that participate in Medicare, Medicaid or managed care program needed by the patient, have an available bed and are willing to accept the patient.  Yes   Patient/family informed of [facility] ownership interest in [facility] and [facility], as well as of the fact that they are under no obligation to receive care at these facilities.  PASRR submitted to EDS on 04/08/15     PASRR number received on       Existing PASRR number confirmed on       FL2 transmitted to all facilities in geographic area requested by pt/family on 04/08/15     FL2 transmitted to all facilities within larger geographic area on       Patient informed that his/her managed care company has contracts with or will negotiate with certain facilities, including the following:            Patient/family informed of bed offers received.  Patient chooses bed at       Physician recommends and patient chooses bed at      Patient to be transferred to   on  .  Patient to be transferred to facility by       Patient family notified on   of transfer.  Name of family member notified:        PHYSICIAN       Additional Comment:    _______________________________________________ Boone Master, [place] 04/08/2015, 2:19 PM

## 2015-04-09 DIAGNOSIS — F10230 Alcohol dependence with withdrawal, uncomplicated: Secondary | ICD-10-CM

## 2015-04-09 DIAGNOSIS — J441 Chronic obstructive pulmonary disease with (acute) exacerbation: Secondary | ICD-10-CM

## 2015-04-09 LAB — BASIC METABOLIC PANEL
Anion gap: 8 (ref 5–15)
BUN: 11 mg/dL (ref 6–20)
CHLORIDE: 104 mmol/L (ref 101–111)
CO2: 26 mmol/L (ref 22–32)
CREATININE: 0.46 mg/dL (ref 0.44–1.00)
Calcium: 8 mg/dL — ABNORMAL LOW (ref 8.9–10.3)
GFR calc Af Amer: >60 mL/min (ref >60)
GFR calc non Af Amer: >60 mL/min (ref >60)
GLUCOSE: 78 mg/dL (ref 65–99)
POTASSIUM: 3.6 mmol/L (ref 3.5–5.1)
SODIUM: 138 mmol/L (ref 135–145)

## 2015-04-09 LAB — MAGNESIUM: Magnesium: 2 mg/dL (ref 1.7–2.4)

## 2015-04-09 LAB — GLUCOSE, CAPILLARY
GLUCOSE-CAPILLARY: 151 mg/dL — AB (ref 65–99)
Glucose-Capillary: 109 mg/dL — ABNORMAL HIGH (ref 65–99)
Glucose-Capillary: 94 mg/dL (ref 65–99)
Glucose-Capillary: 114 mg/dL — ABNORMAL HIGH (ref 65–99)

## 2015-04-09 MED ORDER — CITALOPRAM HYDROBROMIDE 40 MG PO TABS
40.0000 mg | ORAL_TABLET | Freq: Every day | ORAL | Status: DC
  Administered 2015-04-09: 40 mg via ORAL
  Filled 2015-04-09: qty 1

## 2015-04-09 MED ORDER — PANTOPRAZOLE SODIUM 40 MG PO TBEC
40.0000 mg | DELAYED_RELEASE_TABLET | Freq: Every day | ORAL | Status: DC
  Administered 2015-04-09: 40 mg via ORAL
  Filled 2015-04-09: qty 1

## 2015-04-09 MED ORDER — LIP MEDEX EX OINT
TOPICAL_OINTMENT | CUTANEOUS | Status: AC
Start: 2015-04-09 — End: 2015-04-09
  Administered 2015-04-09: 1
  Filled 2015-04-09: qty 7

## 2015-04-09 MED ORDER — FUROSEMIDE 10 MG/ML IJ SOLN: 40.0000 mg | Freq: Two times a day (BID) | INTRAMUSCULAR | Status: DC

## 2015-04-09 MED ORDER — TIOTROPIUM BROMIDE MONOHYDRATE 18 MCG IN CAPS: 18.0000 ug | ORAL_CAPSULE | Freq: Every day | RESPIRATORY_TRACT | Status: DC

## 2015-04-09 MED ORDER — FUROSEMIDE 40 MG PO TABS: 40.0000 mg | ORAL_TABLET | Freq: Two times a day (BID) | ORAL | Status: DC

## 2015-04-09 MED ORDER — FENOFIBRATE 160 MG PO TABS
160.0000 mg | ORAL_TABLET | Freq: Every day | ORAL | Status: DC
  Administered 2015-04-09: 160 mg via ORAL
  Filled 2015-04-09: qty 1

## 2015-04-09 MED ORDER — FUROSEMIDE 10 MG/ML IJ SOLN
80.0000 mg | Freq: Two times a day (BID) | INTRAMUSCULAR | Status: DC
  Administered 2015-04-09 (×2): 80 mg via INTRAVENOUS
  Filled 2015-04-09 (×2): qty 8

## 2015-04-09 MED ORDER — FUROSEMIDE 10 MG/ML IJ SOLN: 80.0000 mg | Freq: Two times a day (BID) | INTRAMUSCULAR | Status: DC

## 2015-04-09 MED ORDER — POTASSIUM CHLORIDE 20 MEQ/15ML (10%) PO SOLN
40.0000 meq | Freq: Two times a day (BID) | ORAL | Status: DC
  Administered 2015-04-09: 40 meq via ORAL
  Filled 2015-04-09 (×2): qty 30

## 2015-04-09 NOTE — Care Management Note (Signed)
Case Management Note  Patient Details  Name: Beth Meyers MRN: [id number] Date of Birth: [birth year]  Subjective/Objective:                    Action/Plan:d/c SNF.   Expected Discharge Date:                  Expected Discharge Plan:  Skilled Nursing Facility  In-House Referral:  Clinical Social Work  Discharge planning Services  CM Consult  Post Acute Care Choice:  NA Choice offered to:  NA  DME Arranged:    DME Agency:     HH Arranged:    [place] Agency:     Status of Service:  Completed, signed off  Medicare Important Message Given:    Date Medicare IM Given:    Medicare IM give by:    Date Additional Medicare IM Given:    Additional Medicare Important Message give by:     If discussed at [place] of Stay Meetings, dates discussed:    Additional Comments:  Dessa Phi, RN 04/09/2015, 9:48 AM

## 2015-04-09 NOTE — Discharge Summary (Signed)
Physician Discharge Summary  Manna Gose Leonhart PFX:[id number] DOB: 11-23-1950 DOA: 03/28/2015  PCP: Lynne Logan, MD  Admit date: 03/28/2015 Discharge date: 04/09/2015  Time spent: 35 minutes  Recommendations for Outpatient Follow-up:  1. Follow-up with primary care doctor in 2-4 weeks will titrate antihypertensive medications as needed. Schedule colonoscopy as an outpatient as needed. 2. She will go on Lasix will have to wait daily at facility, will check a basic metabolic panel at this time and continue to monitor her potassium.  Discharge Diagnoses:  Principal Problem:   Alcohol withdrawal Active Problems:   Acute encephalopathy   COPD exacerbation   Hypokalemia   Essential hypertension   Dyslipidemia   Severe sepsis with septic shock   Aspiration pneumonia   Acute respiratory failure with hypoxia   Wernicke encephalopathy   Discharge Condition: Stable  Diet recommendation: low sodium fluid restricted  Filed Weights   04/04/15 0448 04/05/15 0400 04/06/15 0608  Weight: 86.5 kg (190 lb 11.2 oz) 92.7 kg (204 lb 5.9 oz) 91.7 kg (202 lb 2.6 oz)    History of present illness:  64 year old with past medical history of COPD, anxiety and depression that came into the ED with worsening shortness of breath for several days prior to admission, she relates she try to her home inhalers and nebulizer without any relief. In the ED she was found to be hypotensive and hypoxemic so we're consulted for further evaluation.  Hospital Course:  Acute encephalopathy due to all all withdrawal and probably hepatic encephalopathy: - CT of the head showed no acute abnormalities, UDS was done which was negative for alcohol level was less than 10. - She was started on Ativan protocol for her withdrawals, and we were not able to control and Zosyn PCCM was consulted and she was started on a Precedex drip on 9.4.2016 and she was intubated she self extubated herself on 04/05/2015. - During the Precedex use  she had to be on pressors temporarily probably do to her about sepsis and sedatives. - Neurology was consulted and was concerned about her clinical picture being compatible with Wernicke's encephalopathy, they recommended an EEG that showed no seizure activities. Psychiatry evaluated the patient and recommended to continue ALT call detox and supportive treatment. - Over the next several days her mental status improved and she was able to communicate and as per family back to baseline. She was transferred back to [facility] on 04/07/2015.  Acute respiratory failure with hypoxia due to aspiration pneumonia and COPD exacerbation: Her swallowing evaluation was positive for high risk of aspiration. She was started on vancomycin and Zosyn on admission and she completed her course. PCCM was consulted and she was intubated on 04/03/2015 she self extubated herself on 04/05/2015. Started on IV Solu-Medrol for possible bronchospasm and she has completed her course of steroids and antibiotics.  Essential hypertension: Due to her inability to swallow her pills her antihypertensive medications were held while she was able to tolerate diet due to her ileus and being extubated her home regimen was continue with no changes.  Hypokalemia: This is likely related to poor intake is resolved with repletion.  Normocytic anemia: No indication for transfusion at this time. She will need to follow-up with primary care doctor and recommended colonoscopy as an outpatient.  Ileus:: The patient was put nothing by mouth narcotics were held electro-lites were repleted as needed. An NG tube was placed to intermittent suction and 04/07/2015 the patient removed the NG tube on 04/08/2015. She was started on IV shift  to she tolerated than her diet was a basilar she was passing gas and having regular bowel movements.   Procedures:  Abdominal x-ray showed ileus with NG tube in  place.    Consultations:  PCCM  Discharge Exam: Filed Vitals:   04/09/15 0915  BP: 148/82  Pulse: 109  Temp: 98 F (36.7 C)  Resp: 20    General: A&O x3 Cardiovascular: RRR Respiratory: good air movement CTA B/L  Discharge Instructions   Discharge Instructions    Diet - low sodium heart healthy    Complete by:  As directed      Increase activity slowly    Complete by:  As directed           Current Discharge Medication List    CONTINUE these medications which have CHANGED   Details  furosemide (LASIX) 40 MG tablet Take 1 tablet (40 mg total) by mouth 2 (two) times daily. Qty: 30 tablet, Refills: 0      CONTINUE these medications which have NOT CHANGED   Details  albuterol (PROVENTIL HFA;VENTOLIN HFA) 108 (90 BASE) MCG/ACT inhaler Inhale 2 puffs into the lungs every 6 (six) hours as needed for wheezing or shortness of breath.    Calcium Carbonate-Vitamin D (CALCIUM-VITAMIN D) 600-200 MG-UNIT CAPS Take 1 tablet by mouth daily.     citalopram (CELEXA) 40 MG tablet Take 40 mg by mouth daily.      famotidine-calcium carbonate-magnesium hydroxide (PEPCID COMPLETE) 10-800-165 MG CHEW chewable tablet Chew 1 tablet by mouth daily as needed (heartburn).    levalbuterol (XOPENEX) 1.25 MG/3ML nebulizer solution Take 1 ampule by nebulization every 4 (four) hours as needed for wheezing or shortness of breath.     losartan (COZAAR) 100 MG tablet Take 100 mg by mouth daily.      omeprazole (PRILOSEC) 20 MG capsule Take 20 mg by mouth daily.     ondansetron (ZOFRAN-ODT) 8 MG disintegrating tablet Take 4-8 mg by mouth every 8 (eight) hours as needed for nausea.     SYMBICORT 160-4.5 MCG/ACT inhaler INHALE 2 PUFFS INTO THE LUNGS TWICE A DAY Qty: 3 Inhaler, Refills: 0    traZODone (DESYREL) 150 MG tablet Take 150 mg by mouth at bedtime.      fenofibrate (TRICOR) 145 MG tablet Take 145 mg by mouth daily.      SPIRIVA HANDIHALER 18 MCG inhalation capsule INHALE THE  CONTENTS OF 1 CAPSULE USING 2 INHALATIONS ONCE DAILY Qty: 30 capsule, Refills: 0      STOP taking these medications     Aspirin-Salicylamide-Caffeine (BC HEADACHE POWDER PO)      ibuprofen (ADVIL) 200 MG tablet        Allergies  Allergen Reactions  . Penicillins     REACTION: hives   Follow-up Information    Follow up with Lynne Logan, MD In 3 weeks.   Specialty:  Family Medicine   Why:  hospital follow up       The results of significant diagnostics from this hospitalization (including imaging, microbiology, ancillary and laboratory) are listed below for reference.    Significant Diagnostic Studies: Dg Chest 1 View  04/03/2015   CLINICAL DATA:  64 year old female with a history respiratory failure. Congestion, fever.  EXAM: CHEST  1 VIEW  COMPARISON:  04/02/2015  FINDINGS: Cardiomediastinal silhouette unchanged. Atherosclerotic calcifications of the aortic arch.  Low lung volumes accentuates the interstitium.  Opacity at the left base similar to the prior.  Interstitial opacities persist.  No confluent airspace disease.  No pneumothorax.  No displaced fracture.  Degenerative changes of the shoulders.  IMPRESSION: Similar appearance to the prior with chronic lung changes, and no evidence of lobar pneumonia. Opacity at the left base may represent atelectasis or scarring. Small left pleural effusion not excluded.  Signed,  Dulcy Fanny. Earleen Newport, DO  [facility]  [facility]   Electronically Signed   By: Corrie Mckusick D.O.   On: 04/03/2015 12:05   Dg Chest 2 View  03/28/2015   CLINICAL DATA:  Shortness of breath and chest pain for 3 days. History of hypertension, asthma, COPD, emphysema.  EXAM: CHEST  2 VIEW  COMPARISON:  None.  FINDINGS: Heart size is normal. There is mild perihilar peribronchial thickening. Emphysematous changes are present with crowded markings at the bases. There are no focal consolidations or pleural effusions. No  pulmonary edema.  Numerous old right rib fractures are present. There are osteoporotic type fractures of T6, T8, and T9.  IMPRESSION: 1. Bronchitic changes.  Emphysematous changes. 2.  No focal acute pulmonary abnormality. 3. Osteoporotic type fractures of the thoracic spine. Consider evaluation and treatment for osteoporosis as appropriate.   Electronically Signed   By: Nolon Nations M.D.   On: 03/28/2015 10:50   Dg Abd 1 View  04/08/2015   CLINICAL DATA:  Followup ileus.  EXAM: ABDOMEN - 1 VIEW  COMPARISON:  04/07/2015  FINDINGS: Nasogastric tube tip is in the distal stomach. There are persistent dilated loops of small bowel, not significantly improved from previous exam. No free air identified.  IMPRESSION: Persistent ileus pattern.   Electronically Signed   By: Kerby Moors M.D.   On: 04/08/2015 14:13   Ct Angio Chest Pe W/cm &/or Wo Cm  03/28/2015   CLINICAL DATA:  64 year old female with shortness of breath, altered mental status and history of COPD. Patient is on home oxygen.  EXAM: CT ANGIOGRAPHY CHEST WITH CONTRAST  TECHNIQUE: Multidetector CT imaging of the chest was performed using the standard protocol during bolus administration of intravenous contrast. Multiplanar CT image reconstructions and MIPs were obtained to evaluate the vascular anatomy.  CONTRAST:  123mL OMNIPAQUE IOHEXOL 350 MG/ML SOLN  COMPARISON:  Chest x-ray obtained earlier today  FINDINGS: Mediastinum: Unremarkable CT appearance of the thyroid gland. No suspicious mediastinal or hilar adenopathy. No soft tissue mediastinal mass. The thoracic esophagus is unremarkable.  Heart/Vascular: Adequate opacification of the pulmonary arteries to the proximal subsegmental level. Evaluation beyond the segmental arteries is limited secondary to significant respiratory motion. No evidence of central pulmonary embolus. The heart is within normal limits for size. There is no pericardial effusion. Evaluation for coronary artery calcium  significantly limited secondary to patient motion. No evidence of aortic aneurysm. Trace atherosclerotic calcifications present at the origins of the great vessels.  Lungs/Pleura: Mild biapical pleural parenchymal scarring. Moderately advanced predominantly centrilobular pulmonary emphysema. Trace dependent atelectasis. No focal airspace consolidation, evidence of pulmonary edema or suspicious nodule or mass. Evaluation for small nodules is limited by respiratory motion.  Bones/Soft Tissues: Multiple remote healed right-sided rib fractures. No acute fracture or aggressive appearing lytic or blastic osseous lesion.  Upper Abdomen: Visualized upper abdominal organs are unremarkable.  Review of the MIP images confirms the above findings.  IMPRESSION: 1. Negative for evidence of acute central pulmonary embolus, pneumonia or other acute cardiopulmonary process. 2. Moderate image degradation secondary to significant respiratory and patient related motion artifact. 3. Moderately advanced centrilobular emphysema. 4. Multiple remote right-sided rib fractures noted incidentally.  Electronically Signed   By: Jacqulynn Cadet M.D.   On: 03/28/2015 13:50   Ct Abdomen Pelvis W Contrast  04/04/2015   CLINICAL DATA:  Small bowel dilatation on recent abdominal film  EXAM: CT ABDOMEN AND PELVIS WITH CONTRAST  TECHNIQUE: Multidetector CT imaging of the abdomen and pelvis was performed using the standard protocol following bolus administration of intravenous contrast.  CONTRAST:  153mL OMNIPAQUE IOHEXOL 300 MG/ML  SOLN  COMPARISON:  04/03/15  FINDINGS: Lung bases demonstrate mild bibasilar atelectasis. No focal confluent infiltrate is seen. No sizable effusion is noted.  The liver is diffusely decreased in attenuation consistent with fatty infiltration. Some dependent density is noted within the gallbladder likely related to small stones or gallbladder sludge. The spleen, adrenal glands and pancreas are within normal limits. A  nasogastric catheter is noted within the third portion of the duodenum. Kidneys are well visualized and within normal limits. The appendix is within normal limits  Mild ascites is noted predominately within the pelvis. The bladder is decompressed by Foley catheter. No pelvic mass lesion is seen. Mildly dilated small bowel is again identified although no definitive transition point is seen. Diffuse diverticular change is noted without definitive diverticulitis. Diffuse aortoiliac calcifications are noted without aneurysmal dilatation. Mild changes of anasarca are noted in the subcutaneous tissues.  IMPRESSION: Persistent mild small bowel dilatation. No definitive transition zone is seen. No mass lesion is noted.  Diverticulosis without definite diverticulitis.  Mild ascites and changes of anasarca in the subcutaneous tissues.   Electronically Signed   By: Inez Catalina M.D.   On: 04/04/2015 16:07   Dg Chest Port 1 View  04/05/2015   CLINICAL DATA:  Endotracheal tube, right IJ line, NG tube in stable position. Stable mild cardiomegaly.  EXAM: PORTABLE CHEST - 1 VIEW  COMPARISON:  04/04/2015.  04/03/2015.  04/02/2015.  FINDINGS: Endotracheal tube 1.7 cm above the carina. Proximal repositioning of approximately 1 cm should be considered. Right IJ line and NG tube in stable position. Stable mild cardiomegaly. Diffuse unchanged interstitial prominence noted. This could be related to interstitial edema or pneumonitis. Chronic interstitial lung disease cannot be excluded. Left base atelectasis. Small left pleural effusion. No pneumothorax.  IMPRESSION: 1. Endotracheal tube 1.7 cm above the carina. Proximal repositioning of approximately 1 cm suggested. 2. Stable mild cardiomegaly. 3. Diffuse unchanged mild interstitial prominence. These changes could be related to mild interstitial edema and/or pneumonitis. Chronic interstitial lung disease could also present this fashion. Left base atelectasis. Small left pleural  effusion. These results will be called to the ordering clinician or representative by the Radiologist Assistant, and communication documented in the PACS or zVision Dashboard.   Electronically Signed   By: Marcello Moores  Register   On: 04/05/2015 07:40   Dg Chest Port 1 View  04/04/2015   CLINICAL DATA:  Respiratory failure  EXAM: PORTABLE CHEST - 1 VIEW  COMPARISON:  04/03/2015  FINDINGS: Endotracheal tube tip is positioned at the carina. Nasogastric tube tip terminates below the level of the hemidiaphragms but is not included in the field of view. Right IJ central line tip terminates over the upper SVC. Coarsened interstitial markings are reidentified bilaterally with overall low lung volumes but no focal acute finding otherwise. Remote right rib fractures reidentified.  IMPRESSION: Endotracheal tube at the level of the carina, consider pulling back 2 cm for more optimal positioning or repeat imaging to check recurrent position, given the interval since performance of the original exam and dictation of this report.  Stable nonspecific diffusely increased interstitial pattern.  These results were called by telephone at the time of interpretation on 04/04/2015 at 8:12 am to RN Shay for Dr. [name] , who verbally acknowledged these results.   Electronically Signed   By: Conchita Paris M.D.   On: 04/04/2015 08:23   Dg Chest Port 1 View  04/03/2015   CLINICAL DATA:  Endotracheal tube  EXAM: PORTABLE CHEST - 1 VIEW  COMPARISON:  04/02/2015  FINDINGS: New endotracheal tube with tip approximately 2 cm above the carina. An orogastric tube reaches the stomach. Right IJ central line, tip at the mid SVC level.  Stable blunting of the lateral left costophrenic sulcus. Interstitial coarsening and hyperinflation without change. Normal heart size and aortic contours. Remote right rib fractures. No acute osseous finding.  IMPRESSION: 1. New tubes and central line are in unremarkable position, as described above. No  pneumothorax. 2. Trace left pleural effusion. 3. Background COPD.   Electronically Signed   By: Monte Fantasia M.D.   On: 04/03/2015 13:33   Dg Chest Port 1 View  04/03/2015   CLINICAL DATA:  Vomiting.  Possible aspiration.  EXAM: PORTABLE CHEST - 1 VIEW  COMPARISON:  None.  FINDINGS: Low lung volumes. Cardiomegaly. Nonspecific interstitial prominence favored to represent COPD/mild vascular congestion. Blunting LEFT CP angle. RIGHT rib fractures of indeterminate age, but favored to be chronic as there is no history of fall and chest wall pain. Thoracic atherosclerosis.  IMPRESSION: COPD. Small LEFT pleural effusion. Mild vascular congestion not excluded.  No aspiration pneumonia is observed.   Electronically Signed   By: Staci Righter M.D.   On: 04/03/2015 09:32   Dg Chest Port 1 View  03/29/2015   CLINICAL DATA:  Pneumonia, short of breath, unresponsive  EXAM: PORTABLE CHEST - 1 VIEW  COMPARISON:  Chest CT 03/28/2015, CT thorax 03/28/2015  FINDINGS: Normal cardiac silhouette. There is coarsened central bronchovascular markings similar prior. No focal infiltrate. No pneumothorax.  IMPRESSION: Chronic interstitial markings not changed from prior. No acute findings.   Electronically Signed   By: Suzy Bouchard M.D.   On: 03/29/2015 21:57   Dg Abd Portable 1v  04/07/2015   CLINICAL DATA:  Encounter for nasogastric tube placement. Ileus. Also with history of COPD and hypertension.  EXAM: PORTABLE ABDOMEN - 1 VIEW  COMPARISON:  04/06/2015.  FINDINGS: Nasogastric tube passes below the diaphragm. Tip projects in the stomach. Side hole projects just above the gastroesophageal junction. Recommend further insertion of approximate 5 cm to allow more optimal positioning.  There are persistent dilated loops small bowel. There is no colonic distention. Findings may reflect a diffuse adynamic ileus, but could reflect a partial small bowel obstruction. The latter is favored.  IMPRESSION: NG tube tip in the proximal  stomach. Recommend inserting 5 cm to allow the side hole to fully enter the stomach.   Electronically Signed   By: Lajean Manes M.D.   On: 04/07/2015 11:24   Dg Abd Portable 1v  04/06/2015   CLINICAL DATA:  Nausea and vomiting.  EXAM: PORTABLE ABDOMEN - 1 VIEW  COMPARISON:  None.  FINDINGS: Moderately dilated small bowel loops are noted concerning for ileus or distal small bowel obstruction. No radio-opaque calculi or other significant radiographic abnormality are seen.  IMPRESSION: Moderately dilated small bowel loops are noted centrally concerning for ileus or distal small bowel obstruction. Continued follow-up radiographic follow-up is recommended.   Electronically Signed   By: Marijo Conception, M.D.  On: 04/06/2015 16:52   Dg Abd Portable 1v  04/03/2015   CLINICAL DATA:  Tube placement.  EXAM: PORTABLE ABDOMEN - 1 VIEW  COMPARISON:  None.  FINDINGS: An orogastric tube has been placed. The tip of the tube lies within or near the duodenum bulb. Distended loops of small bowel are noted in the mid abdomen suggesting small bowel obstruction. Colonic gas is observed.  IMPRESSION: Satisfactory orogastric tube placement.   Electronically Signed   By: Staci Righter M.D.   On: 04/03/2015 13:33    Microbiology: Recent Results (from the past 240 hour(s))  Culture, blood (routine x 2)     Status: None   Collection Time: 04/03/15  6:26 AM  Result Value Ref Range Status   Specimen Description BLOOD RIGHT ARM  Final   Special Requests BOTTLES DRAWN AEROBIC AND ANAEROBIC 10CC  Final   Culture   Final    NO GROWTH 5 DAYS Performed at [facility]    Report Status 04/08/2015 FINAL  Final  Culture, blood (routine x 2)     Status: None   Collection Time: 04/03/15  6:41 AM  Result Value Ref Range Status   Specimen Description BLOOD RIGHT HAND  Final   Special Requests BOTTLES DRAWN AEROBIC ONLY 10CC  Final   Culture   Final    NO GROWTH 5 DAYS Performed at [facility]    Report Status  04/08/2015 FINAL  Final  Culture, respiratory (NON-Expectorated)     Status: None   Collection Time: 04/03/15  2:51 PM  Result Value Ref Range Status   Specimen Description TRACHEAL ASPIRATE  Final   Special Requests NONE  Final   Gram Stain   Final    FEW WBC PRESENT,BOTH PMN AND MONONUCLEAR NO SQUAMOUS EPITHELIAL CELLS SEEN RARE GRAM NEGATIVE RODS RARE GRAM POSITIVE COCCI IN PAIRS Performed at [facility]    Culture   Final    Non-Pathogenic Oropharyngeal-type Flora Isolated. Performed at [facility]    Report Status 04/06/2015 FINAL  Final  Culture, Urine     Status: None   Collection Time: 04/03/15  2:52 PM  Result Value Ref Range Status   Specimen Description URINE, CATHETERIZED  Final   Special Requests NONE  Final   Culture   Final    >=100,000 COLONIES/mL ESCHERICHIA COLI Performed at [facility]    Report Status 04/05/2015 FINAL  Final   Organism ID, Bacteria ESCHERICHIA COLI  Final      Susceptibility   Escherichia coli - MIC*    AMPICILLIN >=32 RESISTANT Resistant     CEFAZOLIN <=4 SENSITIVE Sensitive     CEFTRIAXONE <=1 SENSITIVE Sensitive     CIPROFLOXACIN >=4 RESISTANT Resistant     GENTAMICIN <=1 SENSITIVE Sensitive     IMIPENEM <=0.25 SENSITIVE Sensitive     NITROFURANTOIN <=16 SENSITIVE Sensitive     TRIMETH/SULFA <=20 SENSITIVE Sensitive     AMPICILLIN/SULBACTAM 16 INTERMEDIATE Intermediate     PIP/TAZO <=4 SENSITIVE Sensitive     * >=100,000 COLONIES/mL ESCHERICHIA COLI     Labs: Basic Metabolic Panel:  Recent Labs Lab 04/03/15 0404 04/04/15 0450 04/04/15 2015 04/05/15 0350 04/06/15 0442 04/08/15 0525 04/09/15 0530  NA 137 135  --  136 137 139 138  K 3.4* 3.9  --  3.9 3.8 3.1* 3.6  CL 104 108  --  108 106 104 104  CO2 23 20*  --  22 24 27 26   GLUCOSE 142* 201*  --  133* 96 76 78  BUN 14 18  --  15 13 13 11   CREATININE 0.70 0.85  --  0.72 0.72 0.43* 0.46  CALCIUM 9.2 7.9*  --  8.1* 8.8* 8.4* 8.0*  MG 1.5* 1.3*  2.5* 2.2  --   --  2.0   Liver Function Tests:  Recent Labs Lab 04/06/15 0442  AST 16  ALT 19  ALKPHOS 51  BILITOT 0.7  PROT 5.5*  ALBUMIN 2.5*   No results for input(s): LIPASE, AMYLASE in the last 168 hours. No results for input(s): AMMONIA in the last 168 hours. CBC:  Recent Labs Lab 04/04/15 0450 04/05/15 0350 04/06/15 0442  WBC 13.1* 10.3 8.8  HGB 11.5* 10.6* 10.3*  HCT 36.0 32.9* 31.4*  MCV 91.6 91.1 90.5  PLT 239 236 184   Cardiac Enzymes: No results for input(s): CKTOTAL, CKMB, CKMBINDEX, TROPONINI in the last 168 hours. BNP: BNP (last 3 results)  Recent Labs  03/28/15 1056 04/03/15 1445  BNP 70.7 82.3    ProBNP (last 3 results) No results for input(s): PROBNP in the last 8760 hours.  CBG:  Recent Labs Lab 04/08/15 1246 04/08/15 1642 04/08/15 2125 04/08/15 2318 04/09/15 0744  GLUCAP 94 92 64* 109* 94       Signed:  FELIZ ORTIZ, ABRAHAM  [facility] 04/09/2015, 9:40 AM

## 2015-04-09 NOTE — Clinical Social Work Placement (Signed)
   CLINICAL SOCIAL WORK PLACEMENT  NOTE  Date:  04/09/2015  Patient Details  Name: Beth Meyers MRN: [id number] Date of Birth: 1951/04/28  Clinical Social Work is seeking post-discharge placement for this patient at the [place] level of care (*CSW will initial, date and re-position this form in  chart as items are completed):  Yes   Patient/family provided with [facility] Work Department's list of facilities offering this level of care within the geographic area requested by the patient (or if unable, by the patient's family).  Yes   Patient/family informed of their freedom to choose among providers that offer the needed level of care, that participate in Medicare, Medicaid or managed care program needed by the patient, have an available bed and are willing to accept the patient.  Yes   Patient/family informed of [facility]'s ownership interest in [facility] and [facility], as well as of the fact that they are under no obligation to receive care at these facilities.  PASRR submitted to EDS on 04/08/15     PASRR number received on 04/09/15     Existing PASRR number confirmed on       FL2 transmitted to all facilities in geographic area requested by pt/family on 04/08/15     FL2 transmitted to all facilities within larger geographic area on       Patient informed that his/her managed care company has contracts with or will negotiate with certain facilities, including the following:        Yes   Patient/family informed of bed offers received.  Patient chooses bed at [facility]     Physician recommends and patient chooses bed at      Patient to be transferred to [facility] on 04/09/15.  Patient to be transferred to facility by [facility]     Patient family notified on 04/09/15 of transfer.  Name of family member notified:  Husband and dtr at bedside     PHYSICIAN       Additional Comment:     _______________________________________________ Boone Master, [place] 04/09/2015, 11:26 AM

## 2015-04-09 NOTE — Care Management Important Message (Signed)
Important Message  Patient Details IM Letter given to Kathy/Case Manager to present to Patient Name: Beth Meyers MRN: [id number] Date of Birth: [birth year]   Medicare Important Message Given:  Yes-second notification given    Camillo Flaming 04/09/2015, 12:04 PM

## 2015-04-09 NOTE — Progress Notes (Signed)
Report called to Ned Card, RN at [facility]. Pt waiting for [facility] for transport.

## 2015-04-09 NOTE — Progress Notes (Signed)
Clinical Social Work  CSW faxed DC summary to [facility] who is agreeable to accept patient today. MD reports no need for isolation due to no recent stools. CSW shared information with SNF. CSW prepared DC packet with FL2, DC summary and chart copy included. Patient's family to go complete paperwork. RN to call report. [facility] arranged for 4pm. Request #: [id number].  CSW is signing off but available if needed.  Beth Meyers, Beth Meyers [phone]

## 2018-08-02 ENCOUNTER — Encounter (HOSPITAL_COMMUNITY): Payer: Self-pay

## 2018-08-02 ENCOUNTER — Emergency Department (HOSPITAL_COMMUNITY): Payer: Medicare HMO

## 2018-08-02 ENCOUNTER — Other Ambulatory Visit: Payer: Self-pay

## 2018-08-02 ENCOUNTER — Inpatient Hospital Stay (HOSPITAL_COMMUNITY)
Admission: EM | Admit: 2018-08-02 | Discharge: 2018-08-07 | DRG: 871 | Disposition: A | Discharge status: Hospice | Payer: Medicare HMO | Attending: Internal Medicine | Admitting: Internal Medicine

## 2018-08-02 DIAGNOSIS — C189 Malignant neoplasm of colon, unspecified: Secondary | ICD-10-CM | POA: Diagnosis present

## 2018-08-02 DIAGNOSIS — F1021 Alcohol dependence, in remission: Secondary | ICD-10-CM | POA: Diagnosis present

## 2018-08-02 DIAGNOSIS — Z7189 Other specified counseling: Secondary | ICD-10-CM

## 2018-08-02 DIAGNOSIS — K219 Gastro-esophageal reflux disease without esophagitis: Secondary | ICD-10-CM | POA: Diagnosis present

## 2018-08-02 DIAGNOSIS — Z8249 Family history of ischemic heart disease and other diseases of the circulatory system: Secondary | ICD-10-CM

## 2018-08-02 DIAGNOSIS — Z87440 Personal history of urinary (tract) infections: Secondary | ICD-10-CM

## 2018-08-02 DIAGNOSIS — J181 Lobar pneumonia, unspecified organism: Secondary | ICD-10-CM | POA: Diagnosis present

## 2018-08-02 DIAGNOSIS — J440 Chronic obstructive pulmonary disease with acute lower respiratory infection: Secondary | ICD-10-CM | POA: Diagnosis present

## 2018-08-02 DIAGNOSIS — Z87891 Personal history of nicotine dependence: Secondary | ICD-10-CM

## 2018-08-02 DIAGNOSIS — Z681 Body mass index (BMI) 19 or less, adult: Secondary | ICD-10-CM

## 2018-08-02 DIAGNOSIS — A419 Sepsis, unspecified organism: Principal | ICD-10-CM | POA: Diagnosis present

## 2018-08-02 DIAGNOSIS — R109 Unspecified abdominal pain: Secondary | ICD-10-CM | POA: Diagnosis present

## 2018-08-02 DIAGNOSIS — J441 Chronic obstructive pulmonary disease with (acute) exacerbation: Secondary | ICD-10-CM | POA: Diagnosis present

## 2018-08-02 DIAGNOSIS — D649 Anemia, unspecified: Secondary | ICD-10-CM | POA: Diagnosis present

## 2018-08-02 DIAGNOSIS — S30814A Abrasion of vagina and vulva, initial encounter: Secondary | ICD-10-CM | POA: Diagnosis present

## 2018-08-02 DIAGNOSIS — S70319A Abrasion, unspecified thigh, initial encounter: Secondary | ICD-10-CM | POA: Diagnosis present

## 2018-08-02 DIAGNOSIS — E876 Hypokalemia: Secondary | ICD-10-CM

## 2018-08-02 DIAGNOSIS — N321 Vesicointestinal fistula: Secondary | ICD-10-CM | POA: Diagnosis present

## 2018-08-02 DIAGNOSIS — F1010 Alcohol abuse, uncomplicated: Secondary | ICD-10-CM | POA: Diagnosis present

## 2018-08-02 DIAGNOSIS — D630 Anemia in neoplastic disease: Secondary | ICD-10-CM | POA: Diagnosis present

## 2018-08-02 DIAGNOSIS — Z7401 Bed confinement status: Secondary | ICD-10-CM

## 2018-08-02 DIAGNOSIS — E785 Hyperlipidemia, unspecified: Secondary | ICD-10-CM | POA: Diagnosis present

## 2018-08-02 DIAGNOSIS — Z515 Encounter for palliative care: Secondary | ICD-10-CM | POA: Diagnosis not present

## 2018-08-02 DIAGNOSIS — Z880 Allergy status to penicillin: Secondary | ICD-10-CM

## 2018-08-02 DIAGNOSIS — F1027 Alcohol dependence with alcohol-induced persisting dementia: Secondary | ICD-10-CM | POA: Diagnosis present

## 2018-08-02 DIAGNOSIS — R64 Cachexia: Secondary | ICD-10-CM | POA: Diagnosis present

## 2018-08-02 DIAGNOSIS — J189 Pneumonia, unspecified organism: Secondary | ICD-10-CM

## 2018-08-02 DIAGNOSIS — R627 Adult failure to thrive: Secondary | ICD-10-CM

## 2018-08-02 DIAGNOSIS — E43 Unspecified severe protein-calorie malnutrition: Secondary | ICD-10-CM | POA: Diagnosis present

## 2018-08-02 DIAGNOSIS — I10 Essential (primary) hypertension: Secondary | ICD-10-CM | POA: Diagnosis present

## 2018-08-02 DIAGNOSIS — Z825 Family history of asthma and other chronic lower respiratory diseases: Secondary | ICD-10-CM

## 2018-08-02 DIAGNOSIS — Z66 Do not resuscitate: Secondary | ICD-10-CM | POA: Diagnosis present

## 2018-08-02 DIAGNOSIS — C679 Malignant neoplasm of bladder, unspecified: Secondary | ICD-10-CM | POA: Diagnosis present

## 2018-08-02 DIAGNOSIS — E871 Hypo-osmolality and hyponatremia: Secondary | ICD-10-CM | POA: Diagnosis present

## 2018-08-02 DIAGNOSIS — Z888 Allergy status to other drugs, medicaments and biological substances status: Secondary | ICD-10-CM

## 2018-08-02 LAB — CBC WITH DIFFERENTIAL/PLATELET
Abs Immature Granulocytes: 0.09 10*3/uL — ABNORMAL HIGH (ref 0.00–0.07)
Basophils Absolute: 0 10*3/uL (ref 0.0–0.1)
Basophils Relative: 0 %
Eosinophils Absolute: 0 10*3/uL (ref 0.0–0.5)
Eosinophils Relative: 0 %
HCT: 29.6 % — ABNORMAL LOW (ref 36.0–46.0)
Hemoglobin: 8.2 g/dL — ABNORMAL LOW (ref 12.0–15.0)
Immature Granulocytes: 1 %
Lymphocytes Relative: 10 %
Lymphs Abs: 1.4 10*3/uL (ref 0.7–4.0)
MCH: 23.6 pg — ABNORMAL LOW (ref 26.0–34.0)
MCHC: 27.7 g/dL — ABNORMAL LOW (ref 30.0–36.0)
MCV: 85.1 fL (ref 80.0–100.0)
Monocytes Absolute: 1 10*3/uL (ref 0.1–1.0)
Monocytes Relative: 7 %
Neutro Abs: 12 10*3/uL — ABNORMAL HIGH (ref 1.7–7.7)
Neutrophils Relative %: 82 %
Platelets: 364 10*3/uL (ref 150–400)
RBC: 3.48 MIL/uL — ABNORMAL LOW (ref 3.87–5.11)
RDW: 21.3 % — ABNORMAL HIGH (ref 11.5–15.5)
WBC Morphology: INCREASED
WBC: 14.6 10*3/uL — ABNORMAL HIGH (ref 4.0–10.5)
nRBC: 0 % (ref 0.0–0.2)

## 2018-08-02 LAB — COMPREHENSIVE METABOLIC PANEL
ALT: 6 U/L (ref 0–44)
AST: 10 U/L — ABNORMAL LOW (ref 15–41)
Albumin: 3.1 g/dL — ABNORMAL LOW (ref 3.5–5.0)
Alkaline Phosphatase: 50 U/L (ref 38–126)
Anion gap: 15 (ref 5–15)
BUN: 15 mg/dL (ref 8–23)
CO2: 26 mmol/L (ref 22–32)
Calcium: 8.7 mg/dL — ABNORMAL LOW (ref 8.9–10.3)
Chloride: 90 mmol/L — ABNORMAL LOW (ref 98–111)
Creatinine, Ser: 0.68 mg/dL (ref 0.44–1.00)
GFR calc Af Amer: >60 mL/min (ref >60)
GFR calc non Af Amer: >60 mL/min (ref >60)
Glucose, Bld: 98 mg/dL (ref 70–99)
Potassium: 2.8 mmol/L — ABNORMAL LOW (ref 3.5–5.1)
Sodium: 131 mmol/L — ABNORMAL LOW (ref 135–145)
Total Bilirubin: 2 mg/dL — ABNORMAL HIGH (ref 0.3–1.2)
Total Protein: 6.6 g/dL (ref 6.5–8.1)

## 2018-08-02 MED ORDER — POTASSIUM CHLORIDE 10 MEQ/100ML IV SOLN
10.0000 meq | INTRAVENOUS | Status: AC
  Administered 2018-08-03 (×2): 10 meq via INTRAVENOUS
  Filled 2018-08-02: qty 100

## 2018-08-02 MED ORDER — SODIUM CHLORIDE 0.9 % IV SOLN
500.0000 mg | Freq: Once | INTRAVENOUS | Status: AC
  Administered 2018-08-03: 500 mg via INTRAVENOUS
  Filled 2018-08-02: qty 500

## 2018-08-02 MED ORDER — HYDROMORPHONE HCL 1 MG/ML IJ SOLN
0.5000 mg | Freq: Once | INTRAMUSCULAR | Status: AC
  Administered 2018-08-02: 0.5 mg via INTRAVENOUS
  Filled 2018-08-02: qty 1

## 2018-08-02 MED ORDER — IOHEXOL 300 MG/ML  SOLN
100.0000 mL | Freq: Once | INTRAMUSCULAR | Status: AC | PRN
  Administered 2018-08-02: 100 mL via INTRAVENOUS

## 2018-08-02 MED ORDER — SODIUM CHLORIDE 0.9 % IV SOLN
1.0000 g | Freq: Once | INTRAVENOUS | Status: AC
  Administered 2018-08-02: 1 g via INTRAVENOUS
  Filled 2018-08-02: qty 10

## 2018-08-02 MED ORDER — POTASSIUM CHLORIDE CRYS ER 20 MEQ PO TBCR
20.0000 meq | EXTENDED_RELEASE_TABLET | Freq: Once | ORAL | Status: DC
  Filled 2018-08-02: qty 1

## 2018-08-02 MED ORDER — LACTATED RINGERS IV BOLUS
1000.0000 mL | Freq: Once | INTRAVENOUS | Status: AC
  Administered 2018-08-02: 1000 mL via INTRAVENOUS

## 2018-08-02 MED ORDER — LORAZEPAM 2 MG/ML IJ SOLN
0.5000 mg | Freq: Once | INTRAMUSCULAR | Status: DC
  Filled 2018-08-02: qty 1

## 2018-08-02 NOTE — ED Triage Notes (Signed)
Per ems pt c/o genital pain and weakness. Family reports pt not eating x 3 days. Pt has bladder and colon cancer.

## 2018-08-02 NOTE — ED Notes (Signed)
Bladder scanned Pt. Multiple Times. Pt continuously had 51mls

## 2018-08-03 ENCOUNTER — Encounter (HOSPITAL_COMMUNITY): Payer: Self-pay | Admitting: Internal Medicine

## 2018-08-03 DIAGNOSIS — F1027 Alcohol dependence with alcohol-induced persisting dementia: Secondary | ICD-10-CM | POA: Diagnosis present

## 2018-08-03 DIAGNOSIS — J440 Chronic obstructive pulmonary disease with acute lower respiratory infection: Secondary | ICD-10-CM | POA: Diagnosis present

## 2018-08-03 DIAGNOSIS — F1010 Alcohol abuse, uncomplicated: Secondary | ICD-10-CM | POA: Diagnosis present

## 2018-08-03 DIAGNOSIS — N321 Vesicointestinal fistula: Secondary | ICD-10-CM | POA: Diagnosis present

## 2018-08-03 DIAGNOSIS — J181 Lobar pneumonia, unspecified organism: Secondary | ICD-10-CM | POA: Diagnosis present

## 2018-08-03 DIAGNOSIS — Z66 Do not resuscitate: Secondary | ICD-10-CM | POA: Diagnosis present

## 2018-08-03 DIAGNOSIS — E785 Hyperlipidemia, unspecified: Secondary | ICD-10-CM | POA: Diagnosis present

## 2018-08-03 DIAGNOSIS — F1021 Alcohol dependence, in remission: Secondary | ICD-10-CM | POA: Diagnosis present

## 2018-08-03 DIAGNOSIS — A419 Sepsis, unspecified organism: Secondary | ICD-10-CM | POA: Diagnosis present

## 2018-08-03 DIAGNOSIS — C679 Malignant neoplasm of bladder, unspecified: Secondary | ICD-10-CM | POA: Diagnosis present

## 2018-08-03 DIAGNOSIS — D649 Anemia, unspecified: Secondary | ICD-10-CM | POA: Diagnosis present

## 2018-08-03 DIAGNOSIS — R627 Adult failure to thrive: Secondary | ICD-10-CM | POA: Insufficient documentation

## 2018-08-03 DIAGNOSIS — Z7401 Bed confinement status: Secondary | ICD-10-CM | POA: Diagnosis not present

## 2018-08-03 DIAGNOSIS — E43 Unspecified severe protein-calorie malnutrition: Secondary | ICD-10-CM | POA: Diagnosis present

## 2018-08-03 DIAGNOSIS — E871 Hypo-osmolality and hyponatremia: Secondary | ICD-10-CM | POA: Diagnosis present

## 2018-08-03 DIAGNOSIS — J441 Chronic obstructive pulmonary disease with (acute) exacerbation: Secondary | ICD-10-CM | POA: Diagnosis present

## 2018-08-03 DIAGNOSIS — I10 Essential (primary) hypertension: Secondary | ICD-10-CM | POA: Diagnosis present

## 2018-08-03 DIAGNOSIS — R64 Cachexia: Secondary | ICD-10-CM | POA: Diagnosis present

## 2018-08-03 DIAGNOSIS — K219 Gastro-esophageal reflux disease without esophagitis: Secondary | ICD-10-CM | POA: Diagnosis present

## 2018-08-03 DIAGNOSIS — D630 Anemia in neoplastic disease: Secondary | ICD-10-CM | POA: Diagnosis present

## 2018-08-03 DIAGNOSIS — E876 Hypokalemia: Secondary | ICD-10-CM | POA: Diagnosis present

## 2018-08-03 DIAGNOSIS — Z681 Body mass index (BMI) 19 or less, adult: Secondary | ICD-10-CM | POA: Diagnosis not present

## 2018-08-03 DIAGNOSIS — C189 Malignant neoplasm of colon, unspecified: Secondary | ICD-10-CM | POA: Diagnosis present

## 2018-08-03 DIAGNOSIS — S30814A Abrasion of vagina and vulva, initial encounter: Secondary | ICD-10-CM | POA: Diagnosis present

## 2018-08-03 DIAGNOSIS — S70319A Abrasion, unspecified thigh, initial encounter: Secondary | ICD-10-CM | POA: Diagnosis present

## 2018-08-03 DIAGNOSIS — Z7189 Other specified counseling: Secondary | ICD-10-CM | POA: Diagnosis not present

## 2018-08-03 DIAGNOSIS — R109 Unspecified abdominal pain: Secondary | ICD-10-CM | POA: Diagnosis present

## 2018-08-03 DIAGNOSIS — R1030 Lower abdominal pain, unspecified: Secondary | ICD-10-CM | POA: Diagnosis not present

## 2018-08-03 DIAGNOSIS — Z515 Encounter for palliative care: Secondary | ICD-10-CM | POA: Diagnosis not present

## 2018-08-03 DIAGNOSIS — J189 Pneumonia, unspecified organism: Secondary | ICD-10-CM | POA: Diagnosis not present

## 2018-08-03 LAB — RETICULOCYTES
IMMATURE RETIC FRACT: 15.5 % (ref 2.3–15.9)
RBC.: 2.67 MIL/uL — ABNORMAL LOW (ref 3.87–5.11)
Retic Count, Absolute: 125.5 10*3/uL (ref 19.0–186.0)
Retic Ct Pct: 4.7 % — ABNORMAL HIGH (ref 0.4–3.1)

## 2018-08-03 LAB — VITAMIN B12: Vitamin B-12: 442 pg/mL (ref 180–914)

## 2018-08-03 LAB — MAGNESIUM: Magnesium: 1.5 mg/dL — ABNORMAL LOW (ref 1.7–2.4)

## 2018-08-03 LAB — FOLATE: Folate: 6.4 ng/mL (ref >5.9)

## 2018-08-03 LAB — FERRITIN: Ferritin: 55 ng/mL (ref 11–307)

## 2018-08-03 LAB — IRON AND TIBC
Iron: 18 ug/dL — ABNORMAL LOW (ref 28–170)
Saturation Ratios: 9 % — ABNORMAL LOW (ref 10.4–31.8)
TIBC: 195 ug/dL — ABNORMAL LOW (ref 250–450)
UIBC: 177 ug/dL

## 2018-08-03 LAB — LACTIC ACID, PLASMA
Lactic Acid, Venous: 1.2 mmol/L (ref 0.5–1.9)
Lactic Acid, Venous: 1.3 mmol/L (ref 0.5–1.9)

## 2018-08-03 LAB — PROTIME-INR
INR: 1.18
Prothrombin Time: 14.9 seconds (ref 11.4–15.2)

## 2018-08-03 LAB — PROCALCITONIN: Procalcitonin: <0.1 ng/mL

## 2018-08-03 LAB — HIV ANTIBODY (ROUTINE TESTING W REFLEX): HIV Screen 4th Generation wRfx: NONREACTIVE

## 2018-08-03 LAB — APTT: aPTT: 39 seconds — ABNORMAL HIGH (ref 24–36)

## 2018-08-03 MED ORDER — ONDANSETRON HCL 4 MG/2ML IJ SOLN: 4.0000 mg | Freq: Three times a day (TID) | INTRAMUSCULAR | Status: DC | PRN

## 2018-08-03 MED ORDER — OXYCODONE-ACETAMINOPHEN 5-325 MG PO TABS
1.0000 | ORAL_TABLET | ORAL | Status: DC | PRN
  Administered 2018-08-03 – 2018-08-05 (×10): 1 via ORAL
  Filled 2018-08-03 (×10): qty 1

## 2018-08-03 MED ORDER — HYDROMORPHONE HCL 1 MG/ML IJ SOLN: 0.5000 mg | Freq: Once | INTRAMUSCULAR | Status: DC

## 2018-08-03 MED ORDER — DM-GUAIFENESIN ER 30-600 MG PO TB12: 1.0000 | ORAL_TABLET | Freq: Two times a day (BID) | ORAL | Status: DC | PRN

## 2018-08-03 MED ORDER — SODIUM CHLORIDE 0.9 % IV SOLN
INTRAVENOUS | Status: DC
  Administered 2018-08-03 (×2): via INTRAVENOUS

## 2018-08-03 MED ORDER — HYDRALAZINE HCL 20 MG/ML IJ SOLN: 5.0000 mg | INTRAMUSCULAR | Status: DC | PRN

## 2018-08-03 MED ORDER — SODIUM CHLORIDE 0.9 % IV BOLUS
500.0000 mL | Freq: Once | INTRAVENOUS | Status: AC
  Administered 2018-08-03: 500 mL via INTRAVENOUS

## 2018-08-03 MED ORDER — POTASSIUM CHLORIDE 20 MEQ/15ML (10%) PO SOLN
40.0000 meq | Freq: Once | ORAL | Status: AC
  Administered 2018-08-03: 40 meq via ORAL
  Filled 2018-08-03: qty 30

## 2018-08-03 MED ORDER — SODIUM CHLORIDE 0.9 % IV SOLN
1.0000 g | INTRAVENOUS | Status: DC
  Administered 2018-08-03 – 2018-08-04 (×2): 1 g via INTRAVENOUS
  Filled 2018-08-03 (×2): qty 10

## 2018-08-03 MED ORDER — ZOLPIDEM TARTRATE 5 MG PO TABS: 5.0000 mg | ORAL_TABLET | Freq: Every evening | ORAL | Status: DC | PRN

## 2018-08-03 MED ORDER — FENTANYL 12 MCG/HR TD PT72
25.0000 ug | MEDICATED_PATCH | TRANSDERMAL | Status: DC
  Administered 2018-08-03 – 2018-08-06 (×2): 25 ug via TRANSDERMAL
  Filled 2018-08-03 (×2): qty 2

## 2018-08-03 MED ORDER — POTASSIUM CHLORIDE CRYS ER 20 MEQ PO TBCR
40.0000 meq | EXTENDED_RELEASE_TABLET | Freq: Once | ORAL | Status: AC
  Administered 2018-08-03: 40 meq via ORAL
  Filled 2018-08-03: qty 2

## 2018-08-03 MED ORDER — ENSURE ENLIVE PO LIQD
237.0000 mL | Freq: Two times a day (BID) | ORAL | Status: DC
  Administered 2018-08-03 – 2018-08-06 (×3): 237 mL via ORAL

## 2018-08-03 MED ORDER — HEPARIN SODIUM (PORCINE) 5000 UNIT/ML IJ SOLN
5000.0000 [IU] | Freq: Three times a day (TID) | INTRAMUSCULAR | Status: DC
  Administered 2018-08-03 – 2018-08-05 (×7): 5000 [IU] via SUBCUTANEOUS
  Filled 2018-08-03 (×7): qty 1

## 2018-08-03 MED ORDER — HYDROMORPHONE HCL 1 MG/ML IJ SOLN
1.0000 mg | INTRAMUSCULAR | Status: DC | PRN
  Administered 2018-08-03 – 2018-08-05 (×6): 1 mg via INTRAVENOUS
  Filled 2018-08-03 (×6): qty 1

## 2018-08-03 MED ORDER — POTASSIUM CHLORIDE 10 MEQ/100ML IV SOLN
10.0000 meq | INTRAVENOUS | Status: DC
  Filled 2018-08-03: qty 100

## 2018-08-03 MED ORDER — ACETAMINOPHEN 325 MG PO TABS
650.0000 mg | ORAL_TABLET | Freq: Four times a day (QID) | ORAL | Status: DC | PRN
  Administered 2018-08-07: 650 mg via ORAL
  Filled 2018-08-03 (×2): qty 2

## 2018-08-03 MED ORDER — MORPHINE SULFATE (PF) 2 MG/ML IV SOLN
1.0000 mg | INTRAVENOUS | Status: DC | PRN
  Administered 2018-08-03 (×2): 1 mg via INTRAVENOUS
  Filled 2018-08-03 (×2): qty 1

## 2018-08-03 MED ORDER — MAGNESIUM SULFATE 2 GM/50ML IV SOLN
2.0000 g | Freq: Once | INTRAVENOUS | Status: AC
  Administered 2018-08-03: 2 g via INTRAVENOUS
  Filled 2018-08-03: qty 50

## 2018-08-03 MED ORDER — ALBUTEROL SULFATE (2.5 MG/3ML) 0.083% IN NEBU: 2.5000 mg | INHALATION_SOLUTION | RESPIRATORY_TRACT | Status: DC | PRN

## 2018-08-03 MED ORDER — PHENAZOPYRIDINE HCL 200 MG PO TABS
200.0000 mg | ORAL_TABLET | Freq: Three times a day (TID) | ORAL | Status: DC | PRN
  Administered 2018-08-03: 200 mg via ORAL
  Filled 2018-08-03 (×2): qty 1

## 2018-08-03 MED ORDER — SODIUM CHLORIDE 0.9 % IV SOLN
500.0000 mg | INTRAVENOUS | Status: DC
  Administered 2018-08-03 – 2018-08-04 (×2): 500 mg via INTRAVENOUS
  Filled 2018-08-03 (×2): qty 500

## 2018-08-03 NOTE — H&P (Signed)
History and Physical    Beth Meyers ZOX:[id number] DOB: Aug 06, 1950 DOA: 08/02/2018  Referring MD/NP/PA:   PCP: Christain [place], MD   Patient coming from:  The patient is coming from home.  At baseline, pt is independent for most of ADL.        Chief Complaint: Shortness of breath, cough, abdominal pain, pelvic pain  HPI: Beth Meyers is a 68 y.o. female with medical history significant of hypertension, COPD, depression, anxiety, alcohol abuse in remission, possible bladder and colon cancer, who presents with shortness breath, cough, abdominal pain, pelvic pain.  Pt is poor historian.  Family reported to ED physician that patient has history of bladder and colon cancer. It was diagnosed by Dr. Gaynelle Arabian, urology, several years ago. Family states that she had a cystoscopy. I did Chart Review, but cannot find clear record for this in Epic. Patient elected not to pursue treatment and essentially hasn't had follow-up. Pt has a chronic abdominal pain, which has worsened recently.  She has pain in lower abdomen, pelvis and vaginal area.  No active nausea, vomiting or diarrhea.  She states she has burning on urination all the time.  No fever or chills.  In the past several days, patient developed cough and shortness of breath.  She does not have chest pain, fever or chills.  She coughs up clear mucus.  No unilateral weakness. Per ED RN, when she tried to collect urine samples, pt was found to have mixture of urine and feces coming from vagina.  "Patient is very excoriated on the outside and inside of her labial folds.  Skin protectant placed on outside of folds".   ED Course: pt was found to have WBC 14.6, lactic acid 1.3, potassium 2.8, magnesium 1.5, creatinine and BUN normal, temperature normal, tachycardia, tachypnea, oxygen saturation 90 to 97% on room air.  Chest x-ray showed right base infiltration. Pt is admitted to tele bed as inpt.  CT abdomen/pelvis: 1. Marked masslike wall thickening  of the sigmoid colon which is contiguous with inflammatory soft tissue mass involving the roof and anterior aspect of the urinary bladder; fluid level within the urinary bladder, consistent with colovesical fistula, worsened since prior study. Now seen is large volume of air and stool material within the uterus and vagina, also consistent with a fistula between the inflammatory sigmoid mass and the uterus and or vagina. Overall the findings have progressed since the prior examination. 2. Partial consolidations within the posterior lower lobes which may reflect atelectasis, pneumonia, or aspiration  Review of Systems:   General: no fevers, chills, no body weight gain, has poor appetite, has fatigue HEENT: no blurry vision, hearing changes or sore throat Respiratory: has dyspnea, coughing, no wheezing CV: no chest pain, no palpitations GI: no nausea, vomiting, has abdominal pain, no diarrhea, constipation GU: no dysuria, has burning on urination, no increased urinary frequency, hematuria  Ext: no leg edema Neuro: no unilateral weakness, numbness, or tingling, no vision change or hearing loss Skin: no rash, no skin tear. MSK: No muscle spasm, no deformity, no limitation of range of movement in spin Heme: No easy bruising.  Travel history: No recent long distant travel.  Allergy:  Allergies  Allergen Reactions  . Penicillins Hives    .DID THE REACTION INVOLVE: Swelling of the face/tongue/throat, SOB, or low BP? Unknown Sudden or severe rash/hives, skin peeling, or the inside of the mouth or nose? Unknown Did it require medical treatment? Unknown When did it last happen?more than 10  years If all above answers are "NO", may proceed with cephalosporin use.   . Rosuvastatin Rash    Unknown    Past Medical History:  Diagnosis Date  . Anxiety   . COPD (chronic obstructive pulmonary disease) ([place])   . Depression   . GERD (gastroesophageal reflux disease)   . Hypertension   . Lumbago      History reviewed. No pertinent surgical history.  Social History:  reports that she quit smoking about 26 years ago. Her smoking use included cigarettes. She has a 270.00 pack-year smoking history. She has never used smokeless tobacco. She reports current alcohol use. She reports that she does not use drugs.  Family History:  Family History  Problem Relation Age of Onset  . Emphysema Brother   . Asthma Brother   . Heart disease Father   . Heart disease Brother      Prior to Admission medications   Medication Sig Start Date End Date Taking? Authorizing Provider  phenazopyridine (PYRIDIUM) 200 MG tablet Take 200 mg by mouth 3 (three) times daily as needed for pain.   Yes [provider]    Physical Exam: Vitals:   08/03/18 0100 08/03/18 0130 08/03/18 0231 08/03/18 0345  BP: 106/62 112/65 109/75   Pulse: 96 93 98   Resp: 15 16    Temp:   98.2 F (36.8 C)   TempSrc:   Oral   SpO2: 92% 98% 95%   Weight:    41.4 kg  Height:       General: Not in acute distress.  Cachectic looking. HEENT:       Eyes: PERRL, EOMI, no scleral icterus.       ENT: No discharge from the ears and nose, no pharynx injection, no tonsillar enlargement.        Neck: No JVD, no bruit, no mass felt. Heme: No neck lymph node enlargement. Cardiac: S1/S2, RRR, No murmurs, No gallops or rubs. Respiratory: No rales, wheezing, rhonchi or rubs. GI: Soft, nondistended, has tenderness in lower abdomen, no rebound pain, no organomegaly, BS present. GU: No hematuria Ext: No pitting leg edema bilaterally. 2+DP/PT pulse bilaterally. Musculoskeletal: No joint deformities, No joint redness or warmth, no limitation of ROM in spin. Skin: No rashes.  Neuro: Alert, oriented X3, cranial nerves II-XII grossly intact, moves all extremities normally.  Psych: Patient is not psychotic, no suicidal or hemocidal ideation.  Labs on Admission: I have personally reviewed following labs and imaging  studies  CBC: Recent Labs  Lab 08/02/18 2151  WBC 14.6*  NEUTROABS 12.0*  HGB 8.2*  HCT 29.6*  MCV 85.1  PLT 295   Basic Metabolic Panel: Recent Labs  Lab 08/02/18 2151 08/02/18 2352  NA 131*  --   K 2.8*  --   CL 90*  --   CO2 26  --   GLUCOSE 98  --   BUN 15  --   CREATININE 0.68  --   CALCIUM 8.7*  --   MG  --  1.5*   GFR: Estimated Creatinine Clearance: 43.4 mL/min (by C-G formula based on SCr of 0.68 mg/dL). Liver Function Tests: Recent Labs  Lab 08/02/18 2151  AST 10*  ALT 6  ALKPHOS 50  BILITOT 2.0*  PROT 6.6  ALBUMIN 3.1*   No results for input(s): LIPASE, AMYLASE in the last 168 hours. No results for input(s): AMMONIA in the last 168 hours. Coagulation Profile: Recent Labs  Lab 08/03/18 0123  INR 1.18   Cardiac  Enzymes: No results for input(s): CKTOTAL, CKMB, CKMBINDEX, TROPONINI in the last 168 hours. BNP (last 3 results) No results for input(s): PROBNP in the last 8760 hours. HbA1C: No results for input(s): HGBA1C in the last 72 hours. CBG: No results for input(s): GLUCAP in the last 168 hours. Lipid Profile: No results for input(s): CHOL, HDL, LDLCALC, TRIG, CHOLHDL, LDLDIRECT in the last 72 hours. Thyroid Function Tests: No results for input(s): TSH, T4TOTAL, FREET4, T3FREE, THYROIDAB in the last 72 hours. Anemia Panel: Recent Labs    08/03/18 0123 08/03/18 0124  VITAMINB12 442  --   FOLATE  --  6.4  FERRITIN 55  --   TIBC 195*  --   IRON 18*  --   RETICCTPCT 4.7*  --    Urine analysis:    Component Value Date/Time   COLORURINE RED (A) 04/03/2015 1453   APPEARANCEUR TURBID (A) 04/03/2015 1453   LABSPEC 1.034 (H) 04/03/2015 1453   PHURINE 5.0 04/03/2015 1453   GLUCOSEU NEGATIVE 04/03/2015 1453   HGBUR LARGE (A) 04/03/2015 1453   BILIRUBINUR LARGE (A) 04/03/2015 1453   KETONESUR NEGATIVE 04/03/2015 1453   PROTEINUR >300 (A) 04/03/2015 1453   UROBILINOGEN 1.0 04/03/2015 1453   NITRITE POSITIVE (A) 04/03/2015 1453    LEUKOCYTESUR MODERATE (A) 04/03/2015 1453   Sepsis Labs: @LABRCNTIP (procalcitonin:4,lacticidven:4) )No results found for this or any previous visit (from the past 240 hour(s)).   Radiological Exams on Admission: Dg Chest 2 View  Result Date: 08/02/2018 CLINICAL DATA:  Chronic cough EXAM: CHEST - 2 VIEW COMPARISON:  None. FINDINGS: Mild posterior lung base opacity, possible mild pneumonia. No pleural effusion. Normal heart size. Aortic atherosclerosis. Multiple right upper rib fractures old. Multiple mild compression deformities of the mid to lower thoracic spine. IMPRESSION: Mild patchy posterior lung opacity, possibly the right base, may reflect small pneumonia. Imaging follow-up to resolution is recommended. Electronically Signed   By: Donavan Foil M.D.   On: 08/02/2018 22:31   Ct Abdomen Pelvis W Contrast  Result Date: 08/03/2018 CLINICAL DATA:  Lower abdominal and vaginal pain, question fistula EXAM: CT ABDOMEN AND PELVIS WITH CONTRAST TECHNIQUE: Multidetector CT imaging of the abdomen and pelvis was performed using the standard protocol following bolus administration of intravenous contrast. CONTRAST:  144mL OMNIPAQUE IOHEXOL 300 MG/ML  SOLN COMPARISON:  CT 09/26/2016 FINDINGS: Lower chest: Lung bases demonstrate partial consolidation within the posterior lung bases. No pleural effusion. The heart size is normal. Hepatobiliary: No focal liver abnormality is seen. No gallstones, gallbladder wall thickening, or biliary dilatation. Pancreas: Unremarkable. No pancreatic ductal dilatation or surrounding inflammatory changes. Spleen: Normal in size without focal abnormality. Adrenals/Urinary Tract: Adrenal glands are normal. No hydronephrosis. Gas within the urinary bladder. Irregular wall thickening of the bladder roof and anterior wall of the bladder with inflammatory mass. This can not be separated from irregular masslike thickening of the distal sigmoid colon, findings have progressed since prior  study and would be consistent with colovesical fistula. Stomach/Bowel: The stomach is nonenlarged. There is no dilated small bowel. The appendix is negative. Irregular masslike thickening of the sigmoid colon. Vascular/Lymphatic: Nonaneurysmal aorta. Extensive aortic atherosclerosis. No significant adenopathy. Reproductive: Large volume of air within the uterus and vagina with stool 1 material, consistent with fistula. Other: No free air. Musculoskeletal: Chronic compression deformity at T11. Mild superior endplate deformity at L5 with increased Schmorl's node at the superior endplates of L4 and L5. Grade 1 anterolisthesis L3 on L4. IMPRESSION: 1. Marked masslike wall thickening of the sigmoid colon  which is contiguous with inflammatory soft tissue mass involving the roof and anterior aspect of the urinary bladder; fluid level within the urinary bladder, consistent with colovesical fistula, worsened since prior study. Now seen is large volume of air and stool material within the uterus and vagina, also consistent with a fistula between the inflammatory sigmoid mass and the uterus and or vagina. Overall the findings have progressed since the prior examination. 2. Partial consolidations within the posterior lower lobes which may reflect atelectasis, pneumonia, or aspiration Electronically Signed   By: Donavan Foil M.D.   On: 08/03/2018 00:41     EKG:   Not done in ED, will get one.   Assessment/Plan Principal Problem:   Lobar pneumonia (HCC) Active Problems:   COPD exacerbation (HCC)   Hypokalemia   Essential hypertension   Sepsis ([place])   Abdominal pain   Normocytic anemia   Alcohol abuse   Protein-calorie malnutrition, severe ([place])   Sepsis due to possible lobar pneumonia [facility]): Patient has leukocytosis, cough, shortness of breath, chest x-ray showed right basilar infiltration, indicating possible lobar pneumonia.  Patient admitted to critical for sepsis with leukocytosis, tachycardia and  tachypnea.  Currently hemodynamically stable.  - will admit to tele bed as inpt - IV Rocephin and azithromycin - Mucinex for cough  - prn Albuterol Nebs for SOB - Urine legionella and S. pneumococcal antigen - Follow up blood culture x2, sputum culture and plus Flu pcr - will get Procalcitonin and trend lactic acid level per sepsis protocol - IVF: 1L of Ringer solution and 500 cc of NS. followed by 75 mL per hour of NS   COPD exacerbation ([place]): Patient's shortness breath may be partially from COPD exacerbation. -See above  Abdominal pain and pelvic pain: CT scan showed worsening tumor burden from previous colon/bladder cancer. There are fistula formation to uterus vagina. Pt chose to not pursue any treatment in the past per report.  I discussed with the patient today, she still chooses to not pursue any treatment. -Supportive care -Pain control: PRN Percocet and morphine - PRN Zofran for nausea -Palliative consult -Cancel urinalysis due to fistula formation  Hypokalemia and hypomagnesemia: K= 2.8 and Mg=1.5  on admission. - Repleted K - Give 2 g of magnesium sulfate  Essential hypertension: Patient is not taking medications at home. -prn hydralazine IV  Normocytic anemia: Hemoglobin 8.2 (10.3 on 04/06/2015). -Check anemia panel  Alcohol abuse: -in remission  Protein calorie malnutrition-severe -Nutrition consult  Inpatient status:  # Patient requires inpatient status due to high intensity of service, high risk for further deterioration and high frequency of surveillance required.  I certify that at the point of admission it is my clinical judgment that the patient will require inpatient hospital care spanning beyond 2 midnights from the point of admission.  . This patient has multiple chronic comorbidities including hypertension, COPD, depression, anxiety, alcohol abuse in remission, possible bladder and colon cancer . Now patient has presenting symptoms include shortness  breath, cough, abdominal pain, pelvic pain. . The worrisome physical exam findings include cachectic looking, abdominal tenderness . The initial radiographic and laboratory data are worrisome because of leukocytosis, hypokalemia, reactive basilar infiltration on chest x-ray concerning for pneumonia.  CT scan showed worsening tumor burden and fistula formation. . Current medical needs: please see my assessment and plan . Predictability of an adverse outcome (risk): Patient has multiple comorbidities, including worsening bilateral/colon cancer, fistula formation, also has possible pneumonia and sepsis, patient is at high risk of deteriorating, will  need to stay in hospital for at least 2 days.       DVT ppx: SQ Heparin Code Status: Full code Family Communication: None at bed side.  Disposition Plan: to be determined Consults called:  none Admission status: Obs / tele    Date of Service 08/03/2018    Ivor Costa [facility] Pager [phone]  If 7PM-7AM, please contact night-coverage [email or web address] Password [email or web address] 08/03/2018, 5:40 AM

## 2018-08-03 NOTE — Progress Notes (Signed)
Pt seen and examined, admitted earlier this am by Dr.Niu, she os 67/F, chronically ill with COPD, alcohol abuse in remission, depression, anxiety, history of Colon/Bladder cancer per family report diagnosed several years ago patient elected not to pursue further work-up and treatment and has not followed with anyone for this in a number of years. -She has noticed both stool and urine draining from her vagina, local excoriation for over a year, but worsening pelvic/lower abdominal pain recently. -In the emergency room she was noted to have hyponatremia, hypokalemia, anemia, mild leukocytosis, CT abdomen pelvis noted marked masslike thickening of the sigmoid colon with fistulous communication with the bladder worsened since prior study and fistula between sigmoid mass and the uterus/vagina both these findings progressed since prior examination.  Comparison CT was 09/2016 at outside hospital -In addition CT also noted bilateral lower lobe consolidations:  Assessment and plan:  Sepsis, possible lower lobe pneumonia -History of COPD -Agree with ceftriaxone, azithromycin -Discontinue IV fluids -Follow-up cultures  Worsening tumor burden, likely from Colon CA, less likely bladder -Patient reports that this was diagnosed 2 to 3 years ago, she did not pursue any further treatment or work-up, patient reports not seeing an oncologist since original diagnosis about 3 years ago -She is a very poor historian, called and left message for her husband -Worsening fistulas, both colovesical and colovaginal, with stool mixed with urine and, ongoing seepage of stool  -Add fentanyl patch -Palliative medicine consulted for goals of care  Hypokalemia, hypomagnesemia -Replace  Chronic microcytic anemia -Due to progressive colon cancer -Monitor, start iron at discharge  Severe protein calorie malnutrition  Tobacco abuse/COPD  Alcohol abuse -In remission  Domenic Polite, MD

## 2018-08-03 NOTE — ED Provider Notes (Signed)
[facility] EMERGENCY DEPARTMENT Provider Note   CSN: [id number] Arrival date & time: 08/02/18  1921     History   Chief Complaint Chief Complaint  Patient presents with  . Vaginal Pain    HPI Beth Meyers is a 69 y.o. female.  HPI  68 year old female brought in by family for evaluation of increasing /pelvicvaginal pain, poor appetite, cough, lethargy and essentially failure to thrive.  They report a past history of bladder and colon cancer.  Per family report, this is diagnosed by Dr. Gaynelle Arabian, urology, several years ago.  I cannot readily confirm this via records.  Family states that she had a cystoscopy.  Patient elected not to pursue treatment and essentially hasn't had follow-up.  Review of records does show multiple phone communications over the past couple years for dysuria/UTI.   Pelvic/vaginal pain has been an ongoing issue but is been worsening recently.  She has also had a productive cough and feels mildly short of breath.  Chills.  No reported fever.  Anorexia.  No vomiting or diarrhea.  Past Medical History:  Diagnosis Date  . Anxiety   . COPD (chronic obstructive pulmonary disease) ([place])   . Depression   . GERD (gastroesophageal reflux disease)   . Hypertension   . Lumbago     Patient Active Problem List   Diagnosis Date Noted  . Acute respiratory failure with hypoxia ([place]) 04/07/2015  . Wernicke encephalopathy 04/07/2015  . Aspiration pneumonia ([place]) 04/03/2015  . Severe sepsis with septic shock ([place])   . Acute encephalopathy 03/28/2015  . Alcohol withdrawal ([place]) 03/28/2015  . COPD exacerbation ([place]) 03/28/2015  . Hypokalemia 03/28/2015  . Essential hypertension 03/28/2015  . Dyslipidemia 03/28/2015    History reviewed. No pertinent surgical history.   OB History   No obstetric history on file.      Home Medications    Prior to Admission medications   Medication Sig Start Date End Date Taking? Authorizing Provider    phenazopyridine (PYRIDIUM) 200 MG tablet Take 200 mg by mouth 3 (three) times daily as needed for pain.   Yes [provider]    Family History Family History  Problem Relation Age of Onset  . Emphysema Brother   . Asthma Brother   . Heart disease Father   . Heart disease Brother     Social History Social History   Tobacco Use  . Smoking status: Former Smoker    Packs/day: 3.00    Years: 90.00    Pack years: 270.00    Types: Cigarettes    Last attempt to quit: 07/24/1992    Years since quitting: 26.0  Substance Use Topics  . Alcohol use: Yes  . Drug use: No     Allergies   Penicillins and Rosuvastatin   Review of Systems Review of Systems  All systems reviewed and negative, other than as noted in HPI. Physical Exam Updated Vital Signs BP 122/70   Pulse (!) 110   Temp 98.6 F (37 C) (Oral)   Resp 14   Ht 5\' 3"  (1.6 m)   Wt 49.9 kg   SpO2 90%   BMI 19.49 kg/m   Physical Exam Vitals signs and nursing note reviewed.  Constitutional:      Appearance: She is well-developed.     Comments: Chronically ill appearing  HENT:     Head: Normocephalic and atraumatic.  Eyes:     General:        Right eye: No discharge.  Left eye: No discharge.     Conjunctiva/sclera: Conjunctivae normal.  Neck:     Musculoskeletal: Neck supple.  Cardiovascular:     Rate and Rhythm: Normal rate and regular rhythm.     Heart sounds: Normal heart sounds. No murmur. No friction rub. No gallop.   Pulmonary:     Effort: No respiratory distress.     Breath sounds: Wheezing present.  Abdominal:     General: There is no distension.     Palpations: Abdomen is soft.     Tenderness: There is no abdominal tenderness.  Musculoskeletal:        General: No tenderness.  Skin:    General: Skin is warm and dry.  Neurological:     Mental Status: She is alert.  Psychiatric:        Behavior: Behavior normal.        Thought Content: Thought content normal.      ED  Treatments / Results  Labs (all labs ordered are listed, but only abnormal results are displayed) Labs Reviewed  COMPREHENSIVE METABOLIC PANEL - Abnormal; Notable for the following components:      Result Value   Sodium 131 (*)    Potassium 2.8 (*)    Chloride 90 (*)    Calcium 8.7 (*)    Albumin 3.1 (*)    AST 10 (*)    Total Bilirubin 2.0 (*)    All other components within normal limits  CBC WITH DIFFERENTIAL/PLATELET - Abnormal; Notable for the following components:   WBC 14.6 (*)    RBC 3.48 (*)    Hemoglobin 8.2 (*)    HCT 29.6 (*)    MCH 23.6 (*)    MCHC 27.7 (*)    RDW 21.3 (*)    Neutro Abs 12.0 (*)    Abs Immature Granulocytes 0.09 (*)    All other components within normal limits  CULTURE, BLOOD (ROUTINE X 2)  CULTURE, BLOOD (ROUTINE X 2)  URINALYSIS, ROUTINE W REFLEX MICROSCOPIC  LACTIC ACID, PLASMA  LACTIC ACID, PLASMA  MAGNESIUM    EKG None  Radiology Dg Chest 2 View  Result Date: 08/02/2018 CLINICAL DATA:  Chronic cough EXAM: CHEST - 2 VIEW COMPARISON:  None. FINDINGS: Mild posterior lung base opacity, possible mild pneumonia. No pleural effusion. Normal heart size. Aortic atherosclerosis. Multiple right upper rib fractures old. Multiple mild compression deformities of the mid to lower thoracic spine. IMPRESSION: Mild patchy posterior lung opacity, possibly the right base, may reflect small pneumonia. Imaging follow-up to resolution is recommended. Electronically Signed   By: Donavan Foil M.D.   On: 08/02/2018 22:31    Procedures Procedures (including critical care time)  Medications Ordered in ED Medications  LORazepam (ATIVAN) injection 0.5 mg (0.5 mg Intravenous Not Given 08/02/18 2258)  azithromycin (ZITHROMAX) 500 mg in sodium chloride 0.9 % 250 mL IVPB (has no administration in time range)  potassium chloride SA (K-DUR,KLOR-CON) CR tablet 20 mEq (has no administration in time range)  potassium chloride 10 mEq in 100 mL IVPB (has no administration  in time range)  lactated ringers bolus 1,000 mL (1,000 mLs Intravenous New Bag/Given 08/02/18 2253)  HYDROmorphone (DILAUDID) injection 0.5 mg (0.5 mg Intravenous Given 08/02/18 2257)  cefTRIAXone (ROCEPHIN) 1 g in sodium chloride 0.9 % 100 mL IVPB (1 g Intravenous New Bag/Given 08/02/18 2325)  iohexol (OMNIPAQUE) 300 MG/ML solution 100 mL (100 mLs Intravenous Contrast Given 08/02/18 2358)     Initial Impression / Assessment and Plan / ED  Course  I have reviewed the triage vital signs and the nursing notes.  Pertinent labs & imaging results that were available during my care of the patient were reviewed by me and considered in my medical decision making (see chart for details).    68 year old female with failure to thrive.  Family reports a past history of bladder/colon cancer although I cannot readily confirm this diagnosis.  We attempted to catheterize her bladder twice.  No urine obtained, but tech reports that it appeared there was stool.  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  This would also explain her recurrent UTIs.  Will CT. Chest x-ray with possible pneumonia.  Given recent respiratory complaints, will treat as such.  Hypokalemia.  Will supplement.  Patient has previously expressed no desire to pursue treatment for cancer.  She still maintains this during my discussions with her.  She would like treatment for pneumonia though and otherwise symptomatic treatment of her symptoms.  She is agreeable to admission.  She is agreeable to palliative care consultation.  Final Clinical Impressions(s) / ED Diagnoses   Final diagnoses:  Failure to thrive in adult  Community acquired pneumonia, unspecified laterality  Hypokalemia    ED Discharge Orders    None       Virgel Manifold, MD 08/03/18 [phone]

## 2018-08-03 NOTE — Progress Notes (Signed)
Initial Nutrition Assessment  DOCUMENTATION CODES:  Underweight, Severe malnutrition in context of chronic illness  INTERVENTION:  Pt reports abd/pelvic pain remains uncontrolled.  Considering the circumstances, would consider liberalizing the patients diet to Regular.   Will monitor outcome of GOC discussion   NUTRITION DIAGNOSIS:  Severe Malnutrition related to untreated cancer x 3 yrs as evidenced by severe muscle/fat wasting  GOAL:  Patient will meet greater than or equal to 90% of their needs   MONITOR:  Labs, I & O's, Skin, Supplement acceptance, PO intake, Goals of Care  REASON FOR ASSESSMENT:  Malnutrition Screening Tool    ASSESSMENT:  68 y/o female PMhx tob/etoh abuse (both in remission), HTN, COPD, Depression/anxiety. Pt dx several years ago w/ bladder/colon cancer. Pt decided to not pursue any tx and never followed up. Presents w/ SOB, cough, abd/pelvic pain and stool/urine draining from vagina. CT showed worsening tumor burden w/ colovaginal & colovesical fistulous communication. Palliative Care consulted r/t GOC  Pt herself cannot offer any information. She says "I do not know" or "no" to all questions. Has no food requests. Declines ice cream, fruit, yogurt, soda etc. Says her appetite is "fine". Doesn't know what her usual weight is. The only information she was able to provide is that her abdominal pain is not controlled and she "needs something else".   There is no one else present to provide any history regarding pts nutrition-related habits.   Per [facility] records via Care Everywhere, pt has lost a drastic amount of wt in past 2 years. She was 180-185 lbs at end of 2017 to early 2018. There are no weights documented in chart since then. She is now 91 lbs.   Labs: Na: 131, K:2.8, Mag: 1.5, Albumin:3.1, WBC:14.6  Meds: Ensure Enlive, Ativan, Prn opioids, KCl, IVF, IV abx  Recent Labs  Lab 08/02/18 2151 08/02/18 2352  NA 131*  --   K 2.8*  --   CL 90*  --    CO2 26  --   BUN 15  --   CREATININE 0.68  --   CALCIUM 8.7*  --   MG  --  1.5*  GLUCOSE 98  --    NUTRITION - FOCUSED PHYSICAL EXAM:   Most Recent Value  Orbital Region  Severe depletion  Upper Arm Region  Severe depletion  Thoracic and Lumbar Region  Severe depletion  Buccal Region  Moderate depletion  Temple Region  Moderate depletion  Clavicle Bone Region  Severe depletion  Clavicle and Acromion Bone Region  Severe depletion  Scapular Bone Region  Unable to assess  Dorsal Hand  Severe depletion  Patellar Region  Severe depletion  Anterior Thigh Region  Severe depletion  Posterior Calf Region  Severe depletion  Edema (RD Assessment)  None     Diet Order:   Diet Order            Diet Heart Room service appropriate? Yes; Fluid consistency: Thin  Diet effective now             EDUCATION NEEDS:  No education needs have been identified at this time  Skin: Excoriated Vagina/perineum  Last BM:  1/7  Height:  Ht Readings from Last 1 Encounters:  08/02/18 5\' 3"  (1.6 m)   Weight:  Wt Readings from Last 1 Encounters:  08/03/18 41.4 kg   Wt Readings from Last 10 Encounters:  08/03/18 41.4 kg  04/06/15 91.7 kg  12/13/10 78.5 kg  08/26/10 73 kg   Ideal Body Weight:  52.27 kg  BMI:  Body mass index is 16.17 kg/m.  Estimated Nutritional Needs:  Kcal:  1650-1850 kcals (40-45 kcal/kg bw) Protein:  >83g Pro (2g/kg bw) Fluid:  >1.5 L fluid (35 ml/kg bw)  Burtis Junes RD, LDN, CNSC Clinical Nutrition Available Tues-Sat via Pager: [phone] 08/03/2018 2:40 PM

## 2018-08-03 NOTE — ED Notes (Addendum)
Patient cleaned of mixture of urine and feces coming from vagina.  Patient is very excoriated on the outside and inside of her labial folds.   Skin protectant placed on outside of folds.

## 2018-08-04 DIAGNOSIS — J181 Lobar pneumonia, unspecified organism: Secondary | ICD-10-CM

## 2018-08-04 DIAGNOSIS — A419 Sepsis, unspecified organism: Principal | ICD-10-CM

## 2018-08-04 DIAGNOSIS — R627 Adult failure to thrive: Secondary | ICD-10-CM

## 2018-08-04 DIAGNOSIS — R1030 Lower abdominal pain, unspecified: Secondary | ICD-10-CM

## 2018-08-04 DIAGNOSIS — Z7189 Other specified counseling: Secondary | ICD-10-CM

## 2018-08-04 DIAGNOSIS — E43 Unspecified severe protein-calorie malnutrition: Secondary | ICD-10-CM

## 2018-08-04 DIAGNOSIS — Z515 Encounter for palliative care: Secondary | ICD-10-CM

## 2018-08-04 LAB — BASIC METABOLIC PANEL
Anion gap: 8 (ref 5–15)
BUN: 11 mg/dL (ref 8–23)
CO2: 30 mmol/L (ref 22–32)
Calcium: 8.6 mg/dL — ABNORMAL LOW (ref 8.9–10.3)
Chloride: 97 mmol/L — ABNORMAL LOW (ref 98–111)
Creatinine, Ser: 0.54 mg/dL (ref 0.44–1.00)
GFR calc Af Amer: >60 mL/min (ref >60)
GFR calc non Af Amer: >60 mL/min (ref >60)
Glucose, Bld: 127 mg/dL — ABNORMAL HIGH (ref 70–99)
Potassium: 3.6 mmol/L (ref 3.5–5.1)
Sodium: 135 mmol/L (ref 135–145)

## 2018-08-04 LAB — ABO/RH: ABO/RH(D): O POS

## 2018-08-04 LAB — CBC
HEMATOCRIT: 22.9 % — AB (ref 36.0–46.0)
Hemoglobin: 6.6 g/dL — CL (ref 12.0–15.0)
MCH: 24.4 pg — ABNORMAL LOW (ref 26.0–34.0)
MCHC: 28.8 g/dL — ABNORMAL LOW (ref 30.0–36.0)
MCV: 84.8 fL (ref 80.0–100.0)
Platelets: 243 10*3/uL (ref 150–400)
RBC: 2.7 MIL/uL — ABNORMAL LOW (ref 3.87–5.11)
RDW: 20.6 % — ABNORMAL HIGH (ref 11.5–15.5)
WBC: 7.5 10*3/uL (ref 4.0–10.5)
nRBC: 0 % (ref 0.0–0.2)

## 2018-08-04 LAB — HEMOGLOBIN AND HEMATOCRIT, BLOOD
HCT: 37.3 % (ref 36.0–46.0)
HEMOGLOBIN: 11.6 g/dL — AB (ref 12.0–15.0)

## 2018-08-04 LAB — PREPARE RBC (CROSSMATCH)

## 2018-08-04 MED ORDER — METOPROLOL TARTRATE 5 MG/5ML IV SOLN
5.0000 mg | Freq: Once | INTRAVENOUS | Status: DC
  Filled 2018-08-04: qty 5

## 2018-08-04 MED ORDER — FUROSEMIDE 10 MG/ML IJ SOLN
20.0000 mg | Freq: Once | INTRAMUSCULAR | Status: AC
  Administered 2018-08-04: 20 mg via INTRAVENOUS
  Filled 2018-08-04: qty 2

## 2018-08-04 MED ORDER — SODIUM CHLORIDE 0.9% IV SOLUTION: Freq: Once | INTRAVENOUS | Status: DC

## 2018-08-04 NOTE — Progress Notes (Signed)
Discussed poor prognosis with Pt's spouse and daughter -agree to DNR -understand that she needs Hospice, will d/w Palliative Team this afternoon, not sure they can handle her needs at home  Domenic Polite, MD

## 2018-08-04 NOTE — Progress Notes (Signed)
CRITICAL Hgb 6.6, MD Baltazar Najjar paged. New orders to transfuse 2 units PRBCs received, will continue to monitor.

## 2018-08-04 NOTE — Progress Notes (Addendum)
PROGRESS NOTE    Beth Meyers  TMH:[id number] DOB: 09-Nov-1950 DOA: 08/02/2018 PCP: Christain Sacramento, MD  Brief Narrative: 67/F, chronically ill with COPD, alcohol abuse in remission, severe debility, depression, anxiety, history of Colon/Bladder cancer per family report diagnosed 2 years ago patient elected not to pursue further work-up and treatment and has not followed with anyone for this. -She has noticed both stool and urine draining from her vagina, stool and urine for 2 years, worsening drainage recently and pain  hence presented to the emergency room. -In the ED she was noted to have hyponatremia, hypokalemia, anemia, mild leukocytosis, CT abdomen pelvis noted marked masslike thickening of the sigmoid colon with fistulous communication with the bladder worsened since prior study and fistula between sigmoid mass and the uterus/vagina both these findings progressed since prior examination.  Comparison CT was 09/2016 at outside hospital -In addition CT also noted bilateral lower lobe consolidations:   Assessment & Plan:   Sepsis, bilateral lower lobe pneumonia -In the background of advanced COPD -Continue IV ceftriaxone, azithromycin -Blood cultures are negative -Transition to oral antibiotics in 1 to 2 days  Worsening tumor burden, likely from Colon CA, less likely bladder -Patient reports that this was diagnosed 2 to 3 years ago, she did not pursue any further treatment or work-up, patient reports not seeing an oncologist since original diagnosis about 2 years ago -She was not felt to be a good surgical candidate in 2018 by urology Dr. Gaynelle Arabian per patient's report -Worsening fistulas, both colovesical and colovaginal, with stool mixed with urine and, ongoing seepage of stool  -Worsening debility, malnutrition, failure to thrive with progressing cancer -Discussed overall poor prognosis with daughter yesterday, told her that she would not be a good candidate for chemotherapy  radiation etc. -Palliative medicine consulted for goals of care  Cognitive deficits -Dementia suspected, could be alcohol induced from long history of alcoholism  Hypokalemia, hypomagnesemia -Replace  Acute on chronic iron deficiency anemia -Due to progression of colon cancer, worsened by hemodilution, of IV fluids discontinued -Hemoglobin down to 6.6 today will transfuse 2 units of PRBC and give IV iron tomorrow  Severe protein calorie malnutrition -Supplements as tolerated  Tobacco abuse/COPD  Alcohol abuse -In remission  DVT prophylaxis: Heparin subcutaneous Code Status: Full code, on discussion with patient yesterday she was not able to answer these questions appropriately kept saying I do not know, will be discussed further with family at palliative care meeting Family Communication: No family at bedside, called and discussed with daughter yesterday Disposition Plan: To be determined  Consultants:  Palliative medicine   Procedures:   Antimicrobials:    Subjective: -Feels a little better on the pain medicine, reports being weak and tired -Breathing improving, no chest pain, chronic cough  Objective: Vitals:   08/04/18 0040 08/04/18 0816 08/04/18 1004 08/04/18 1032  BP: 111/71 108/75 98/65 97/62   Pulse: 96 (!) 109 (!) 111 100  Resp: 16  16 14   Temp: 97.8 F (36.6 C) 98.1 F (36.7 C) 97.7 F (36.5 C) 97.8 F (36.6 C)  TempSrc: Oral Oral Oral Oral  SpO2: 96% 94% 96% 98%  Weight:      Height:        Intake/Output Summary (Last 24 hours) at 08/04/2018 1051 Last data filed at 08/04/2018 0944 Gross per 24 hour  Intake 0 ml  Output -  Net 0 ml   Filed Weights   08/02/18 1930 08/03/18 0345  Weight: 49.9 kg 41.4 kg    Examination:  Gen: Severely cachectic chronically ill-appearing female, alert awake oriented to self and partly to place only, very poor historian, cognitive deficits noted HEENT: PERRLA, Neck supple, no JVD Lungs: Improved air  movement, few rhonchi at the bases CVS: RRR,No Gallops,Rubs or new Murmurs Abd: Soft, mildly distended, nontender, bowel sounds present,, stool continuously seeping per vagina Extremities: Excoriations in upper thigh area, no edema Skin: As above Psychiatry: Poor judgement and insight     Data Reviewed:   CBC: Recent Labs  Lab 08/02/18 2151 08/04/18 0244  WBC 14.6* 7.5  NEUTROABS 12.0*  --   HGB 8.2* 6.6*  HCT 29.6* 22.9*  MCV 85.1 84.8  PLT 364 725   Basic Metabolic Panel: Recent Labs  Lab 08/02/18 2151 08/02/18 2352 08/04/18 0810  NA 131*  --  135  K 2.8*  --  3.6  CL 90*  --  97*  CO2 26  --  30  GLUCOSE 98  --  127*  BUN 15  --  11  CREATININE 0.68  --  0.54  CALCIUM 8.7*  --  8.6*  MG  --  1.5*  --    GFR: Estimated Creatinine Clearance: 43.4 mL/min (by C-G formula based on SCr of 0.54 mg/dL). Liver Function Tests: Recent Labs  Lab 08/02/18 2151  AST 10*  ALT 6  ALKPHOS 50  BILITOT 2.0*  PROT 6.6  ALBUMIN 3.1*   No results for input(s): LIPASE, AMYLASE in the last 168 hours. No results for input(s): AMMONIA in the last 168 hours. Coagulation Profile: Recent Labs  Lab 08/03/18 0123  INR 1.18   Cardiac Enzymes: No results for input(s): CKTOTAL, CKMB, CKMBINDEX, TROPONINI in the last 168 hours. BNP (last 3 results) No results for input(s): PROBNP in the last 8760 hours. HbA1C: No results for input(s): HGBA1C in the last 72 hours. CBG: No results for input(s): GLUCAP in the last 168 hours. Lipid Profile: No results for input(s): CHOL, HDL, LDLCALC, TRIG, CHOLHDL, LDLDIRECT in the last 72 hours. Thyroid Function Tests: No results for input(s): TSH, T4TOTAL, FREET4, T3FREE, THYROIDAB in the last 72 hours. Anemia Panel: Recent Labs    08/03/18 0123 08/03/18 0124  VITAMINB12 442  --   FOLATE  --  6.4  FERRITIN 55  --   TIBC 195*  --   IRON 18*  --   RETICCTPCT 4.7*  --    Urine analysis:    Component Value Date/Time   COLORURINE RED  (A) 04/03/2015 1453   APPEARANCEUR TURBID (A) 04/03/2015 1453   LABSPEC 1.034 (H) 04/03/2015 1453   PHURINE 5.0 04/03/2015 1453   GLUCOSEU NEGATIVE 04/03/2015 1453   HGBUR LARGE (A) 04/03/2015 1453   BILIRUBINUR LARGE (A) 04/03/2015 1453   KETONESUR NEGATIVE 04/03/2015 1453   PROTEINUR >300 (A) 04/03/2015 1453   UROBILINOGEN 1.0 04/03/2015 1453   NITRITE POSITIVE (A) 04/03/2015 1453   LEUKOCYTESUR MODERATE (A) 04/03/2015 1453   Sepsis Labs: @LABRCNTIP (procalcitonin:4,lacticidven:4)  ) Recent Results (from the past 240 hour(s))  Blood culture (routine x 2)     Status: None (Preliminary result)   Collection Time: 08/02/18 11:30 PM  Result Value Ref Range Status   Specimen Description BLOOD RIGHT ARM  Final   Special Requests   Final    BOTTLES DRAWN AEROBIC AND ANAEROBIC Blood Culture adequate volume   Culture   Final    NO GROWTH 1 DAY Performed at [facility], [place] [address] [place], [place] [address]    Report Status PENDING  Incomplete  Blood culture (routine x 2)     Status: None (Preliminary result)   Collection Time: 08/02/18 11:57 PM  Result Value Ref Range Status   Specimen Description BLOOD LEFT HAND  Final   Special Requests   Final    BOTTLES DRAWN AEROBIC ONLY Blood Culture adequate volume   Culture   Final    NO GROWTH 1 DAY Performed at [facility], [place] [address] [place], [place] [address]    Report Status PENDING  Incomplete         Radiology Studies: Dg Chest 2 View  Result Date: 08/02/2018 CLINICAL DATA:  Chronic cough EXAM: CHEST - 2 VIEW COMPARISON:  None. FINDINGS: Mild posterior lung base opacity, possible mild pneumonia. No pleural effusion. Normal heart size. Aortic atherosclerosis. Multiple right upper rib fractures old. Multiple mild compression deformities of the mid to lower thoracic spine. IMPRESSION: Mild patchy posterior lung opacity, possibly the right base, may reflect small pneumonia. Imaging follow-up to resolution is  recommended. Electronically Signed   By: Donavan Foil M.D.   On: 08/02/2018 22:31   Ct Abdomen Pelvis W Contrast  Result Date: 08/03/2018 CLINICAL DATA:  Lower abdominal and vaginal pain, question fistula EXAM: CT ABDOMEN AND PELVIS WITH CONTRAST TECHNIQUE: Multidetector CT imaging of the abdomen and pelvis was performed using the standard protocol following bolus administration of intravenous contrast. CONTRAST:  134mL OMNIPAQUE IOHEXOL 300 MG/ML  SOLN COMPARISON:  CT 09/26/2016 FINDINGS: Lower chest: Lung bases demonstrate partial consolidation within the posterior lung bases. No pleural effusion. The heart size is normal. Hepatobiliary: No focal liver abnormality is seen. No gallstones, gallbladder wall thickening, or biliary dilatation. Pancreas: Unremarkable. No pancreatic ductal dilatation or surrounding inflammatory changes. Spleen: Normal in size without focal abnormality. Adrenals/Urinary Tract: Adrenal glands are normal. No hydronephrosis. Gas within the urinary bladder. Irregular wall thickening of the bladder roof and anterior wall of the bladder with inflammatory mass. This can not be separated from irregular masslike thickening of the distal sigmoid colon, findings have progressed since prior study and would be consistent with colovesical fistula. Stomach/Bowel: The stomach is nonenlarged. There is no dilated small bowel. The appendix is negative. Irregular masslike thickening of the sigmoid colon. Vascular/Lymphatic: Nonaneurysmal aorta. Extensive aortic atherosclerosis. No significant adenopathy. Reproductive: Large volume of air within the uterus and vagina with stool 1 material, consistent with fistula. Other: No free air. Musculoskeletal: Chronic compression deformity at T11. Mild superior endplate deformity at L5 with increased Schmorl's node at the superior endplates of L4 and L5. Grade 1 anterolisthesis L3 on L4. IMPRESSION: 1. Marked masslike wall thickening of the sigmoid colon which is  contiguous with inflammatory soft tissue mass involving the roof and anterior aspect of the urinary bladder; fluid level within the urinary bladder, consistent with colovesical fistula, worsened since prior study. Now seen is large volume of air and stool material within the uterus and vagina, also consistent with a fistula between the inflammatory sigmoid mass and the uterus and or vagina. Overall the findings have progressed since the prior examination. 2. Partial consolidations within the posterior lower lobes which may reflect atelectasis, pneumonia, or aspiration Electronically Signed   By: Donavan Foil M.D.   On: 08/03/2018 00:41        Scheduled Meds: . sodium chloride   Intravenous Once  . feeding supplement (ENSURE ENLIVE)  237 mL Oral BID BM  . fentaNYL  25 mcg Transdermal Q72H  . heparin  5,000 Units Subcutaneous Q8H  .  LORazepam  0.5 mg Intravenous Once  . metoprolol tartrate  5 mg Intravenous Once   Continuous Infusions: . azithromycin 500 mg (08/03/18 2359)  . cefTRIAXone (ROCEPHIN)  IV 1 g (08/03/18 2138)     LOS: 1 day    Time spent: 68min    Domenic Polite, MD [facility] Page via [email or web address], password [email or web address] After 7PM please contact night-coverage  08/04/2018, 10:51 AM

## 2018-08-04 NOTE — Consult Note (Addendum)
Consultation Note Date: 08/04/2018   Patient Name: Beth Meyers  DOB: 1950-08-26  MRN: [id number]  Age / Sex: 68 y.o., female  PCP: Christain Sacramento, MD Referring Physician: Domenic Polite, MD  Reason for Consultation: Establishing goals of care, Hospice Evaluation, Inpatient hospice referral, Non pain symptom management, Pain control and Psychosocial/spiritual support  HPI/Patient Profile: 68 y.o. female  with past medical history of severe COPD for which patient has been on disability, hypertension, history of alcohol use disorder, bladder/colon cancer (diagnosed approximately 2-1/2 years ago; no treatment, was not a surgical candidate) admitted on 08/02/2018 with increased shortness of breath, abdominal and pelvic pain.  Patient was found to have pneumonia was hypotensive and was admitted for sepsis.  CT of the abdomen showed marketed wall thickening of the sigmoid colon with contiguous inflammatory mass next to the bladder and now new colo-vesicular fistula.  Patient is draining stool continuously from her vagina.  She was also found to have a hemoglobin of only 6.6.  Consult ordered for goals of care .   Clinical Assessment and Goals of Care: Patient seen, chart reviewed.  Met with patient's husband, Beth Meyers and daughter Beth Meyers.  They describe a very unfortunate trajectory over the past 2 years since her diagnosis with bladder/colon cancer.  Patient has also quit drinking for approximately 3 years.  Family states since her diagnosis and stopping drinking that she has been extremely withdrawn.  She has not seen a doctor since that time and does not leave the house.  At the time of her diagnosis 2-1/2 years ago her lung disease was so severe she was deemed not a surgical candidate because of fear she could become ventilator dependent  Family reports 5 months ago she weighed 180 pounds.  She now has a BMI of 16.17 and  weighs 90 pounds .  Family reports she is no longer eating and is bedbound.  Does not have an official diagnosis of dementia but family does paint a picture congruent with underlying dementia as well.  No longer walking, is not conversant even with family.  She will answer questions yes or no when asked  Family reports the only way that she came to the hospital is that her pain abd and pelvis has become so severe as well as the drainage from her fistula is nonstop and has her skin completely excoriated  Patient can no longer speak for herself.  Her healthcare proxy is her husband Beth Meyers.  Beth Meyers is very emotional and overwhelmed and wishes all conversations to go through their daughter,Beth Meyers [phone]    SUMMARY OF RECOMMENDATIONS   DNR/DNI Maximize patient's condition is much as possible with continued use of antibiotics as well as transfusion Family is hopeful for residential hosp[facility] bed in [facility].  They understand that no further IV antibiotics, transfusions, IV fluids will be given and hospice facility.  They recognize focus will be on symptom management, comfort, dignity Consult placed to social work.  Palliative medicine to stay involved and help with disposition as well  as ongoing pain management Code Status/Advance Care Planning:  DNR    Symptom Management:   Pain: Patient was started on a fentanyl transdermal patch 25 mcg every 72 hours.  Continue for now but she is quite cachectic and other options may need to be addressed such as injectable Dilaudid.  Continue with Dilaudid 1 mg every 3 hours as needed as well as Percocet 1 tablet every 4 hours as needed.  Patient long-range would benefit from a continuous infusion  Palliative Prophylaxis:   Aspiration, Bowel Regimen, Delirium Protocol, Eye Care, Frequent Pain Assessment, Oral Care, Palliative Wound Care and Turn Reposition  Additional Recommendations (Limitations, Scope, Preferences):  Avoid Hospitalization,  Minimize Medications, Initiate Comfort Feeding, No Artificial Feeding, No Chemotherapy, No Hemodialysis, No Radiation and No Surgical Procedures  Psycho-social/Spiritual:   Desire for further Chaplaincy support:no  Additional Recommendations: Referral to Community Resources   Prognosis:   < 2 weeks in the setting of advanced bladder/colon cancer now with fistula, draining large amount of stool through vagina, severe pain and is now bleeding.  Patient at high risk for an acute event such as exsanguination and recurrent sepsis  Discharge Planning: Hospice facility      Primary Diagnoses: Present on Admission: . Essential hypertension . COPD exacerbation ([place]) . Lobar pneumonia ([place]) . Sepsis ([place]) . Abdominal pain . Hypokalemia . Normocytic anemia . Alcohol abuse . Protein-calorie malnutrition, severe ([place])   I have reviewed the medical record, interviewed the patient and family, and examined the patient. The following aspects are pertinent.  Past Medical History:  Diagnosis Date  . Anxiety   . COPD (chronic obstructive pulmonary disease) ([place])   . Depression   . GERD (gastroesophageal reflux disease)   . Hypertension   . Lumbago    Social History   Socioeconomic History  . Marital status: Married    Spouse name: Not on file  . Number of children: Not on file  . Years of education: Not on file  . Highest education level: Not on file  Occupational History  . Occupation: Glass blower/designer  Social Needs  . Financial resource strain: Not on file  . Food insecurity:    Worry: Not on file    Inability: Not on file  . Transportation needs:    Medical: Not on file    Non-medical: Not on file  Tobacco Use  . Smoking status: Former Smoker    Packs/day: 3.00    Years: 90.00    Pack years: 270.00    Types: Cigarettes    Last attempt to quit: 07/24/1992    Years since quitting: 26.0  . Smokeless tobacco: Never Used  Substance and Sexual Activity  . Alcohol use: Yes    . Drug use: No  . Sexual activity: Not Currently  Lifestyle  . Physical activity:    Days per week: 0 days    Minutes per session: 0 min  . Stress: Not on file  Relationships  . Social connections:    Talks on phone: Not on file    Gets together: Not on file    Attends religious service: Not on file    Active member of club or organization: Not on file    Attends meetings of clubs or organizations: Not on file    Relationship status: Not on file  Other Topics Concern  . Not on file  Social History Narrative  . Not on file   Family History  Problem Relation Age of Onset  . Emphysema Brother   .  Asthma Brother   . Heart disease Father   . Heart disease Brother    Scheduled Meds: . sodium chloride   Intravenous Once  . feeding supplement (ENSURE ENLIVE)  237 mL Oral BID BM  . fentaNYL  25 mcg Transdermal Q72H  . furosemide  20 mg Intravenous Once  . heparin  5,000 Units Subcutaneous Q8H  . LORazepam  0.5 mg Intravenous Once  . metoprolol tartrate  5 mg Intravenous Once   Continuous Infusions: . azithromycin 500 mg (08/03/18 2359)  . cefTRIAXone (ROCEPHIN)  IV 1 g (08/03/18 2138)   PRN Meds:.acetaminophen, albuterol, dextromethorphan-guaiFENesin, hydrALAZINE, HYDROmorphone (DILAUDID) injection, ondansetron (ZOFRAN) IV, oxyCODONE-acetaminophen, phenazopyridine, zolpidem Medications Prior to Admission:  Prior to Admission medications   Medication Sig Start Date End Date Taking? Authorizing Provider  phenazopyridine (PYRIDIUM) 200 MG tablet Take 200 mg by mouth 3 (three) times daily as needed for pain.   Yes [provider]   Allergies  Allergen Reactions  . Penicillins Hives    .DID THE REACTION INVOLVE: Swelling of the face/tongue/throat, SOB, or low BP? Unknown Sudden or severe rash/hives, skin peeling, or the inside of the mouth or nose? Unknown Did it require medical treatment? Unknown When did it last happen?more than 10 years If all above answers  are "NO", may proceed with cephalosporin use.   . Rosuvastatin Rash    Unknown   Review of Systems  Unable to perform ROS: Acuity of condition    Physical Exam Vitals signs and nursing note reviewed.  Constitutional:      Comments: Acutely ill-appearing older female, cachectic  HENT:     Head: Normocephalic and atraumatic.  Cardiovascular:     Rate and Rhythm: Normal rate.  Pulmonary:     Comments: Weak respiratory effort Genitourinary:    Comments: Excoriated Skin:    Coloration: Skin is pale.     Comments: Perineum excoriated  Neurological:     Mental Status: She is alert.     Comments: Oriented to self  Psychiatric:     Comments: Affect flat Minimally conversant.  Will only answer yes and no.  No overt agitation otherwise unable to test mental status     Vital Signs: BP 107/70 (BP Location: Left Arm)   Pulse 97   Temp 98 F (36.7 C) (Oral)   Resp 16   Ht '5\' 3"'  (1.6 m)   Wt 41.4 kg   LMP  (LMP Unknown)   SpO2 94%   BMI 16.17 kg/m  Pain Scale: 0-10   Pain Score: 9    SpO2: SpO2: 94 % O2 Device:SpO2: 94 % O2 Flow Rate: .O2 Flow Rate (L/min): 3 L/min  IO: Intake/output summary:   Intake/Output Summary (Last 24 hours) at 08/04/2018 1456 Last data filed at 08/04/2018 0944 Gross per 24 hour  Intake 0 ml  Output -  Net 0 ml    LBM: Last BM Date: 08/04/18 Baseline Weight: Weight: 49.9 kg Most recent weight: Weight: 41.4 kg     Palliative Assessment/Data:   Flowsheet Rows     Most Recent Value  Intake Tab  Referral Department  Hospitalist  Palliative Care Primary Diagnosis  Cancer  Date Notified  08/03/18  Palliative Care Type  New Palliative care  Reason for referral  Pain, Clarify Goals of Care  Date of Admission  08/03/18  Date first seen by Palliative Care  08/04/18  # of days Palliative referral response time  1 Day(s)  # of days IP prior to Palliative referral  0  Clinical Assessment  Palliative Performance Scale Score  30%  Pain Max  last 24 hours  Not able to report  Pain Min Last 24 hours  Not able to report  Dyspnea Max Last 24 Hours  Not able to report  Dyspnea Min Last 24 hours  Not able to report  Nausea Max Last 24 Hours  Not able to report  Nausea Min Last 24 Hours  Not able to report  Anxiety Max Last 24 Hours  Not able to report  Anxiety Min Last 24 Hours  Not able to report  Other Max Last 24 Hours  Not able to report  Psychosocial & Spiritual Assessment  Palliative Care Outcomes  Patient/Family meeting held?  Yes  Who was at the meeting?  husband and dtr  Palliative Care Outcomes  Improved pain interventions, Provided psychosocial or spiritual support, Provided end of life care assistance, Transitioned to hospice, Completed durable DNR  Patient/Family wishes: Interventions discontinued/not started   Mechanical Ventilation, Vasopressors, NIPPV, Trach, BiPAP, Hemodialysis, PEG, Tube feedings/TPN  Palliative Care follow-up planned  Yes, Facility      Time In: 1400 Time Out: 1515 Time Total: 75 min Greater than 50%  of this time was spent counseling and coordinating care related to the above assessment and plan. Staffed with Dr. Broadus John  Signed by: Dory Horn, NP   Please contact Palliative Medicine Team phone at [phone] for questions and concerns.  For individual provider: See Shea Evans

## 2018-08-04 NOTE — Plan of Care (Signed)
No new areas of skin breakdown.  Pt with little tolerance for skin care due to pain.  Applied moisture barrier cream for added protection due to consistent moisture.

## 2018-08-05 LAB — BPAM RBC
Blood Product Expiration Date: 202002042359
Blood Product Expiration Date: 202002042359
ISSUE DATE / TIME: 202001121422
ISSUE DATE / TIME: 202001120957
Unit Type and Rh: 5100
Unit Type and Rh: 5100

## 2018-08-05 LAB — TYPE AND SCREEN
ABO/RH(D): O POS
Antibody Screen: NEGATIVE
Unit division: 0
Unit division: 0

## 2018-08-05 LAB — CBC
HCT: 34.2 % — ABNORMAL LOW (ref 36.0–46.0)
Hemoglobin: 10.5 g/dL — ABNORMAL LOW (ref 12.0–15.0)
MCH: 25.6 pg — ABNORMAL LOW (ref 26.0–34.0)
MCHC: 30.7 g/dL (ref 30.0–36.0)
MCV: 83.4 fL (ref 80.0–100.0)
Platelets: 203 10*3/uL (ref 150–400)
RBC: 4.1 MIL/uL (ref 3.87–5.11)
RDW: 18 % — ABNORMAL HIGH (ref 11.5–15.5)
WBC: 7.9 10*3/uL (ref 4.0–10.5)
nRBC: 0 % (ref 0.0–0.2)

## 2018-08-05 LAB — BASIC METABOLIC PANEL
Anion gap: 8 (ref 5–15)
BUN: 10 mg/dL (ref 8–23)
CHLORIDE: 95 mmol/L — AB (ref 98–111)
CO2: 32 mmol/L (ref 22–32)
Calcium: 8.6 mg/dL — ABNORMAL LOW (ref 8.9–10.3)
Creatinine, Ser: 0.42 mg/dL — ABNORMAL LOW (ref 0.44–1.00)
GFR calc Af Amer: >60 mL/min (ref >60)
GFR calc non Af Amer: >60 mL/min (ref >60)
Glucose, Bld: 99 mg/dL (ref 70–99)
Potassium: 3.8 mmol/L (ref 3.5–5.1)
Sodium: 135 mmol/L (ref 135–145)

## 2018-08-05 MED ORDER — SODIUM CHLORIDE 0.9 % IV SOLN
2.0000 mg/h | INTRAVENOUS | Status: DC
  Administered 2018-08-05: 0.5 mg/h via INTRAVENOUS
  Administered 2018-08-06 – 2018-08-07 (×3): 2 mg/h via INTRAVENOUS
  Filled 2018-08-05 (×4): qty 2.5

## 2018-08-05 MED ORDER — HYDROMORPHONE HCL 1 MG/ML IJ SOLN
1.0000 mg | INTRAMUSCULAR | Status: DC | PRN
  Administered 2018-08-05 – 2018-08-06 (×3): 1 mg via INTRAVENOUS
  Filled 2018-08-05 (×3): qty 1

## 2018-08-05 MED ORDER — LORAZEPAM 2 MG/ML IJ SOLN
1.0000 mg | INTRAMUSCULAR | Status: DC | PRN
  Administered 2018-08-06: 1 mg via INTRAVENOUS
  Filled 2018-08-05: qty 1

## 2018-08-05 MED ORDER — AZITHROMYCIN 250 MG PO TABS: 500.0000 mg | ORAL_TABLET | Freq: Every day | ORAL | Status: DC

## 2018-08-05 MED ORDER — CEFDINIR 300 MG PO CAPS
300.0000 mg | ORAL_CAPSULE | Freq: Two times a day (BID) | ORAL | Status: DC
  Administered 2018-08-05 – 2018-08-06 (×3): 300 mg via ORAL
  Filled 2018-08-05 (×4): qty 1

## 2018-08-05 MED ORDER — MIDAZOLAM HCL 2 MG/2ML IJ SOLN
2.0000 mg | INTRAMUSCULAR | Status: DC | PRN
  Administered 2018-08-07 (×2): 2 mg via INTRAVENOUS
  Filled 2018-08-05 (×2): qty 2

## 2018-08-05 MED ORDER — HYDROMORPHONE HCL 1 MG/ML IJ SOLN
1.0000 mg | INTRAMUSCULAR | Status: DC
  Administered 2018-08-05: 1 mg via INTRAVENOUS
  Filled 2018-08-05: qty 1

## 2018-08-05 MED ORDER — HYDROMORPHONE BOLUS VIA INFUSION
1.0000 mg | INTRAVENOUS | Status: DC | PRN
  Filled 2018-08-05: qty 1

## 2018-08-05 MED ORDER — DIPHENHYDRAMINE HCL 50 MG/ML IJ SOLN
12.5000 mg | Freq: Four times a day (QID) | INTRAMUSCULAR | Status: DC | PRN
  Administered 2018-08-05: 12.5 mg via INTRAVENOUS
  Filled 2018-08-05: qty 1

## 2018-08-05 MED ORDER — LUBRIDERM SERIOUSLY SENSITIVE EX LOTN
TOPICAL_LOTION | CUTANEOUS | Status: DC | PRN
  Filled 2018-08-05: qty 473

## 2018-08-05 MED ORDER — HYDROMORPHONE BOLUS VIA INFUSION
2.0000 mg | INTRAVENOUS | Status: DC | PRN
  Administered 2018-08-05 – 2018-08-07 (×2): 2 mg via INTRAVENOUS
  Filled 2018-08-05: qty 2

## 2018-08-05 NOTE — Progress Notes (Signed)
Palliative Medicine RN Note: Long discussions with both Ebony Hail, Therapist, sports, and Dr Hilma Favors, PMT medical director. We discussed the use of Versed for comfort during pericare. Dr Hilma Favors gave the order, and Ebony Hail will speak with her staff about the medication/indication.  Please call our team at [phone] with any questions until 7pm. After that, please call the attending.  Marjie Skiff Terica Yogi, RN, BSN, [facility] Palliative Medicine Team 08/05/2018 4:12 PM Office [phone]

## 2018-08-05 NOTE — Progress Notes (Signed)
PROGRESS NOTE    Beth Meyers  QXI:[id number] DOB: 18-Sep-1950 DOA: 08/02/2018 PCP: Christain Sacramento, MD  Brief Narrative: 67/F, chronically ill with COPD, alcohol abuse in remission, severe debility, depression, anxiety, history of Colon/Bladder cancer per family report diagnosed 2 years ago patient elected not to pursue further work-up and treatment and has not followed with anyone for this. -She has noticed both stool and urine draining from her vagina, stool and urine for 2 years, worsening drainage recently and pain  hence presented to the emergency room. -In the ED she was noted to have hyponatremia, hypokalemia, anemia, mild leukocytosis, CT abdomen pelvis noted marked masslike thickening of the sigmoid colon with fistulous communication with the bladder worsened since prior study and fistula between sigmoid mass and the uterus/vagina both these findings progressed since prior examination.  Comparison CT was 09/2016 at outside hospital -In addition CT also noted bilateral lower lobe consolidations:   Assessment & Plan:   Sepsis, bilateral lower lobe pneumonia -In the background of advanced COPD -improving on IV ceftriaxone, azithromycin -Blood cultures are negative -Transition to oral Cefdinir today  Worsening tumor burden, likely from Colon CA, less likely bladder -Patient reports that this was diagnosed 2 to 3 years ago, she did not pursue any further treatment or work-up, patient reports not seeing an oncologist since original diagnosis about 2 years ago -She was not felt to be a good surgical candidate in 2018 by urology Dr. Gaynelle Arabian per patient's report -Worsening fistulas, both colovesical and colovaginal, with stool mixed with urine and, ongoing seepage of stool  -Worsening debility, malnutrition, failure to thrive with progressing cancer -Discussed overall very poor prognosis with and pts daughter and spouse on 2 occasions -s/p palliative care meeting yesterday, plan for  consideration of residential hospice, goals are mostly comfort oriented care at this time  Cognitive deficits -Dementia suspected, could be alcohol induced from long history of alcoholism  Hypokalemia, hypomagnesemia -Replaced  Acute on chronic iron deficiency anemia -Due to progression of colon cancer, worsened by hemodilution, of IV fluids discontinued -Globin trended down to 6.6 yesterday was given 2 units of PRBC, it is 10.4 now -Comfort focused care  Severe protein calorie malnutrition -Supplements as tolerated  Tobacco abuse/COPD  Alcohol abuse -In remission  DVT prophylaxis: Heparin subcutaneous Code Status: DNR Family Communication: Extensive discussion with patient's daughter and spouse yesterday Disposition Plan: To be determined  Consultants:  Palliative medicine   Procedures:   Antimicrobials:    Subjective: -Continues to complain of lower abdominal pain, overall little better with current medication regimen, oral intake remains poor, breathing overall stable  Objective: Vitals:   08/04/18 1447 08/04/18 1631 08/04/18 2315 08/05/18 0751  BP: 107/70 123/73 122/73 126/85  Pulse: 97 98 79 87  Resp: 16 15 12    Temp: 98 F (36.7 C) 97.8 F (36.6 C) 97.7 F (36.5 C) (!) 97.5 F (36.4 C)  TempSrc: Oral Oral Oral Oral  SpO2: 94% 97% 98% 95%  Weight:      Height:       No intake or output data in the 24 hours ending 08/05/18 1130 Filed Weights   08/02/18 1930 08/03/18 0345  Weight: 49.9 kg 41.4 kg    Examination:  Gen: Extremely cachectic chronically ill female, alert awake oriented to self and only partly to place, cognitive deficits noted HEENT: PERRLA, Neck supple, no JVD Lungs: Poor air movement, few basilar rhonchi CVS: RRR,No Gallops,Rubs or new Murmurs Abd: soft, Non tender, non distended, BS present Extremities:  No edema Skin: Excoriation and inner medial thigh area Psychiatry: Poor judgement and insight     Data Reviewed:    CBC: Recent Labs  Lab 08/02/18 2151 08/04/18 0244 08/04/18 1906 08/05/18 0249  WBC 14.6* 7.5  --  7.9  NEUTROABS 12.0*  --   --   --   HGB 8.2* 6.6* 11.6* 10.5*  HCT 29.6* 22.9* 37.3 34.2*  MCV 85.1 84.8  --  83.4  PLT 364 243  --  786   Basic Metabolic Panel: Recent Labs  Lab 08/02/18 2151 08/02/18 2352 08/04/18 0810 08/05/18 0249  NA 131*  --  135 135  K 2.8*  --  3.6 3.8  CL 90*  --  97* 95*  CO2 26  --  30 32  GLUCOSE 98  --  127* 99  BUN 15  --  11 10  CREATININE 0.68  --  0.54 0.42*  CALCIUM 8.7*  --  8.6* 8.6*  MG  --  1.5*  --   --    GFR: Estimated Creatinine Clearance: 43.4 mL/min (A) (by C-G formula based on SCr of 0.42 mg/dL (L)). Liver Function Tests: Recent Labs  Lab 08/02/18 2151  AST 10*  ALT 6  ALKPHOS 50  BILITOT 2.0*  PROT 6.6  ALBUMIN 3.1*   No results for input(s): LIPASE, AMYLASE in the last 168 hours. No results for input(s): AMMONIA in the last 168 hours. Coagulation Profile: Recent Labs  Lab 08/03/18 0123  INR 1.18   Cardiac Enzymes: No results for input(s): CKTOTAL, CKMB, CKMBINDEX, TROPONINI in the last 168 hours. BNP (last 3 results) No results for input(s): PROBNP in the last 8760 hours. HbA1C: No results for input(s): HGBA1C in the last 72 hours. CBG: No results for input(s): GLUCAP in the last 168 hours. Lipid Profile: No results for input(s): CHOL, HDL, LDLCALC, TRIG, CHOLHDL, LDLDIRECT in the last 72 hours. Thyroid Function Tests: No results for input(s): TSH, T4TOTAL, FREET4, T3FREE, THYROIDAB in the last 72 hours. Anemia Panel: Recent Labs    08/03/18 0123 08/03/18 0124  VITAMINB12 442  --   FOLATE  --  6.4  FERRITIN 55  --   TIBC 195*  --   IRON 18*  --   RETICCTPCT 4.7*  --    Urine analysis:    Component Value Date/Time   COLORURINE RED (A) 04/03/2015 1453   APPEARANCEUR TURBID (A) 04/03/2015 1453   LABSPEC 1.034 (H) 04/03/2015 1453   PHURINE 5.0 04/03/2015 1453   GLUCOSEU NEGATIVE 04/03/2015  1453   HGBUR LARGE (A) 04/03/2015 1453   BILIRUBINUR LARGE (A) 04/03/2015 1453   KETONESUR NEGATIVE 04/03/2015 1453   PROTEINUR >300 (A) 04/03/2015 1453   UROBILINOGEN 1.0 04/03/2015 1453   NITRITE POSITIVE (A) 04/03/2015 1453   LEUKOCYTESUR MODERATE (A) 04/03/2015 1453   Sepsis Labs: @LABRCNTIP (procalcitonin:4,lacticidven:4)  ) Recent Results (from the past 240 hour(s))  Blood culture (routine x 2)     Status: None (Preliminary result)   Collection Time: 08/02/18 11:30 PM  Result Value Ref Range Status   Specimen Description BLOOD RIGHT ARM  Final   Special Requests   Final    BOTTLES DRAWN AEROBIC AND ANAEROBIC Blood Culture adequate volume   Culture   Final    NO GROWTH 2 DAYS Performed at [facility], [place] [address] [place], [place] [address]    Report Status PENDING  Incomplete  Blood culture (routine x 2)     Status: None (Preliminary result)  Collection Time: 08/02/18 11:57 PM  Result Value Ref Range Status   Specimen Description BLOOD LEFT HAND  Final   Special Requests   Final    BOTTLES DRAWN AEROBIC ONLY Blood Culture adequate volume   Culture   Final    NO GROWTH 2 DAYS Performed at [facility], [address], [place], [place] [address]    Report Status PENDING  Incomplete         Radiology Studies: No results found.      Scheduled Meds: . sodium chloride   Intravenous Once  . azithromycin  500 mg Oral QHS  . feeding supplement (ENSURE ENLIVE)  237 mL Oral BID BM  . fentaNYL  25 mcg Transdermal Q72H  . heparin  5,000 Units Subcutaneous Q8H  .  HYDROmorphone (DILAUDID) injection  1 mg Intravenous Q4H  . LORazepam  0.5 mg Intravenous Once  . metoprolol tartrate  5 mg Intravenous Once   Continuous Infusions: . cefTRIAXone (ROCEPHIN)  IV 1 g (08/04/18 2227)     LOS: 2 days    Time spent: 1min    Domenic Polite, MD [facility] Page via [email or web address], password [email or web address] After 7PM please contact  night-coverage  08/05/2018, 11:30 AM

## 2018-08-05 NOTE — Clinical Social Work Note (Signed)
CSW spoke with Cassandra with [facility] and she anticipates a bed for pt tomorrow. Vito Backers is calling pt's spouse to go over paperwork.   [place], [place]

## 2018-08-05 NOTE — Progress Notes (Addendum)
Palliative Medicine RN Note: Follow up. Per [facility], they have not rec'd referral. I spoke with Cassandra & gave them the referral. They have Epic access and will review the chart.  Marjie Skiff Geri Hepler, RN, BSN, [facility] Palliative Medicine Team 08/05/2018 10:22 AM Office [phone]   ADDENDUM: I saw and spoke with the patient and her family. She is using hydromorphone about every 4 hours on top of Percocet. She reports she is still hurting and that IV pain meds work better than PO. She is tense, uncomfortable. Doesn't rate her pain, but says, "It hurts." I obtained medication adjustments from Dr Hilma Favors. Daughter Abigail Butts requests call when we find out about acceptance at hospice.  Marjie Skiff Marylyn Appenzeller, RN, BSN, [facility] Palliative Medicine Team 08/05/2018 10:34 AM Office [phone]

## 2018-08-05 NOTE — Progress Notes (Signed)
Palliative Medicine RN Note: afternoon follow up.  Patient remains awake and complaining of pain despite multiple doses of IV dilaudid. She is still having significant pain during cleaning/bathing/peri care. We discussed this, and she would prefer to be asleep for this, as that is the only way to control her pain. She is still continuously dumping stool/urine/fluid from her vagina.  She is also having itching all over; her skin is very dry.  I spoke with Dr Hilma Favors. She initiated dilaudid infusion, as well as medications for anxiety and itchy skin. She ordered a larger dose of hydromorphone to give before peri care/changes, as Mrs Rohlfs has significant pain when cleaned (she will refuse care due to pain, despite being covered in urine and stool).   I updated her daughter and spoke w RN Kristi. Plan for PMT follow up tomorrow am.  Marjie Skiff. Chelsee Hosie, RN, BSN, [facility] Palliative Medicine Team 08/05/2018 3:34 PM Office [phone]

## 2018-08-05 NOTE — Progress Notes (Signed)
Palliative Medicine RN Note: Followed up with [facility]. Mrs Mcclaskey has been accepted, but they do not know yet if she will have a bed today. I updated daughter Abigail Butts [phone]).  Marjie Skiff Hanan Mcwilliams, RN, BSN, [facility] Palliative Medicine Team 08/05/2018 1:24 PM Office [phone]

## 2018-08-05 NOTE — Progress Notes (Signed)
Patient refusing to allow staff to clean her bottom due to pain.Patient medicated and will attempt to clean at later time.

## 2018-08-05 NOTE — Progress Notes (Signed)
PT Cancellation Note  Patient Details Name: ENSLIE SAHOTA MRN: [id number] DOB: [birth year]   Cancelled Treatment:    Reason Eval/Treat Not Completed: Other (comment) Noted PT orders had been cancelled by attending MD. Will sign off secondary to orders being cancelled- if patient is in need of skilled PT eval/interventions, please reorder therapy services.    Deniece Ree PT, DPT, CBIS  Supplemental Physical Therapist [facility]    Pager [phone] Acute Rehab Office [phone]

## 2018-08-05 NOTE — Progress Notes (Signed)
OT Cancellation Note  Patient Details Name: Beth Meyers MRN: [id number] DOB: [birth year]   Cancelled Treatment:    Reason Eval/Treat Not Completed: Other (comment); noted pt to have discontinued/cancelled OT orders. Acute OT to sign off at this time. Please re-consult if pt's needs change.   Lou Cal, OT Supplemental Rehabilitation Services Pager [phone] Office [place] 08/05/2018, 12:45 PM

## 2018-08-05 NOTE — Progress Notes (Signed)
   08/05/18 1106  Clinical Encounter Type  Visited With Patient  Visit Type Initial  Referral From Palliative care team  Consult/Referral To Chaplain  The chaplain responded to PMT referral for Pt. spiritual care.  After the chaplain's introduction, the Pt. declined a spiritual care visit.  The chaplain extended an invitation to stop by another time, the Pt. stated "I'm fine."  The chaplain will check the chart and consult with PMT, for possible future intersections.

## 2018-08-06 DIAGNOSIS — J189 Pneumonia, unspecified organism: Secondary | ICD-10-CM

## 2018-08-06 DIAGNOSIS — J441 Chronic obstructive pulmonary disease with (acute) exacerbation: Secondary | ICD-10-CM

## 2018-08-06 LAB — CBC
HCT: 34.2 % — ABNORMAL LOW (ref 36.0–46.0)
HEMOGLOBIN: 10.6 g/dL — AB (ref 12.0–15.0)
MCH: 25.9 pg — ABNORMAL LOW (ref 26.0–34.0)
MCHC: 31 g/dL (ref 30.0–36.0)
MCV: 83.6 fL (ref 80.0–100.0)
Platelets: 185 10*3/uL (ref 150–400)
RBC: 4.09 MIL/uL (ref 3.87–5.11)
RDW: 17.9 % — ABNORMAL HIGH (ref 11.5–15.5)
WBC: 6.4 10*3/uL (ref 4.0–10.5)
nRBC: 0 % (ref 0.0–0.2)

## 2018-08-06 LAB — BASIC METABOLIC PANEL
Anion gap: 8 (ref 5–15)
BUN: 7 mg/dL — ABNORMAL LOW (ref 8–23)
CO2: 31 mmol/L (ref 22–32)
Calcium: 9 mg/dL (ref 8.9–10.3)
Chloride: 94 mmol/L — ABNORMAL LOW (ref 98–111)
Creatinine, Ser: 0.51 mg/dL (ref 0.44–1.00)
GFR calc Af Amer: >60 mL/min (ref >60)
GFR calc non Af Amer: >60 mL/min (ref >60)
Glucose, Bld: 91 mg/dL (ref 70–99)
Potassium: 3.4 mmol/L — ABNORMAL LOW (ref 3.5–5.1)
Sodium: 133 mmol/L — ABNORMAL LOW (ref 135–145)

## 2018-08-06 MED ORDER — FENTANYL 25 MCG/HR TD PT72: 1.0000 | MEDICATED_PATCH | TRANSDERMAL | Status: DC

## 2018-08-06 MED ORDER — HYDROMORPHONE BOLUS VIA INFUSION
2.0000 mg | INTRAVENOUS | Status: DC | PRN
  Administered 2018-08-07: 2 mg via INTRAVENOUS
  Filled 2018-08-06: qty 2

## 2018-08-06 NOTE — Plan of Care (Signed)
  Problem: Skin Integrity: Goal: Risk for impaired skin integrity will decrease Outcome: Progressing   Patient educated not to itch or scratch to avoid skin breakdown.  Educated patient to call out for lotion application or prn medication.  Patient verbalized understanding, needs reinforcement.

## 2018-08-06 NOTE — Progress Notes (Signed)
Palliative Medicine RN Note: Saw patient with Dr Domingo Cocking. Pt is anxious; RN will give prn Ativan. Plan to follow up later today to ensure comfort.  Marjie Skiff Summerlynn Glauser, RN, BSN, [facility] Palliative Medicine Team 08/06/2018 1:46 PM Office [phone]

## 2018-08-06 NOTE — Progress Notes (Signed)
Palliative Medicine RN Note: Symptom follow up.  Patient is resting in bed, snoring, with relaxed breathing. No s/s distress. I updated her daughter and plan on f/u in the morning.  Marjie Skiff Sammy Cassar, RN, BSN, [facility] Palliative Medicine Team 08/06/2018 3:15 PM Office [phone]

## 2018-08-06 NOTE — Care Management Important Message (Signed)
Important Message  Patient Details  Name: Beth Meyers MRN: [id number] Date of Birth: 03-Sep-1950   Medicare Important Message Given:  Yes    [name] 08/06/2018, 3:29 PM

## 2018-08-06 NOTE — Progress Notes (Signed)
Palliative care progress note  Reason for consult: Goals of care, pain management, non-pain symptom management, disposition   Chart reviewed and discussed with Beth Meyers (PMT RN who has been following Beth Meyers).  I saw and examined Beth Meyers in conjunction with Nurse Henslee.  Her husband and 2 nieces were at the bedside.  She had been having uncontrolled pain this AM, but she appears to be in less pain this afternoon following increase in basal rate for dilaudid infusion.  She does remain anxious and has periods of waning mental ability.  Discussed options with family, including consideration for PCA, but she appears to me to be too confused to consider this at this point.  Nursing notes and vitals reviewed. General: Alert, awake, with intermittent periods of confusion and staring.  Restless and anxious but no significant distress.  HEENT: no JVD Lungs: Mildly labored respirations Abdomen: Nondistended Ext: No significant edema Skin: Warm and dry Neuro: Confused  - Continue same regimen for now.  Her infusion rate was increased this AM and someone from PMT will reassess this afternoon to ensure that she appears comfortable after it reaches steady state.  Continue to be aggressive with breakthrough dilaudid and as needed benzos, particularly before bedding and care changes.   - Continue to assess daily for stability to transition to residential hospice if bed becomes available. - Discussed with husband outside the room at his request as well.  Answered all questions to the best of my ability.  No further questions at this time.  Total time: 20 minutes Greater than 50%  of this time was spent counseling and coordinating care related to the above assessment and plan.  Micheline Rough, MD [place] Team [phone]

## 2018-08-06 NOTE — Progress Notes (Addendum)
Palliative Medicine RN Note: Morning symptom check. Pt has eaten breakfast.  Resp rate around 26 with tense, tight breathing. She does not rate her pain, but does say that she is hurting. She reports that the pain is better today, but she will not engage in further questioning.   Blinds closed and lights turned off at her request.  I attempted to give a prn bolus dose of hydromorphone, but there was note enough left in the bag. RN will order a new bag; I confirmed that there is an order in Epic for her to be able to pull a dose from the [facility] (and not have to wait on the bag from pharmacy).   Mrs Pick did not receive prn doses of hydromorphone or midazolam as ordered last night.  Dr Hilma Favors adjusted Dilaudid infusion. I will follow up this afternoon.  Marjie Skiff Geoffrey Mankin, RN, BSN, [facility] Palliative Medicine Team 08/06/2018 9:37 AM Office [phone]  ADDENDUM: Thomas Hoff Cassandra with [facility]; pt does not have a bed today.  Marjie Skiff Jaydrian Corpening, RN, BSN, [facility] Palliative Medicine Team 08/06/2018 2:04 PM Office [phone]

## 2018-08-06 NOTE — Progress Notes (Signed)
Pt remains in no apparent distress at this time as remains from iv administration per orders. Pt offered dinner tray and this nurse assisted by cutting up patient food and attempting to offer patient. Patient refused at this time and did not want tray. Pt has no c/o pain, anxiety or any other stressors as noted. Pt remains safe in the bed, with call bell in reach. Fall protocol intact.

## 2018-08-06 NOTE — Progress Notes (Signed)
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 -She has noticed both stool and urine draining from her vagina, stool and urine for 2 years, worsening drainage recently and pain  hence presented to the emergency room. -In the ED she was noted to have hyponatremia, hypokalemia, anemia, mild leukocytosis, CT abdomen pelvis noted marked masslike thickening of the sigmoid colon with fistulous communication with the bladder worsened since prior study and fistula between sigmoid mass and the uterus/vagina both these findings progressed since prior examination.  Comparison CT was 09/2016 at outside hospital -In addition CT also noted bilateral lower lobe consolidations: -Palliative medicine consult completed, plan for Hospice  Assessment & Plan:   Sepsis, bilateral lower lobe pneumonia -In the background of advanced COPD -Recently treated with ceftriaxone and azithromycin, clinically stabilized and improved -Transition to oral cefdinir, stable, blood cultures are negative  Worsening tumor burden, likely from Colon CA, less likely bladder -Patient reports that this was diagnosed 2 to 3 years ago, she did not pursue any further treatment or work-up, patient reports not seeing an oncologist since original diagnosis about 2 years ago -She was not felt to be a good surgical candidate in 2018 by urology Dr. Gaynelle Arabian per patient's report -Worsening fistulas, both colovesical and colovaginal, with stool mixed with urine and, ongoing seepage of stool  -Worsening debility, malnutrition, failure to thrive with progressing cancer -Discussed overall very poor prognosis  with patient's daughter and spouse on multiple occasions -Currently on Dilaudid drip, in addition getting boluses and PRN Versed -Plan for residential hospice, social work consulted  Cognitive deficits -Dementia suspected, could be alcohol induced from long history of alcoholism  Hypokalemia, hypomagnesemia -Replaced  Acute on chronic iron deficiency anemia -Due to progression of colon cancer, worsened by hemodilution, of IV fluids discontinued -Globin trended down to 6.6 yesterday was given 2 units of PRBC, it is 10.4 now -Comfort focused care  Severe protein calorie malnutrition -Supplements as tolerated  Tobacco abuse/COPD  Alcohol abuse -In remission  DVT prophylaxis: Heparin subcutaneous Code Status: DNR Family Communication: Status post extensive discussions with patient's daughter and spouse Disposition Plan: Plan for residential hospice  Consultants:  Palliative medicine   Procedures:   Antimicrobials:    Subjective: -Feels better on current regimen of pain medications and Dilaudid drip  Objective: Vitals:   08/04/18 2315 08/05/18 0751 08/05/18 2345 08/06/18 1157  BP: 122/73 126/85 138/82 109/70  Pulse: 79 87 94 (!) 113  Resp: 12  15 (!) 22  Temp: 97.7 F (36.5 C) (!) 97.5 F (36.4 C) 98.3 F (36.8 C)   TempSrc: Oral Oral Oral   SpO2: 98% 95% 94% (!) 89%  Weight:      Height:        Intake/Output Summary (Last 24 hours) at 08/06/2018 1334 Last data filed at 08/06/2018 0000 Gross per 24 hour  Intake 12.15 ml  Output -  Net 12.15 ml   Filed Weights   08/02/18 1930 08/03/18 0345  Weight: 49.9 kg 41.4 kg    Examination:  Gen: Extremely cachectic, chronically ill female, alert awake oriented to self and partly to place only HEENT: PERRLA, Neck supple, no JVD Lungs: Poor air movement bilaterally,  diminished breath sounds at both bases CVS: S1-S2/tachycardic  abd: Soft, mildly distended, nontender, bowel sounds present Extremities: No  edema Skin: Extensive excoriations bilaterally and in the medial thigh region Very poor judgment and insight  Data Reviewed:   CBC: Recent Labs  Lab 08/02/18 2151 08/04/18 0244 08/04/18 1906 08/05/18 0249 08/06/18 0207  WBC 14.6* 7.5  --  7.9 6.4  NEUTROABS 12.0*  --   --   --   --   HGB 8.2* 6.6* 11.6* 10.5* 10.6*  HCT 29.6* 22.9* 37.3 34.2* 34.2*  MCV 85.1 84.8  --  83.4 83.6  PLT 364 243  --  203 294   Basic Metabolic Panel: Recent Labs  Lab 08/02/18 2151 08/02/18 2352 08/04/18 0810 08/05/18 0249 08/06/18 0207  NA 131*  --  135 135 133*  K 2.8*  --  3.6 3.8 3.4*  CL 90*  --  97* 95* 94*  CO2 26  --  30 32 31  GLUCOSE 98  --  127* 99 91  BUN 15  --  11 10 7*  CREATININE 0.68  --  0.54 0.42* 0.51  CALCIUM 8.7*  --  8.6* 8.6* 9.0  MG  --  1.5*  --   --   --    GFR: Estimated Creatinine Clearance: 43.4 mL/min (by C-G formula based on SCr of 0.51 mg/dL). Liver Function Tests: Recent Labs  Lab 08/02/18 2151  AST 10*  ALT 6  ALKPHOS 50  BILITOT 2.0*  PROT 6.6  ALBUMIN 3.1*   No results for input(s): LIPASE, AMYLASE in the last 168 hours. No results for input(s): AMMONIA in the last 168 hours. Coagulation Profile: Recent Labs  Lab 08/03/18 0123  INR 1.18   Cardiac Enzymes: No results for input(s): CKTOTAL, CKMB, CKMBINDEX, TROPONINI in the last 168 hours. BNP (last 3 results) No results for input(s): PROBNP in the last 8760 hours. HbA1C: No results for input(s): HGBA1C in the last 72 hours. CBG: No results for input(s): GLUCAP in the last 168 hours. Lipid Profile: No results for input(s): CHOL, HDL, LDLCALC, TRIG, CHOLHDL, LDLDIRECT in the last 72 hours. Thyroid Function Tests: No results for input(s): TSH, T4TOTAL, FREET4, T3FREE, THYROIDAB in the last 72 hours. Anemia Panel: No results for input(s): VITAMINB12, FOLATE, FERRITIN, TIBC, IRON, RETICCTPCT in the last 72 hours. Urine analysis:    Component Value Date/Time   COLORURINE RED (A)  04/03/2015 1453   APPEARANCEUR TURBID (A) 04/03/2015 1453   LABSPEC 1.034 (H) 04/03/2015 1453   PHURINE 5.0 04/03/2015 1453   GLUCOSEU NEGATIVE 04/03/2015 1453   HGBUR LARGE (A) 04/03/2015 1453   BILIRUBINUR LARGE (A) 04/03/2015 1453   KETONESUR NEGATIVE 04/03/2015 1453   PROTEINUR >300 (A) 04/03/2015 1453   UROBILINOGEN 1.0 04/03/2015 1453   NITRITE POSITIVE (A) 04/03/2015 1453   LEUKOCYTESUR MODERATE (A) 04/03/2015 1453   Sepsis Labs: @LABRCNTIP (procalcitonin:4,lacticidven:4)  ) Recent Results (from the past 240 hour(s))  Blood culture (routine x 2)     Status: None (Preliminary result)   Collection Time: 08/02/18 11:30 PM  Result Value Ref Range Status   Specimen Description BLOOD RIGHT ARM  Final   Special Requests   Final    BOTTLES DRAWN AEROBIC AND ANAEROBIC Blood Culture adequate volume   Culture   Final    NO GROWTH 3 DAYS Performed at [facility], [place] [address], [place], [place] [address]    Report Status PENDING  Incomplete  Blood culture (routine x 2)  Status: None (Preliminary result)   Collection Time: 08/02/18 11:57 PM  Result Value Ref Range Status   Specimen Description BLOOD LEFT HAND  Final   Special Requests   Final    BOTTLES DRAWN AEROBIC ONLY Blood Culture adequate volume   Culture   Final    NO GROWTH 3 DAYS Performed at [facility], [address] [place], [place] [address]    Report Status PENDING  Incomplete         Radiology Studies: No results found.      Scheduled Meds: . sodium chloride   Intravenous Once  . cefdinir  300 mg Oral Q12H  . feeding supplement (ENSURE ENLIVE)  237 mL Oral BID BM  . fentaNYL  25 mcg Transdermal Q72H  . LORazepam  0.5 mg Intravenous Once  . metoprolol tartrate  5 mg Intravenous Once   Continuous Infusions: . HYDROmorphone 2 mg/hr (08/06/18 0940)     LOS: 3 days    Time spent: 39min    Domenic Polite, MD [facility] Page via [email or web address], password [email or web address] After  7PM please contact night-coverage  08/06/2018, 1:34 PM

## 2018-08-07 MED ORDER — LORAZEPAM 2 MG/ML IJ SOLN
1.0000 mg | INTRAMUSCULAR | Status: DC | PRN
  Filled 2018-08-07: qty 1

## 2018-08-07 MED ORDER — ACETAMINOPHEN 325 MG PO TABS: 650.0000 mg | ORAL_TABLET | Freq: Four times a day (QID) | ORAL | Status: DC | PRN

## 2018-08-07 MED ORDER — ACETAMINOPHEN 650 MG RE SUPP: 650.0000 mg | Freq: Four times a day (QID) | RECTAL | Status: DC | PRN

## 2018-08-07 MED ORDER — KETOROLAC TROMETHAMINE 15 MG/ML IJ SOLN
15.0000 mg | Freq: Three times a day (TID) | INTRAMUSCULAR | Status: DC
  Administered 2018-08-07 (×2): 15 mg via INTRAVENOUS
  Filled 2018-08-07 (×2): qty 1

## 2018-08-07 MED ORDER — LORAZEPAM 2 MG/ML IJ SOLN: 1.0000 mg | Freq: Four times a day (QID) | INTRAMUSCULAR | 0 refills | Status: AC

## 2018-08-07 MED ORDER — DIPHENHYDRAMINE HCL 50 MG/ML IJ SOLN: 12.5000 mg | INTRAMUSCULAR | Status: DC | PRN

## 2018-08-07 MED ORDER — HYDROMORPHONE BOLUS VIA INFUSION: 2.0000 mg | INTRAVENOUS | 0 refills | Status: AC | PRN

## 2018-08-07 MED ORDER — ONDANSETRON 4 MG PO TBDP: 4.0000 mg | ORAL_TABLET | Freq: Four times a day (QID) | ORAL | Status: DC | PRN

## 2018-08-07 MED ORDER — BIOTENE DRY MOUTH MT LIQD: 15.0000 mL | OROMUCOSAL | Status: DC | PRN

## 2018-08-07 MED ORDER — LORAZEPAM 2 MG/ML IJ SOLN: 1.0000 mg | INTRAMUSCULAR | Status: DC | PRN

## 2018-08-07 MED ORDER — MIDAZOLAM HCL 2 MG/2ML IJ SOLN: 2.0000 mg | INTRAMUSCULAR | 0 refills | Status: AC | PRN

## 2018-08-07 MED ORDER — POLYVINYL ALCOHOL 1.4 % OP SOLN
1.0000 [drp] | Freq: Four times a day (QID) | OPHTHALMIC | Status: DC | PRN
  Filled 2018-08-07: qty 15

## 2018-08-07 MED ORDER — HALOPERIDOL LACTATE 2 MG/ML PO CONC
2.0000 mg | ORAL | Status: DC | PRN
  Filled 2018-08-07: qty 1

## 2018-08-07 MED ORDER — ONDANSETRON HCL 4 MG/2ML IJ SOLN: 4.0000 mg | Freq: Four times a day (QID) | INTRAMUSCULAR | Status: DC | PRN

## 2018-08-07 MED ORDER — HALOPERIDOL LACTATE 5 MG/ML IJ SOLN: 2.0000 mg | INTRAMUSCULAR | Status: DC | PRN

## 2018-08-07 MED ORDER — HALOPERIDOL 1 MG PO TABS: 2.0000 mg | ORAL_TABLET | ORAL | Status: DC | PRN

## 2018-08-07 MED ORDER — SODIUM CHLORIDE 0.9 % IV SOLN: 4.0000 mg/h | INTRAVENOUS | Status: AC

## 2018-08-07 MED ORDER — LORAZEPAM 2 MG/ML IJ SOLN
1.0000 mg | Freq: Four times a day (QID) | INTRAMUSCULAR | Status: DC
  Administered 2018-08-07 (×2): 1 mg via INTRAVENOUS
  Filled 2018-08-07: qty 1

## 2018-08-07 MED ORDER — FENTANYL 25 MCG/HR TD PT72: 1.0000 | MEDICATED_PATCH | TRANSDERMAL | 0 refills | Status: AC

## 2018-08-07 MED FILL — Fentanyl TD Patch 72HR 25 MCG/HR: TRANSDERMAL | Qty: 1 | Status: AC

## 2018-08-07 NOTE — Progress Notes (Signed)
At discharge, Dilaudid drip discontinued.  Remaining 34.5 ML in bag/tubing wasted in stericycle with Langston Masker, RN as witness.

## 2018-08-07 NOTE — Progress Notes (Addendum)
Pt discharged to hospice via [facility].  PRN meds administered for comfort prior to d/c.  At transfer from bed to stretcher, pt opened eyes, no moaning noted.  Appeared comfortable.  D/c'd with PIV in place to right arm.  Spoke to Santiago Glad, Therapist, sports at hospice to make aware of discharge. Pt left with belongings bag, as well as gold colored earrings (2 pair), gold colored rings, and gold colored anklet.    Late Entry - Made Santiago Glad, RN aware that per [facility] review, pt to have fentanyl patch in place to RLQ.

## 2018-08-07 NOTE — Progress Notes (Signed)
Palliative Medicine RN Note: Spoke w Brenda. No bed available at this time; pt is still at the top of the waitlist.  Marjie Skiff. Emberli Ballester, RN, BSN, [facility] Palliative Medicine Team 08/07/2018 10:02 AM Office [phone]

## 2018-08-07 NOTE — Progress Notes (Signed)
RN gave report to Magdalen Spatz at [facility] and night shift RN Cyril Mourning is aware of pt's pending discharge from hospital tonight.

## 2018-08-07 NOTE — Clinical Social Work Note (Signed)
Clinical Social Worker facilitated patient discharge including contacting patient family and facility to confirm patient discharge plans.  Clinical information faxed to facility and family agreeable with plan.  CSW arranged ambulance transport via [facility] to [facility] .  RN to call [phone] for report prior to discharge.  Clinical Social Worker will sign off for now as social work intervention is no longer needed. Please consult [place] again if new need arises.  [place], [place]

## 2018-08-07 NOTE — Discharge Summary (Signed)
[facility] Discharge Summary   Patient: Beth Meyers:[id number]   PCP: Beth Sacramento, MD DOB: 1950/11/10   Date of admission: 08/02/2018   Date of discharge:  08/07/2018    Discharge Diagnoses:   Principal Problem:   Lobar pneumonia (Beth Meyers) Active Problems:   COPD exacerbation (Beth Meyers)   Hypokalemia   Essential hypertension   Sepsis (Beth Meyers)   Abdominal pain   Normocytic anemia   Alcohol abuse   Protein-calorie malnutrition, severe (Beth Meyers)   Goals of care, counseling/discussion   Palliative care by specialist   Admitted From: home Disposition: Residential hospice  Recommendations for Outpatient Follow-up:  1. Please establish care with residential hospice  Diet recommendation: Comfort.  Activity: The patient is advised to gradually reintroduce usual activities.  Discharge Condition: good  Code Status: DNR/DNI, comfort care on hospice  History of present illness: As per the H and P dictated on admission, " Beth Meyers is a 68 y.o. female with medical history significant of hypertension, COPD, depression, anxiety, alcohol abuse in remission, possible bladder and colon cancer, who presents with shortness breath, cough, abdominal pain, pelvic pain.  Pt is poor historian.  Family reported to ED physician that patient has history of bladder and colon cancer. It was diagnosed by Dr. Gaynelle Meyers, urology, several years ago.Family states that she had a cystoscopy. I did Chart Review, but cannot find clear record for this in Epic. Patient elected not to pursue treatment and essentially hasn't had follow-up. Pt has a chronic abdominal pain, which has worsened recently.  She has pain in lower abdomen, pelvis and vaginal area.  No active nausea, vomiting or diarrhea.  She states she has burning on urination all the time.  No fever or chills.  In the past several days, patient developed cough and shortness of breath.  She does not have chest pain, fever or chills.  She coughs up  clear mucus.  No unilateral weakness. Per ED RN, when she tried to collect urine samples, pt was found to have mixture of urine and feces coming from vagina. "Patient is very excoriated on the outside and inside of her labial folds. Skin protectant placed on outside of folds". "  Hospital Course:  Summary of her active problems in the hospital is as following.  Sepsis, bilateral lower lobe pneumonia -In the background of advanced COPD -Recently treated with ceftriaxone and azithromycin,  -Still febrile, treated with Toradol. Now transition to comfort care.  Worsening tumor burden, likelyfromColon CA, lesslikely bladder -Patient reports that this was diagnosed 2 to 3 years ago,she did not pursue any further treatment or work-up,patient reports not seeing an oncologist since original diagnosis about 2 years ago -She was not felt to be a good surgical candidate in 2018 by urology Dr. Gaynelle Meyers per patient's report -Worsening fistulas,both colovesical and colovaginal,with stool mixed with urine and,ongoing seepage of stool -Worsening debility, malnutrition, failure to thrive with progressing cancer -Discussed overall very poor prognosis with patient's daughter and spouse on multiple occasions -Currently on Dilaudid drip, in addition getting boluses and PRN Versed -Plan for residential hospice, social work consulted  Cognitive deficits -Dementia suspected, could be alcohol induced from long history of alcoholism  Hypokalemia, hypomagnesemia -Replaced  Acute on chronic Beth deficiency anemia -Due to progression of colon cancer, worsened by hemodilution, of IV fluids discontinued -Hemoglobin trended down to 6.6, was given 2 units of PRBC, after that came up to 10.4 -Comfort focused care  Severe protein calorie malnutrition -Supplements as tolerated  Tobacco abuse/COPD  Alcohol abuse -In remission  Body mass index is 16.17 kg/m.  Nutrition Problem: Severe  Malnutrition Etiology: cancer and cancer related treatments(Unaddressed cancer)  On the day of the discharge the patient's vitals were stable , and no other acute medical condition were reported by patient. the patient was felt safe to be discharge at hospice.  Consultants: Palliative care  Procedures: none  DISCHARGE MEDICATION: Allergies as of 08/07/2018      Reactions   Penicillins Hives   .DID THE REACTION INVOLVE: Swelling of the face/tongue/throat, SOB, or low BP? Unknown Sudden or severe rash/hives, skin peeling, or the inside of the mouth or nose? Unknown Did it require medical treatment? Unknown When did it last happen?more than 10 years If all above answers are "NO", may proceed with cephalosporin use.   Rosuvastatin Rash   Unknown      Medication List    STOP taking these medications   phenazopyridine 200 MG tablet Commonly known as:  PYRIDIUM     TAKE these medications   fentaNYL 25 MCG/HR Commonly known as:  [place] 1 patch onto the skin every 3 (three) days. Start taking on:  August 09, 2018   HYDROmorphone 2 mg/mL Soln Commonly known as:  DILAUDID Inject 2 mg into the vein as needed (10 minutes before personal care).   HYDROmorphone 25 mg in sodium chloride 0.9 % 47.5 mL Inject 4 mg/hr into the vein continuous.   LORazepam 2 MG/ML injection Commonly known as:  ATIVAN Inject 0.5 mLs (1 mg total) into the vein every 6 (six) hours.   midazolam 2 MG/2ML Soln injection Commonly known as:  VERSED Inject 2 mLs (2 mg total) into the vein every hour as needed for agitation (and prior to personal care).      Allergies  Allergen Reactions  . Penicillins Hives    .DID THE REACTION INVOLVE: Swelling of the face/tongue/throat, SOB, or low BP? Unknown Sudden or severe rash/hives, skin peeling, or the inside of the mouth or nose? Unknown Did it require medical treatment? Unknown When did it last happen?more than 10 years If all above  answers are "NO", may proceed with cephalosporin use.   . Rosuvastatin Rash    Unknown   Discharge Instructions    Increase activity slowly   Complete by:  As directed      Discharge Exam: Filed Weights   08/02/18 1930 08/03/18 0345  Weight: 49.9 kg 41.4 kg   Vitals:   08/07/18 0641 08/07/18 0812  BP:  97/64  Pulse:  (!) 134  Resp:  (!) 21  Temp: (!) 100.9 F (38.3 C) (!) 102.2 F (39 C)  SpO2:  92%   General: Appear in mild distress, no Rash; Oral Mucosa moist Cardiovascular: S1 and S2 Present, no Murmur, no JVD Respiratory: Bilateral Air entry present and Clear to Auscultation, no Crackles, no wheezes Abdomen: Bowel Sound present, Soft and mild tenderness Extremities: no Pedal edema, no calf tenderness Neurology: Lethargic.  The results of significant diagnostics from this hospitalization (including imaging, microbiology, ancillary and laboratory) are listed below for reference.    Significant Diagnostic Studies: Dg Chest 2 View  Result Date: 08/02/2018 CLINICAL DATA:  Chronic cough EXAM: CHEST - 2 VIEW COMPARISON:  None. FINDINGS: Mild posterior lung base opacity, possible mild pneumonia. No pleural effusion. Normal heart size. Aortic atherosclerosis. Multiple right upper rib fractures old. Multiple mild compression deformities of the mid to lower thoracic spine. IMPRESSION: Mild patchy posterior lung opacity, possibly the right base, may  reflect small pneumonia. Imaging follow-up to resolution is recommended. Electronically Signed   By: Donavan Foil M.D.   On: 08/02/2018 22:31   Ct Abdomen Pelvis W Contrast  Result Date: 08/03/2018 CLINICAL DATA:  Lower abdominal and vaginal pain, question fistula EXAM: CT ABDOMEN AND PELVIS WITH CONTRAST TECHNIQUE: Multidetector CT imaging of the abdomen and pelvis was performed using the standard protocol following bolus administration of intravenous contrast. CONTRAST:  133mL OMNIPAQUE IOHEXOL 300 MG/ML  SOLN COMPARISON:  CT  09/26/2016 FINDINGS: Lower chest: Lung bases demonstrate partial consolidation within the posterior lung bases. No pleural effusion. The heart size is normal. Hepatobiliary: No focal liver abnormality is seen. No gallstones, gallbladder wall thickening, or biliary dilatation. Pancreas: Unremarkable. No pancreatic ductal dilatation or surrounding inflammatory changes. Spleen: Normal in size without focal abnormality. Adrenals/Urinary Tract: Adrenal glands are normal. No hydronephrosis. Gas within the urinary bladder. Irregular wall thickening of the bladder roof and anterior wall of the bladder with inflammatory mass. This can not be separated from irregular masslike thickening of the distal sigmoid colon, findings have progressed since prior study and would be consistent with colovesical fistula. Stomach/Bowel: The stomach is nonenlarged. There is no dilated small bowel. The appendix is negative. Irregular masslike thickening of the sigmoid colon. Vascular/Lymphatic: Nonaneurysmal aorta. Extensive aortic atherosclerosis. No significant adenopathy. Reproductive: Large volume of air within the uterus and vagina with stool 1 material, consistent with fistula. Other: No free air. Musculoskeletal: Chronic compression deformity at T11. Mild superior endplate deformity at L5 with increased Schmorl's node at the superior endplates of L4 and L5. Grade 1 anterolisthesis L3 on L4. IMPRESSION: 1. Marked masslike wall thickening of the sigmoid colon which is contiguous with inflammatory soft tissue mass involving the roof and anterior aspect of the urinary bladder; fluid level within the urinary bladder, consistent with colovesical fistula, worsened since prior study. Now seen is large volume of air and stool material within the uterus and vagina, also consistent with a fistula between the inflammatory sigmoid mass and the uterus and or vagina. Overall the findings have progressed since the prior examination. 2. Partial  consolidations within the posterior lower lobes which may reflect atelectasis, pneumonia, or aspiration Electronically Signed   By: Donavan Foil M.D.   On: 08/03/2018 00:41    Microbiology: Recent Results (from the past 240 hour(s))  Blood culture (routine x 2)     Status: None (Preliminary result)   Collection Time: 08/02/18 11:30 PM  Result Value Ref Range Status   Specimen Description BLOOD RIGHT ARM  Final   Special Requests   Final    BOTTLES DRAWN AEROBIC AND ANAEROBIC Blood Culture adequate volume   Culture   Final    NO GROWTH 4 DAYS Performed at [facility], [address] [place], [place] [address]    Report Status PENDING  Incomplete  Blood culture (routine x 2)     Status: None (Preliminary result)   Collection Time: 08/02/18 11:57 PM  Result Value Ref Range Status   Specimen Description BLOOD LEFT HAND  Final   Special Requests   Final    BOTTLES DRAWN AEROBIC ONLY Blood Culture adequate volume   Culture   Final    NO GROWTH 4 DAYS Performed at [facility], [place] [address] [place], [place] [address]    Report Status PENDING  Incomplete     Labs: CBC: Recent Labs  Lab 08/02/18 2151 08/04/18 0244 08/04/18 1906 08/05/18 0249 08/06/18 0207  WBC 14.6*  7.5  --  7.9 6.4  NEUTROABS 12.0*  --   --   --   --   HGB 8.2* 6.6* 11.6* 10.5* 10.6*  HCT 29.6* 22.9* 37.3 34.2* 34.2*  MCV 85.1 84.8  --  83.4 83.6  PLT 364 243  --  203 465   Basic Metabolic Panel: Recent Labs  Lab 08/02/18 2151 08/02/18 2352 08/04/18 0810 08/05/18 0249 08/06/18 0207  NA 131*  --  135 135 133*  K 2.8*  --  3.6 3.8 3.4*  CL 90*  --  97* 95* 94*  CO2 26  --  30 32 31  GLUCOSE 98  --  127* 99 91  BUN 15  --  11 10 7*  CREATININE 0.68  --  0.54 0.42* 0.51  CALCIUM 8.7*  --  8.6* 8.6* 9.0  MG  --  1.5*  --   --   --    Liver Function Tests: Recent Labs  Lab 08/02/18 2151  AST 10*  ALT 6  ALKPHOS 50  BILITOT 2.0*  PROT 6.6  ALBUMIN 3.1*   No results for  input(s): LIPASE, AMYLASE in the last 168 hours. No results for input(s): AMMONIA in the last 168 hours. Cardiac Enzymes: No results for input(s): CKTOTAL, CKMB, CKMBINDEX, TROPONINI in the last 168 hours. BNP (last 3 results) No results for input(s): BNP in the last 8760 hours. CBG: No results for input(s): GLUCAP in the last 168 hours. Time spent: 35 minutes  Signed:  Berle Mull  [facility]  08/07/2018

## 2018-08-08 LAB — CULTURE, BLOOD (ROUTINE X 2)
Culture: NO GROWTH
Culture: NO GROWTH
Special Requests: ADEQUATE
Special Requests: ADEQUATE

## 2018-08-24 DEATH — deceased

## 2020-11-27 IMAGING — CT CT ABD-PELV W/ CM
2 of 5 series · 16 of 46 positions shown, 18 images · IV contrast (APPLIED)
Comparison: CT 09/26/2016

CLINICAL DATA: Lower abdominal and vaginal pain, question fistula

EXAM:
CT ABDOMEN AND PELVIS WITH CONTRAST
TECHNIQUE: Multidetector CT imaging of the abdomen and pelvis was performed
using the standard protocol following bolus administration of
intravenous contrast.
CONTRAST:  100mL OMNIPAQUE IOHEXOL 300 MG/ML  SOLN

[Series 3: abdomen 5.0 · axial · 0.85mm/px · z∈[+775,+1135]mm · 13 of 84 slices shown, 15 images]
[im 6/84  soft-tissue]
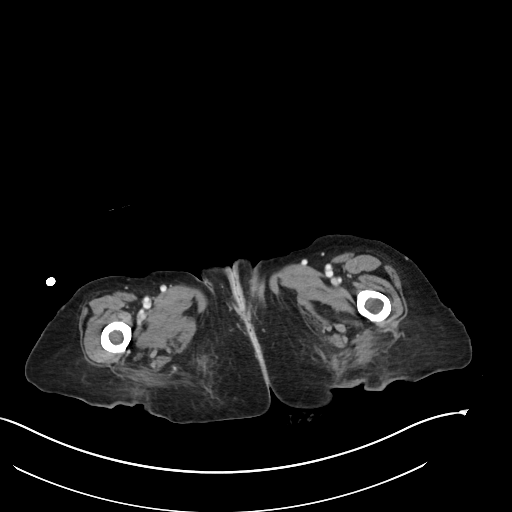
[im 6/84  bone]
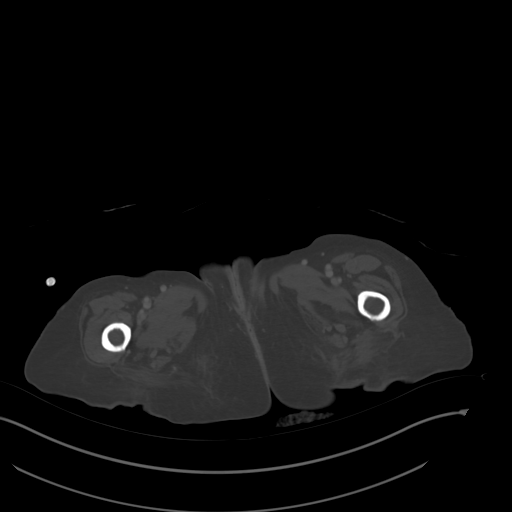
[im 12/84  soft-tissue]
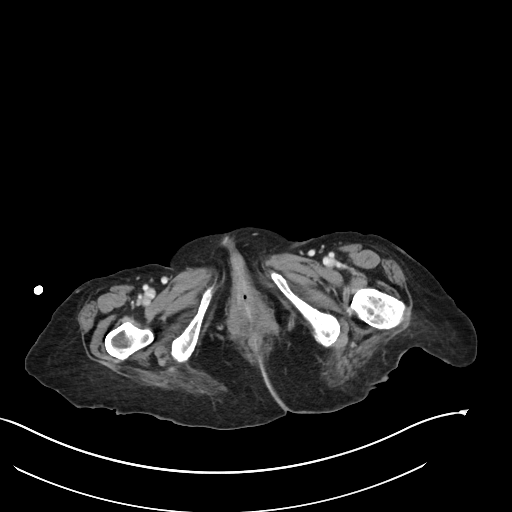
[im 17/84  soft-tissue]
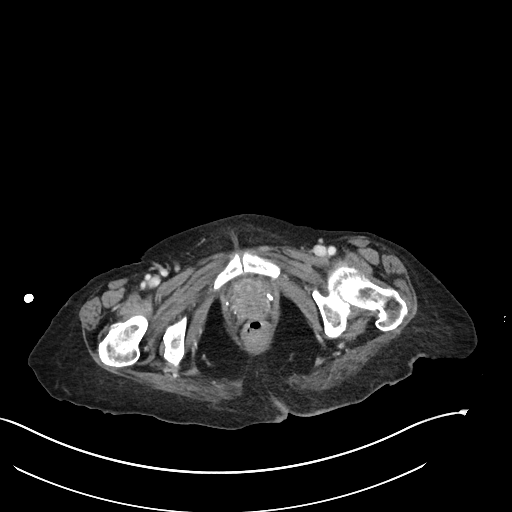
[im 23/84  soft-tissue]
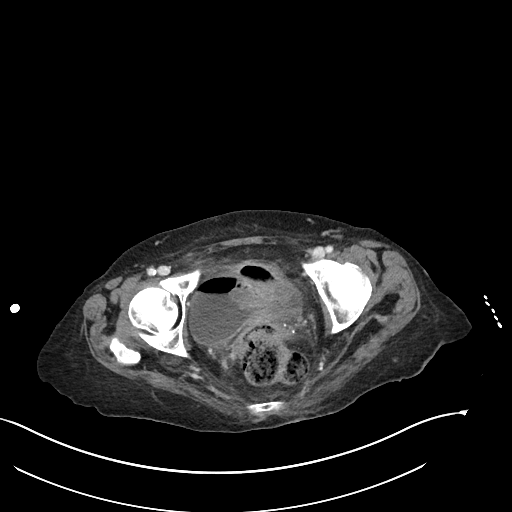
[im 28/84  soft-tissue]
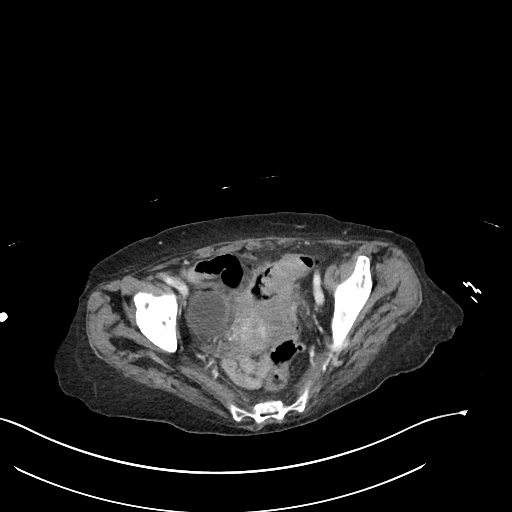
[im 34/84  soft-tissue]
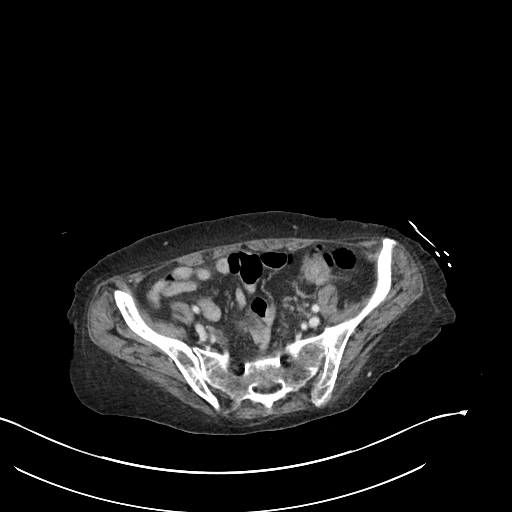
[im 45/84  soft-tissue]
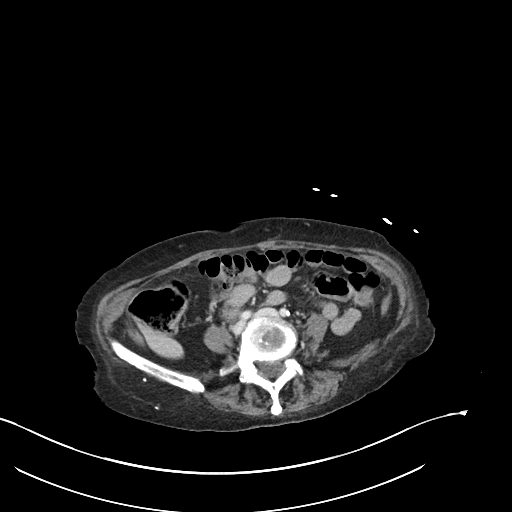
[im 50/84  soft-tissue]
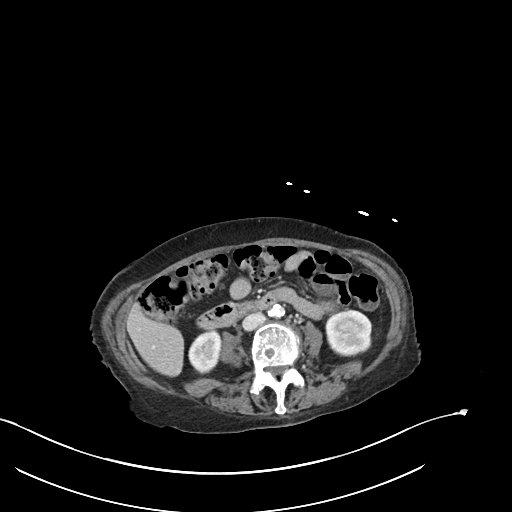
[im 56/84  soft-tissue]
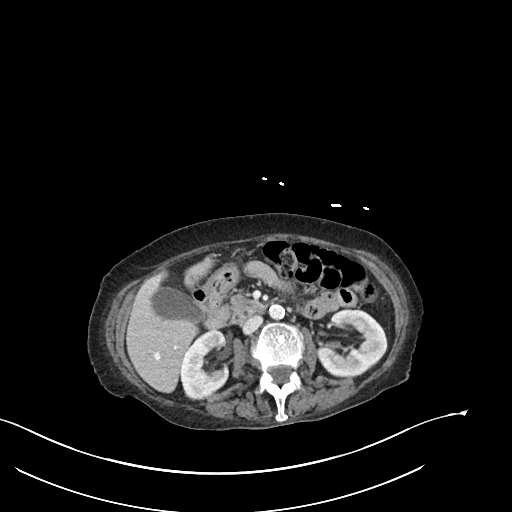
[im 56/84  bone]
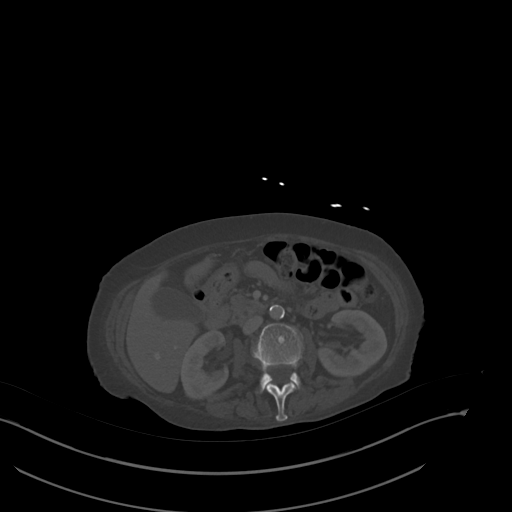
[im 61/84  soft-tissue]
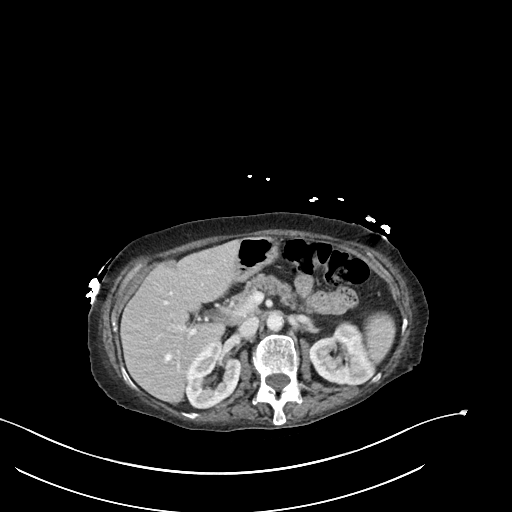
[im 67/84  soft-tissue]
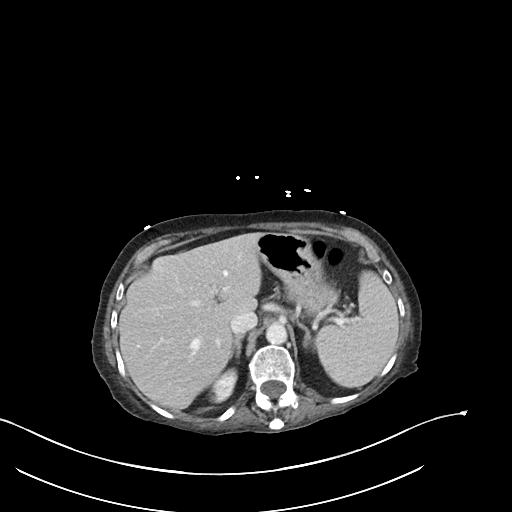
[im 72/84  soft-tissue]
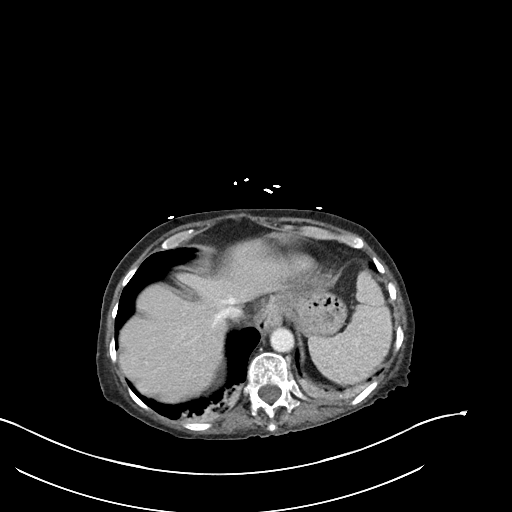
[im 78/84  soft-tissue]
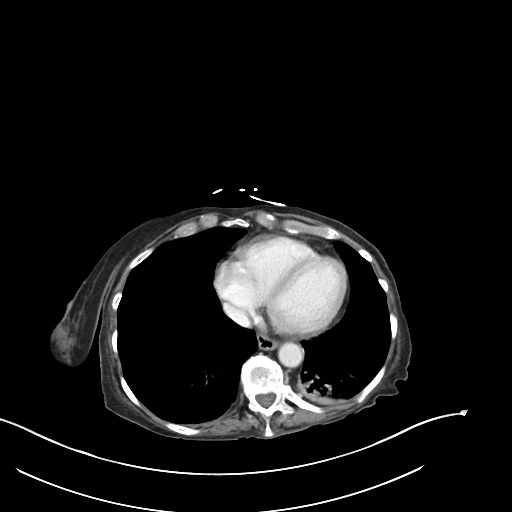

[Series 6: abdomen 3.0 mpr cor · coronal · 0.74mm/px · 3 of 71 slices shown]
[im 24/71  soft-tissue]
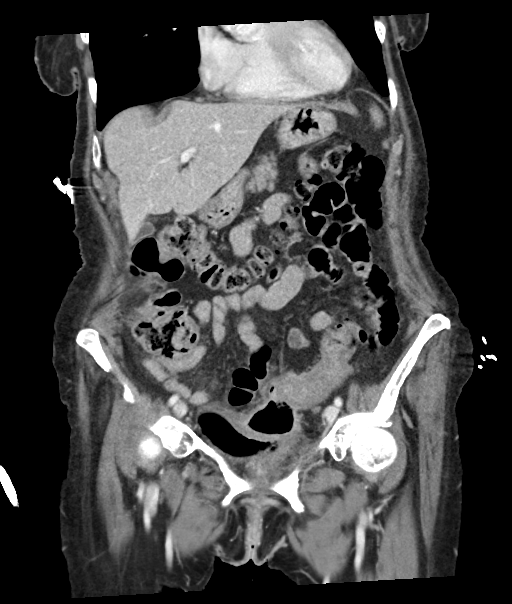
[im 32/71  soft-tissue]
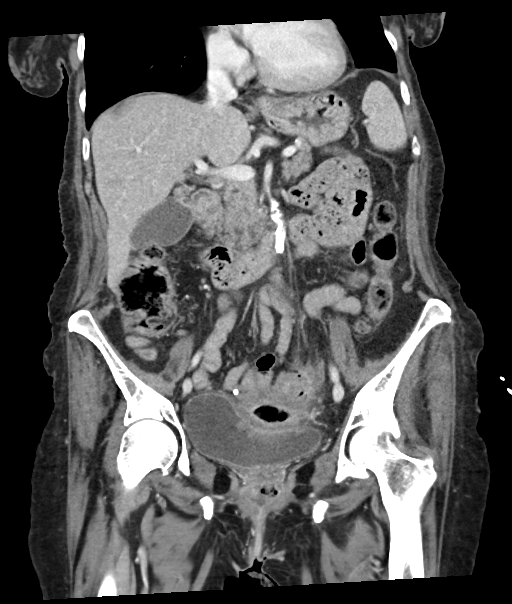
[im 39/71  soft-tissue]
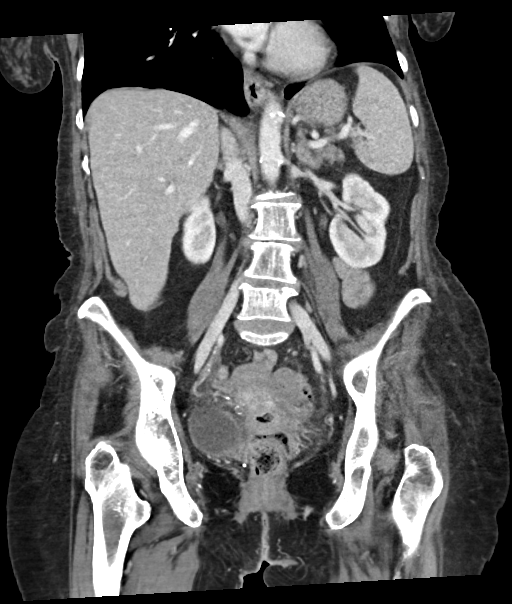

[16 of 46 positions shown; findings below may reference images not displayed]

FINDINGS: Lower chest: Lung bases demonstrate partial consolidation within the
posterior lung bases. No pleural effusion. The heart size is normal.

Hepatobiliary: No focal liver abnormality is seen. No gallstones,
gallbladder wall thickening, or biliary dilatation.

Pancreas: Unremarkable. No pancreatic ductal dilatation or
surrounding inflammatory changes.

Spleen: Normal in size without focal abnormality.

Adrenals/Urinary Tract: Adrenal glands are normal. No
hydronephrosis. Gas within the urinary bladder. Irregular wall
thickening of the bladder roof and anterior wall of the bladder with
inflammatory mass. This can not be separated from irregular masslike
thickening of the distal sigmoid colon, findings have progressed
since prior study and would be consistent with colovesical fistula.

Stomach/Bowel: The stomach is nonenlarged. There is no dilated small
bowel. The appendix is negative. Irregular masslike thickening of
the sigmoid colon.

Vascular/Lymphatic: Nonaneurysmal aorta. Extensive aortic
atherosclerosis. No significant adenopathy.

Reproductive: Large volume of air within the uterus and vagina with
stool 1 material, consistent with fistula.

Other: No free air.

Musculoskeletal: Chronic compression deformity at T11. Mild superior
endplate deformity at L5 with increased Schmorl's node at the
superior endplates of L4 and L5. Grade 1 anterolisthesis L3 on L4.
IMPRESSION: 1. Marked masslike wall thickening of the sigmoid colon which is
contiguous with inflammatory soft tissue mass involving the roof and
anterior aspect of the urinary bladder; fluid level within the
urinary bladder, consistent with colovesical fistula, worsened since
prior study. Now seen is large volume of air and stool material
within the uterus and vagina, also consistent with a fistula between
the inflammatory sigmoid mass and the uterus and or vagina. Overall
the findings have progressed since the prior examination.
2. Partial consolidations within the posterior lower lobes which may
reflect atelectasis, pneumonia, or aspiration
